# Patient Record
Sex: Female | Born: 1990 | Race: Black or African American | Hispanic: No | Marital: Single | Smoking: Current every day smoker
Health system: Southern US, Community
[De-identification: ages and names within clinical notes are randomized; demographics above are authoritative.]

## PROBLEM LIST (undated history)

## (undated) ENCOUNTER — Emergency Department (HOSPITAL_COMMUNITY): Admission: EM | Payer: Medicaid Other | Source: Home / Self Care

## (undated) DIAGNOSIS — F53 Postpartum depression: Secondary | ICD-10-CM

## (undated) DIAGNOSIS — R630 Anorexia: Secondary | ICD-10-CM

## (undated) DIAGNOSIS — K219 Gastro-esophageal reflux disease without esophagitis: Secondary | ICD-10-CM

## (undated) DIAGNOSIS — G43109 Migraine with aura, not intractable, without status migrainosus: Secondary | ICD-10-CM

## (undated) DIAGNOSIS — J309 Allergic rhinitis, unspecified: Secondary | ICD-10-CM

## (undated) DIAGNOSIS — Q892 Congenital malformations of other endocrine glands: Secondary | ICD-10-CM

## (undated) DIAGNOSIS — N83209 Unspecified ovarian cyst, unspecified side: Secondary | ICD-10-CM

## (undated) DIAGNOSIS — E559 Vitamin D deficiency, unspecified: Secondary | ICD-10-CM

## (undated) DIAGNOSIS — D649 Anemia, unspecified: Secondary | ICD-10-CM

## (undated) DIAGNOSIS — F419 Anxiety disorder, unspecified: Secondary | ICD-10-CM

## (undated) DIAGNOSIS — O99345 Other mental disorders complicating the puerperium: Secondary | ICD-10-CM

## (undated) HISTORY — DX: Congenital malformations of other endocrine glands: Q89.2

## (undated) HISTORY — DX: Anorexia: R63.0

## (undated) HISTORY — DX: Vitamin D deficiency, unspecified: E55.9

## (undated) HISTORY — DX: Anemia, unspecified: D64.9

## (undated) HISTORY — DX: Allergic rhinitis, unspecified: J30.9

## (undated) HISTORY — DX: Migraine with aura, not intractable, without status migrainosus: G43.109

## (undated) HISTORY — DX: Unspecified ovarian cyst, unspecified side: N83.209

## (undated) HISTORY — PX: WISDOM TOOTH EXTRACTION: SHX21

## (undated) HISTORY — DX: Gastro-esophageal reflux disease without esophagitis: K21.9

---
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
## 2012-04-15 ENCOUNTER — Emergency Department (HOSPITAL_COMMUNITY)
Admission: EM | Admit: 2012-04-15 | Discharge: 2012-04-16 | Disposition: A | Discharge status: Home / Self Care | Payer: Self-pay | Attending: Emergency Medicine | Admitting: Emergency Medicine

## 2012-04-15 ENCOUNTER — Emergency Department (HOSPITAL_COMMUNITY): Payer: Self-pay

## 2012-04-15 ENCOUNTER — Encounter (HOSPITAL_COMMUNITY): Payer: Self-pay | Admitting: Emergency Medicine

## 2012-04-15 DIAGNOSIS — S139XXA Sprain of joints and ligaments of unspecified parts of neck, initial encounter: Secondary | ICD-10-CM | POA: Insufficient documentation

## 2012-04-15 DIAGNOSIS — Y9389 Activity, other specified: Secondary | ICD-10-CM | POA: Insufficient documentation

## 2012-04-15 DIAGNOSIS — F17200 Nicotine dependence, unspecified, uncomplicated: Secondary | ICD-10-CM | POA: Insufficient documentation

## 2012-04-15 DIAGNOSIS — T07XXXA Unspecified multiple injuries, initial encounter: Secondary | ICD-10-CM | POA: Insufficient documentation

## 2012-04-15 DIAGNOSIS — Y92410 Unspecified street and highway as the place of occurrence of the external cause: Secondary | ICD-10-CM | POA: Insufficient documentation

## 2012-04-15 DIAGNOSIS — M436 Torticollis: Secondary | ICD-10-CM | POA: Insufficient documentation

## 2012-04-15 LAB — CBC WITH DIFFERENTIAL/PLATELET
Basophils Absolute: 0 10*3/uL (ref 0.0–0.1)
Basophils Relative: 0 % (ref 0–1)
HCT: 38.7 % (ref 36.0–46.0)
Lymphocytes Relative: 35 % (ref 12–46)
MCHC: 33.3 g/dL (ref 30.0–36.0)
Monocytes Absolute: 0.5 10*3/uL (ref 0.1–1.0)
Neutro Abs: 3.9 10*3/uL (ref 1.7–7.7)
Platelets: 182 10*3/uL (ref 150–400)
RDW: 14.8 % (ref 11.5–15.5)
WBC: 7.4 10*3/uL (ref 4.0–10.5)

## 2012-04-15 LAB — POCT I-STAT, CHEM 8
Calcium, Ion: 1.27 mmol/L — ABNORMAL HIGH (ref 1.12–1.23)
Chloride: 108 mEq/L (ref 96–112)
HCT: 40 % (ref 36.0–46.0)
Sodium: 144 mEq/L (ref 135–145)
TCO2: 26 mmol/L (ref 0–100)

## 2012-04-15 LAB — POCT PREGNANCY, URINE: Preg Test, Ur: NEGATIVE
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
## 2012-04-16 MED ORDER — HYDROCODONE-ACETAMINOPHEN 5-325 MG PO TABS
1.0000 | ORAL_TABLET | Freq: Once | ORAL | Status: AC
  Administered 2012-04-16: 1 via ORAL
  Filled 2012-04-16: qty 1

## 2012-04-16 MED ORDER — HYDROCODONE-ACETAMINOPHEN 5-325 MG PO TABS: 1.0000 | ORAL_TABLET | ORAL | Status: DC | PRN

## 2012-04-16 MED ORDER — CYCLOBENZAPRINE HCL 10 MG PO TABS
10.0000 mg | ORAL_TABLET | Freq: Once | ORAL | Status: AC
  Administered 2012-04-16: 10 mg via ORAL
  Filled 2012-04-16: qty 1

## 2012-04-16 MED ORDER — CYCLOBENZAPRINE HCL 10 MG PO TABS: 10.0000 mg | ORAL_TABLET | Freq: Three times a day (TID) | ORAL | Status: DC | PRN
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
## 2012-07-02 ENCOUNTER — Encounter (HOSPITAL_COMMUNITY): Payer: Self-pay | Admitting: Emergency Medicine

## 2012-07-02 ENCOUNTER — Emergency Department (HOSPITAL_COMMUNITY)
Admission: EM | Admit: 2012-07-02 | Discharge: 2012-07-02 | Disposition: A | Discharge status: Home / Self Care | Payer: Medicaid Other | Attending: Emergency Medicine | Admitting: Emergency Medicine

## 2012-07-02 DIAGNOSIS — N898 Other specified noninflammatory disorders of vagina: Secondary | ICD-10-CM | POA: Insufficient documentation

## 2012-07-02 DIAGNOSIS — A599 Trichomoniasis, unspecified: Secondary | ICD-10-CM

## 2012-07-02 DIAGNOSIS — R109 Unspecified abdominal pain: Secondary | ICD-10-CM

## 2012-07-02 DIAGNOSIS — F17200 Nicotine dependence, unspecified, uncomplicated: Secondary | ICD-10-CM | POA: Insufficient documentation

## 2012-07-02 DIAGNOSIS — Z3202 Encounter for pregnancy test, result negative: Secondary | ICD-10-CM | POA: Insufficient documentation

## 2012-07-02 DIAGNOSIS — A5900 Urogenital trichomoniasis, unspecified: Secondary | ICD-10-CM | POA: Insufficient documentation

## 2012-07-02 LAB — URINALYSIS, ROUTINE W REFLEX MICROSCOPIC
Bilirubin Urine: NEGATIVE
Ketones, ur: NEGATIVE mg/dL
Nitrite: NEGATIVE
Specific Gravity, Urine: 1.022 (ref 1.005–1.030)
Urobilinogen, UA: 0.2 mg/dL (ref 0.0–1.0)

## 2012-07-02 LAB — WET PREP, GENITAL
Clue Cells Wet Prep HPF POC: NONE SEEN
Yeast Wet Prep HPF POC: NONE SEEN

## 2012-07-02 MED ORDER — CEFTRIAXONE SODIUM 250 MG IJ SOLR
250.0000 mg | Freq: Once | INTRAMUSCULAR | Status: AC
  Administered 2012-07-02: 250 mg via INTRAMUSCULAR
  Filled 2012-07-02: qty 250

## 2012-07-02 MED ORDER — LIDOCAINE HCL (PF) 1 % IJ SOLN
INTRAMUSCULAR | Status: AC
  Administered 2012-07-02: 1 mL
  Filled 2012-07-02: qty 5

## 2012-07-02 MED ORDER — METRONIDAZOLE 500 MG PO TABS
2000.0000 mg | ORAL_TABLET | Freq: Once | ORAL | Status: AC
  Administered 2012-07-02: 2000 mg via ORAL
  Filled 2012-07-02: qty 4

## 2012-07-02 MED ORDER — AZITHROMYCIN 250 MG PO TABS
1000.0000 mg | ORAL_TABLET | Freq: Once | ORAL | Status: AC
  Administered 2012-07-02: 1000 mg via ORAL
  Filled 2012-07-02: qty 4

## 2012-07-02 NOTE — ED Notes (Signed)
Pt c/o left sided lower abd pain that is painful during intercourse; pt sts some white vaginal discharge and LMP was in April

## 2012-07-02 NOTE — ED Provider Notes (Signed)
History     CSN: [id number]  Arrival date & time 07/02/12  1445   First MD Initiated Contact with Patient 07/02/12 1905      Chief Complaint  Patient presents with  . Abdominal Pain  . Vaginal Discharge    (Consider location/radiation/quality/duration/timing/severity/associated sxs/prior treatment) HPI Comments: Patient is a 22 year old female who presents with a 10 day history of left lower abdominal discomfort. Symptoms started gradually and progressively worsened since the onset. The pain is aching and moderate without radiation. Intercourse makes the pain worse. Nothing makes the pain better. Patient reports associated vaginal discharge. LMP was 1 month ago. Patient reports a history of ovarian cysts and is concerned that is what might be causing her symptoms. Patient denies fever, NVD.    History reviewed. No pertinent past medical history.  History reviewed. No pertinent past surgical history.  History reviewed. No pertinent family history.  History  Substance Use Topics  . Smoking status: Current Every Day Smoker  . Smokeless tobacco: Not on file  . Alcohol Use: Yes     Comment: occ    OB History   Grav Para Term Preterm Abortions TAB SAB Ect Mult Living                  Review of Systems  Gastrointestinal: Positive for abdominal pain.  Genitourinary: Positive for vaginal discharge.  All other systems reviewed and are negative.    Allergies  Latex  Home Medications   Current Outpatient Rx  Name  Route  Sig  Dispense  Refill  . Prenatal Vit-Fe Fumarate-FA (PRENATAL MULTIVITAMIN) TABS   Oral   Take 1 tablet by mouth daily.           BP 105/77  Pulse 88  Temp(Src) 98.4 F (36.9 C) (Oral)  Resp 18  SpO2 100%  Physical Exam  Nursing note and vitals reviewed. Constitutional: She is oriented to person, place, and time. She appears well-developed and well-nourished. No distress.  HENT:  Head: Normocephalic and atraumatic.  Eyes: Conjunctivae are  normal.  Neck: Normal range of motion.  Cardiovascular: Normal rate and regular rhythm.  Exam reveals no gallop and no friction rub.   No murmur heard. Pulmonary/Chest: Effort normal and breath sounds normal. She has no wheezes. She has no rales. She exhibits no tenderness.  Abdominal: Soft. She exhibits no distension. There is no tenderness. There is no rebound and no guarding.  Genitourinary:  White milky vaginal discharge. No CMT. No adnexal tenderness to palpation. No abnormal masses palpation on bimanual exam.   Musculoskeletal: Normal range of motion.  Neurological: She is alert and oriented to person, place, and time. Coordination normal.  Speech is goal-oriented. Moves limbs without ataxia.   Skin: Skin is warm and dry.  Psychiatric: She has a normal mood and affect. Her behavior is normal.    ED Course  Procedures (including critical care time)  Labs Reviewed  WET PREP, GENITAL - Abnormal; Notable for the following:    Trich, Wet Prep TOO NUMEROUS TO COUNT (*)    WBC, Wet Prep HPF POC MODERATE (*)    All other components within normal limits  URINALYSIS, ROUTINE W REFLEX MICROSCOPIC - Abnormal; Notable for the following:    APPearance CLOUDY (*)    Leukocytes, UA LARGE (*)    All other components within normal limits  URINE MICROSCOPIC-ADD ON - Abnormal; Notable for the following:    Squamous Epithelial / LPF MANY (*)    All  other components within normal limits  URINE CULTURE  GC/CHLAMYDIA PROBE AMP  POCT PREGNANCY, URINE   No results found.   1. Trichomonas   2. Abdominal pain       MDM  8:19 PM Patient has trichomonas. I will treat patient for trichomonas, GC/Chlamydia. Patient will return tomorrow for pelvic US for left adnexal discomfort. Vitals stable and patient afebrile.         Emilia Beck, [place] 07/03/12 [phone]

## 2012-07-03 LAB — GC/CHLAMYDIA PROBE AMP: GC Probe RNA: NEGATIVE

## 2012-07-03 NOTE — ED Provider Notes (Signed)
Medical screening examination/treatment/procedure(s) were performed by non-physician practitioner and as supervising physician I was immediately available for consultation/collaboration.   Rolan Bucco, MD 07/03/12 1056

## 2012-07-04 LAB — URINE CULTURE

## 2012-07-06 NOTE — ED Notes (Signed)
Post ED Visit - Positive Culture Follow-up  Culture report reviewed by antimicrobial stewardship pharmacist: []  Wes Dulaney, Pharm.D., BCPS [x]  Celedonio Miyamoto, Pharm.D., BCPS []  Georgina Pillion, Pharm.D., BCPS []  [place], [place].D., BCPS, AAHIVP []  Estella Husk, Pharm.D., BCPS, AAHIV  Positive urine culture No further patient follow-up is required at this time.  Larena Sox 07/06/2012, 6:49 PM

## 2012-09-19 ENCOUNTER — Emergency Department (HOSPITAL_COMMUNITY)
Admission: EM | Admit: 2012-09-19 | Discharge: 2012-09-19 | Disposition: A | Discharge status: Home / Self Care | Payer: Medicaid Other | Attending: Emergency Medicine | Admitting: Emergency Medicine

## 2012-09-19 ENCOUNTER — Encounter (HOSPITAL_COMMUNITY): Payer: Self-pay | Admitting: Emergency Medicine

## 2012-09-19 DIAGNOSIS — R109 Unspecified abdominal pain: Secondary | ICD-10-CM | POA: Insufficient documentation

## 2012-09-19 DIAGNOSIS — Z91040 Latex allergy status: Secondary | ICD-10-CM | POA: Insufficient documentation

## 2012-09-19 DIAGNOSIS — N390 Urinary tract infection, site not specified: Secondary | ICD-10-CM | POA: Insufficient documentation

## 2012-09-19 DIAGNOSIS — F17200 Nicotine dependence, unspecified, uncomplicated: Secondary | ICD-10-CM | POA: Insufficient documentation

## 2012-09-19 DIAGNOSIS — N760 Acute vaginitis: Secondary | ICD-10-CM | POA: Insufficient documentation

## 2012-09-19 DIAGNOSIS — Z3202 Encounter for pregnancy test, result negative: Secondary | ICD-10-CM | POA: Insufficient documentation

## 2012-09-19 DIAGNOSIS — N898 Other specified noninflammatory disorders of vagina: Secondary | ICD-10-CM | POA: Insufficient documentation

## 2012-09-19 DIAGNOSIS — A499 Bacterial infection, unspecified: Secondary | ICD-10-CM | POA: Insufficient documentation

## 2012-09-19 DIAGNOSIS — N949 Unspecified condition associated with female genital organs and menstrual cycle: Secondary | ICD-10-CM | POA: Insufficient documentation

## 2012-09-19 DIAGNOSIS — B9689 Other specified bacterial agents as the cause of diseases classified elsewhere: Secondary | ICD-10-CM | POA: Insufficient documentation

## 2012-09-19 LAB — URINALYSIS, ROUTINE W REFLEX MICROSCOPIC
Bilirubin Urine: NEGATIVE
Hgb urine dipstick: NEGATIVE
Specific Gravity, Urine: 1.02 (ref 1.005–1.030)
pH: 6 (ref 5.0–8.0)

## 2012-09-19 LAB — WET PREP, GENITAL: Trich, Wet Prep: NONE SEEN

## 2012-09-19 LAB — POCT PREGNANCY, URINE: Preg Test, Ur: NEGATIVE

## 2012-09-19 LAB — URINE MICROSCOPIC-ADD ON

## 2012-09-19 MED ORDER — CEPHALEXIN 500 MG PO CAPS: 500.0000 mg | ORAL_CAPSULE | Freq: Three times a day (TID) | ORAL | Status: DC

## 2012-09-19 MED ORDER — METRONIDAZOLE 500 MG PO TABS: 500.0000 mg | ORAL_TABLET | Freq: Three times a day (TID) | ORAL | Status: DC

## 2012-09-19 MED ORDER — HYDROCODONE-ACETAMINOPHEN 5-325 MG PO TABS: 1.0000 | ORAL_TABLET | Freq: Four times a day (QID) | ORAL | Status: DC | PRN

## 2012-09-19 NOTE — ED Notes (Signed)
Pt sts right sided lower abd pain that thinks is ovarian cyst; pt sts hx of same; pt sts LMP was in July

## 2012-09-19 NOTE — ED Provider Notes (Signed)
CSN: [id number]     Arrival date & time 09/19/12  1447 History     First MD Initiated Contact with Patient 09/19/12 1711     Chief Complaint  Patient presents with  . Ovarian Cyst   (Consider location/radiation/quality/duration/timing/severity/associated sxs/prior Treatment) HPI Lisa Holt is a 22 y.o. female who presents to ED with complaint of lower abdominal pain for several months. States pain is intermittent. Was seen for the same 2 months ago. Was told had infection and possible cyst. She was offered [place] to make sure this wasn't a cyst, but she refused at that time. States pain persistent since. States worse with intercourse. No vaginal discharge or bleeding, no urinary symptoms. States had trichomonas, but both her and her partner were treated. Pt denies fever, chills, nausea, vomiting, diarrhea. Denies being pregnant.   History reviewed. No pertinent past medical history. History reviewed. No pertinent past surgical history. History reviewed. No pertinent family history. History  Substance Use Topics  . Smoking status: Current Every Day Smoker  . Smokeless tobacco: Not on file  . Alcohol Use: Yes     Comment: occ   OB History   Grav Para Term Preterm Abortions TAB SAB Ect Mult Living                 Review of Systems  Constitutional: Negative for fever and chills.  HENT: Negative for neck pain and neck stiffness.   Gastrointestinal: Positive for abdominal pain. Negative for nausea, vomiting and diarrhea.  Genitourinary: Positive for vaginal pain and pelvic pain. Negative for dysuria, frequency, flank pain, vaginal bleeding, vaginal discharge and genital sores.  Neurological: Negative for dizziness, weakness and headaches.  All other systems reviewed and are negative.    Allergies  Latex  Home Medications  No current outpatient prescriptions on file. BP 101/68  Pulse 78  Temp(Src) 97.7 F (36.5 C) (Oral)  Resp 16  Ht 5\' 3"  (1.6 m)  Wt 90 lb (40.824 kg)   BMI 15.95 kg/m2  SpO2 100% Physical Exam  Nursing note and vitals reviewed. Constitutional: She appears well-developed and well-nourished. No distress.  Eyes: Conjunctivae are normal.  Neck: Neck supple.  Cardiovascular: Normal rate, regular rhythm and normal heart sounds.   Pulmonary/Chest: Effort normal and breath sounds normal. No respiratory distress. She has no wheezes. She has no rales.  Abdominal: Soft. Bowel sounds are normal. She exhibits no distension. There is no tenderness. There is no rebound and no guarding.  Genitourinary:  Normal external genitalia. White thin vaginal discharge. No CMT. No uterine or adnexal tenderness  Skin: Skin is warm and dry.    ED Course   Procedures (including critical care time)  Labs Reviewed  WET PREP, GENITAL - Abnormal; Notable for the following:    Clue Cells Wet Prep HPF POC MANY (*)    WBC, Wet Prep HPF POC FEW (*)    All other components within normal limits  URINALYSIS, ROUTINE W REFLEX MICROSCOPIC - Abnormal; Notable for the following:    APPearance HAZY (*)    Leukocytes, UA MODERATE (*)    All other components within normal limits  URINE MICROSCOPIC-ADD ON - Abnormal; Notable for the following:    Squamous Epithelial / LPF FEW (*)    Bacteria, UA MANY (*)    All other components within normal limits  URINE CULTURE  GC/CHLAMYDIA PROBE AMP  POCT PREGNANCY, URINE   No results found.  1. UTI (lower urinary tract infection)   2. Bacterial vaginosis  MDM  Pt with pelvic pain. No significant tenderness on exam today. Pt in no distress. Records reviewed. Pt was here two months ago, at that time, was diagnosed with STI, treated for trichomonas, and possible GC/chlamydia. Pt's GC/Chlamydia cultures returned negative, however, her UA cultures came back possible for infection. She states she was not called so was never treated. Pelvic unremarkable. Many clue cells on wet prep. Will treat with flagyl and keflex for UTI. Given pain  for 2 months, only worsened with intercourse, no current tenderness doubt ovarian torsion.   Filed Vitals:   09/19/12 1510 09/19/12 1706 09/19/12 1849  BP: 96/65 101/68 94/63  Pulse: 97 78 83  Temp: 97.7 F (36.5 C)  97.9 F (36.6 C)  TempSrc: Oral  Oral  Resp: 18 16 16   Height: 5\' 3"  (1.6 m)    Weight: 90 lb (40.824 kg)    SpO2: 100% 100% 98%     Lottie Mussel, PA-C 09/19/12 1914

## 2012-09-20 NOTE — ED Provider Notes (Signed)
Medical screening examination/treatment/procedure(s) were performed by non-physician practitioner and as supervising physician I was immediately available for consultation/collaboration.   Quinteria Chisum, MD 09/20/12 1802 

## 2012-09-21 LAB — URINE CULTURE

## 2012-09-21 LAB — GC/CHLAMYDIA PROBE AMP
CT Probe RNA: NEGATIVE
GC Probe RNA: POSITIVE — AB

## 2012-09-22 ENCOUNTER — Telehealth (HOSPITAL_COMMUNITY): Payer: Self-pay | Admitting: Emergency Medicine

## 2012-09-22 NOTE — ED Notes (Signed)
+  Gonorrhea. Chart sent to EDP office for review. [employer]HS attached. °

## 2012-09-22 NOTE — ED Notes (Signed)
Patient has +Gonorrhea. °

## 2012-09-22 NOTE — ED Notes (Signed)
Post ED Visit - Positive Culture Follow-up  Culture report reviewed by antimicrobial stewardship pharmacist: []  Wes Dulaney, Pharm.D., BCPS []  Celedonio Miyamoto, Pharm.D., BCPS []  Georgina Pillion, Pharm.D., BCPS []  [place], [address].D., BCPS, AAHIVP []  Estella Husk, Pharm.D., BCPS, AAHIVP [x]  Okey Regal, Pharm.D.  Positive urine culture Treated with Keflex, organism sensitive to the same and no further patient follow-up is required at this time.  Lisa Holt 09/22/2012, 12:50 PM

## 2012-09-30 ENCOUNTER — Telehealth (HOSPITAL_COMMUNITY): Payer: Self-pay | Admitting: Emergency Medicine

## 2012-11-14 ENCOUNTER — Emergency Department (HOSPITAL_COMMUNITY)
Admission: EM | Admit: 2012-11-14 | Discharge: 2012-11-14 | Disposition: A | Discharge status: Home / Self Care | Payer: Medicaid Other | Attending: Emergency Medicine | Admitting: Emergency Medicine

## 2012-11-14 ENCOUNTER — Emergency Department (HOSPITAL_COMMUNITY): Payer: Medicaid Other

## 2012-11-14 ENCOUNTER — Encounter (HOSPITAL_COMMUNITY): Payer: Self-pay | Admitting: Physical Medicine and Rehabilitation

## 2012-11-14 DIAGNOSIS — O99330 Smoking (tobacco) complicating pregnancy, unspecified trimester: Secondary | ICD-10-CM | POA: Insufficient documentation

## 2012-11-14 DIAGNOSIS — Z91040 Latex allergy status: Secondary | ICD-10-CM | POA: Insufficient documentation

## 2012-11-14 DIAGNOSIS — O9989 Other specified diseases and conditions complicating pregnancy, childbirth and the puerperium: Secondary | ICD-10-CM | POA: Insufficient documentation

## 2012-11-14 DIAGNOSIS — N898 Other specified noninflammatory disorders of vagina: Secondary | ICD-10-CM | POA: Insufficient documentation

## 2012-11-14 DIAGNOSIS — B379 Candidiasis, unspecified: Secondary | ICD-10-CM | POA: Insufficient documentation

## 2012-11-14 DIAGNOSIS — B373 Candidiasis of vulva and vagina: Secondary | ICD-10-CM

## 2012-11-14 DIAGNOSIS — R109 Unspecified abdominal pain: Secondary | ICD-10-CM | POA: Insufficient documentation

## 2012-11-14 DIAGNOSIS — Z3490 Encounter for supervision of normal pregnancy, unspecified, unspecified trimester: Secondary | ICD-10-CM

## 2012-11-14 LAB — URINE MICROSCOPIC-ADD ON

## 2012-11-14 LAB — POCT PREGNANCY, URINE: Preg Test, Ur: POSITIVE — AB

## 2012-11-14 LAB — WET PREP, GENITAL
Clue Cells Wet Prep HPF POC: NONE SEEN
Trich, Wet Prep: NONE SEEN

## 2012-11-14 LAB — URINALYSIS, ROUTINE W REFLEX MICROSCOPIC
Glucose, UA: NEGATIVE mg/dL
Specific Gravity, Urine: 1.031 — ABNORMAL HIGH (ref 1.005–1.030)
Urobilinogen, UA: 1 mg/dL (ref 0.0–1.0)

## 2012-11-14 MED ORDER — MICONAZOLE NITRATE 2 % VA CREA: 1.0000 | TOPICAL_CREAM | Freq: Every day | VAGINAL | Status: DC

## 2012-11-14 MED ORDER — PRENATAL COMPLETE 14-0.4 MG PO TABS: 1.0000 | ORAL_TABLET | Freq: Every day | ORAL | Status: DC

## 2012-11-14 NOTE — ED Notes (Signed)
PA at bedside with EMT doing pelvic exam

## 2012-11-14 NOTE — ED Notes (Signed)
Pt presents to department for evaluation of abdominal pain, back pain and thick colored vaginal discharge. Ongoing for several days. LMP: 09/21/2012. States she could be pregnant. No nausea/vomiting. 8/10 pain at the time. NAD.

## 2012-11-14 NOTE — ED Notes (Signed)
Phlebotomy at bedside.

## 2012-11-14 NOTE — ED Provider Notes (Signed)
CSN: [id number]     Arrival date & time 11/14/12  1214 History   First MD Initiated Contact with Patient 11/14/12 1410     Chief Complaint  Patient presents with  . Abdominal Pain  . Vaginal Discharge   (Consider location/radiation/quality/duration/timing/severity/associated sxs/prior Treatment) HPI Comments: Patient is a 22 year old female with no past medical history who presents with a 3 day history of abdominal pain. The pain is located in her lower abdomen and radiates to her back. The pain is described as cramping and severe. The pain started gradually and progressively worsened since the onset. No alleviating/aggravating factors. The patient has tried nothing for symptoms without relief. Associated symptoms include vaginal discharge and vaginal spotting. Patient denies fever, headache, NVD, chest pain, SOB, dysuria, constipation. LMP 09/21/2012     No past medical history on file. History reviewed. No pertinent past surgical history. No family history on file. History  Substance Use Topics  . Smoking status: Current Every Day Smoker  . Smokeless tobacco: Not on file  . Alcohol Use: Yes     Comment: occ   OB History   Grav Para Term Preterm Abortions TAB SAB Ect Mult Living                 Review of Systems  Gastrointestinal: Positive for abdominal pain.  Genitourinary: Positive for vaginal discharge.  All other systems reviewed and are negative.    Allergies  Latex  Home Medications  No current outpatient prescriptions on file. BP 103/61  Pulse 81  Temp(Src) 98.5 F (36.9 C) (Oral)  Resp 16  Ht 5\' 3"  (1.6 m)  Wt 103 lb 6.4 oz (46.902 kg)  BMI 18.32 kg/m2  SpO2 99% Physical Exam  Nursing note and vitals reviewed. Constitutional: She is oriented to person, place, and time. She appears well-developed and well-nourished. No distress.  HENT:  Head: Normocephalic and atraumatic.  Eyes: Conjunctivae are normal.  Neck: Normal range of motion.  Cardiovascular:  Normal rate and regular rhythm.  Exam reveals no gallop and no friction rub.   No murmur heard. Pulmonary/Chest: Effort normal and breath sounds normal. She has no wheezes. She has no rales. She exhibits no tenderness.  Abdominal: Soft. She exhibits no distension. There is tenderness. There is no rebound and no guarding.  Mild generalized lower abdominal tenderness to palpation that does not lateralize. No peritoneal signs or focal tenderness to palpation.   Genitourinary: Vagina normal.  External genitalia normal. Copious thick, white/greenish vaginal discharge. No CMT. Cervical os closed. No masses or tenderness to palpation on bimanual exam.   Musculoskeletal: Normal range of motion.  Neurological: She is alert and oriented to person, place, and time. Coordination normal.  Speech is goal-oriented. Moves limbs without ataxia.   Skin: Skin is warm and dry.  Psychiatric: She has a normal mood and affect. Her behavior is normal.    ED Course  Procedures (including critical care time) Labs Review Labs Reviewed  URINALYSIS, ROUTINE W REFLEX MICROSCOPIC - Abnormal; Notable for the following:    Color, Urine AMBER (*)    APPearance CLOUDY (*)    Specific Gravity, Urine 1.031 (*)    Hgb urine dipstick TRACE (*)    Bilirubin Urine SMALL (*)    Ketones, ur 15 (*)    Protein, ur 30 (*)    Leukocytes, UA LARGE (*)    All other components within normal limits  URINE MICROSCOPIC-ADD ON - Abnormal; Notable for the following:    Squamous  Epithelial / LPF MANY (*)    Bacteria, UA MANY (*)    All other components within normal limits  POCT PREGNANCY, URINE - Abnormal; Notable for the following:    Preg Test, Ur POSITIVE (*)    All other components within normal limits  URINE CULTURE   Imaging Review US Ob Comp Less 14 Wks  11/14/2012   CLINICAL DATA:  Pain, vaginal discharge, pregnant, quantitative beta HCG 4,123  EXAM: OBSTETRIC <14 WK [place] AND TRANSVAGINAL OB US  TECHNIQUE: Both transabdominal  and transvaginal ultrasound examinations were performed for complete evaluation of the gestation as well as the maternal uterus, adnexal regions, and pelvic cul-de-sac. Transvaginal technique was performed to assess early pregnancy.  COMPARISON:  Pelvic ultrasound 03/29/2011  FINDINGS: Intrauterine gestational sac: Visualized/normal in shape.  Yolk sac:  Not visualized  Embryo:  Not visualized  Cardiac Activity: N/A  Heart Rate:  N/A bpm  MSD:  5.6  mm   5 w   2  d         [place] EDC: 07/15/2013  Maternal uterus/adnexae:  No subchorionic hemorrhage.  Left ovary normal size and morphology, 1.5 x 1.2 x 2.9 cm.  Right ovary measures 3.8 x 2.3 x 2.1 cm and contains a 2.1 x 1.5 x 1.5 cm hemorrhagic corpus luteum.  No additional adnexal masses.  Trace free pelvic fluid.  IMPRESSION: Gestational sac identified within the uterus, with mean sac diameter corresponding to 5 weeks 2 days EGA.  No acute abnormalities identified.  Due non visualization of fetal pole, unable to establish viability; may establish viability by followup ultrasound in 14 days.   Electronically Signed   By: Ulyses Southward M.D.   On: 11/14/2012 16:20   US Ob Transvaginal  11/14/2012   CLINICAL DATA:  Pain, vaginal discharge, pregnant, quantitative beta HCG 4,123  EXAM: OBSTETRIC <14 WK [place] AND TRANSVAGINAL OB US  TECHNIQUE: Both transabdominal and transvaginal ultrasound examinations were performed for complete evaluation of the gestation as well as the maternal uterus, adnexal regions, and pelvic cul-de-sac. Transvaginal technique was performed to assess early pregnancy.  COMPARISON:  Pelvic ultrasound 03/29/2011  FINDINGS: Intrauterine gestational sac: Visualized/normal in shape.  Yolk sac:  Not visualized  Embryo:  Not visualized  Cardiac Activity: N/A  Heart Rate:  N/A bpm  MSD:  5.6  mm   5 w   2  d         [place] EDC: 07/15/2013  Maternal uterus/adnexae:  No subchorionic hemorrhage.  Left ovary normal size and morphology, 1.5 x 1.2 x 2.9 cm.  Right ovary  measures 3.8 x 2.3 x 2.1 cm and contains a 2.1 x 1.5 x 1.5 cm hemorrhagic corpus luteum.  No additional adnexal masses.  Trace free pelvic fluid.  IMPRESSION: Gestational sac identified within the uterus, with mean sac diameter corresponding to 5 weeks 2 days EGA.  No acute abnormalities identified.  Due non visualization of fetal pole, unable to establish viability; may establish viability by followup ultrasound in 14 days.   Electronically Signed   By: Ulyses Southward M.D.   On: 11/14/2012 16:20    MDM   1. Pregnancy   2. Yeast infection of the vagina     2:45 PM Urine pregnancy positive. Urinalysis unremarkable. Patient will have basic labs and hcg quant and pelvic US. Vitals stable and patient afebrile.   Wet prep shows yeast. Pelvic US shows IUP at 5 weeks and 2 days. Patient will have prescription for prenatal vitamins  and vaginal miconazole for yeast infection. Patient instructed to follow up at [facility] outpatient clinic for OBGYN care. Vitals stable and patient afebrile.     Emilia Beck, PA-C 11/14/12 1825

## 2012-11-15 LAB — GC/CHLAMYDIA PROBE AMP: CT Probe RNA: NEGATIVE

## 2012-11-15 LAB — URINE CULTURE

## 2012-11-25 NOTE — ED Provider Notes (Signed)
Medical screening examination/treatment/procedure(s) were performed by non-physician practitioner and as supervising physician I was immediately available for consultation/collaboration.   Raini Tiley J Aryan Sparks, MD 11/25/12 0816 

## 2013-01-23 ENCOUNTER — Ambulatory Visit (INDEPENDENT_AMBULATORY_CARE_PROVIDER_SITE_OTHER): Payer: Medicaid Other | Admitting: Family Medicine

## 2013-01-23 ENCOUNTER — Encounter: Payer: Self-pay | Admitting: Family Medicine

## 2013-01-23 VITALS — BP 89/57 | Temp 97.5°F | Wt 104.3 lb

## 2013-01-23 DIAGNOSIS — F1920 Other psychoactive substance dependence, uncomplicated: Secondary | ICD-10-CM

## 2013-01-23 DIAGNOSIS — Z3401 Encounter for supervision of normal first pregnancy, first trimester: Secondary | ICD-10-CM

## 2013-01-23 DIAGNOSIS — Z23 Encounter for immunization: Secondary | ICD-10-CM

## 2013-01-23 LAB — POCT URINALYSIS DIP (DEVICE)
Bilirubin Urine: NEGATIVE
Hgb urine dipstick: NEGATIVE
Ketones, ur: NEGATIVE mg/dL
Leukocytes, UA: NEGATIVE
Protein, ur: NEGATIVE mg/dL
Specific Gravity, Urine: 1.025 (ref 1.005–1.030)
pH: 6.5 (ref 5.0–8.0)

## 2013-01-23 NOTE — Addendum Note (Signed)
Addended by: Louanna Raw on: 01/23/2013 01:13 PM   Modules accepted: Orders

## 2013-01-23 NOTE — Progress Notes (Signed)
P= 100,  Here for Initial ob. Given new patient information. Discussed bmi/appropriate weight gain. Not sure of LMP dates. C/o pain in abdomen sometime, and headaches.

## 2013-01-23 NOTE — Patient Instructions (Addendum)
Second Trimester of Pregnancy The second trimester is from week 13 through week 28, months 4 through 6. The second trimester is often a time when you feel your best. Your body has also adjusted to being pregnant, and you begin to feel better physically. Usually, morning sickness has lessened or quit completely, you may have more energy, and you may have an increase in appetite. The second trimester is also a time when the fetus is growing rapidly. At the end of the sixth month, the fetus is about 9 inches long and weighs about 1 pounds. You will likely begin to feel the baby move (quickening) between 18 and 20 weeks of the pregnancy. BODY CHANGES Your body goes through many changes during pregnancy. The changes vary from woman to woman.   Your weight will continue to increase. You will notice your lower abdomen bulging out.  You may begin to get stretch marks on your hips, abdomen, and breasts.  You may develop headaches that can be relieved by medicines approved by your caregiver.  You may urinate more often because the fetus is pressing on your bladder.  You may develop or continue to have heartburn as a result of your pregnancy.  You may develop constipation because certain hormones are causing the muscles that push waste through your intestines to slow down.  You may develop hemorrhoids or swollen, bulging veins (varicose veins).  You may have back pain because of the weight gain and pregnancy hormones relaxing your joints between the bones in your pelvis and as a result of a shift in weight and the muscles that support your balance.  Your breasts will continue to grow and be tender.  Your gums may bleed and may be sensitive to brushing and flossing.  Dark spots or blotches (chloasma, mask of pregnancy) may develop on your face. This will likely fade after the baby is born.  A dark line from your belly button to the pubic area (linea nigra) may appear. This will likely fade after the  baby is born. WHAT TO EXPECT AT YOUR PRENATAL VISITS During a routine prenatal visit:  You will be weighed to make sure you and the fetus are growing normally.  Your blood pressure will be taken.  Your abdomen will be measured to track your baby's growth.  The fetal heartbeat will be listened to.  Any test results from the previous visit will be discussed. Your caregiver may ask you:  How you are feeling.  If you are feeling the baby move.  If you have had any abnormal symptoms, such as leaking fluid, bleeding, severe headaches, or abdominal cramping.  If you have any questions. Other tests that may be performed during your second trimester include:  Blood tests that check for:  Low iron levels (anemia).  Gestational diabetes (between 24 and 28 weeks).  Rh antibodies.  Urine tests to check for infections, diabetes, or protein in the urine.  An ultrasound to confirm the proper growth and development of the baby.  An amniocentesis to check for possible genetic problems.  Fetal screens for spina bifida and Down syndrome. HOME CARE INSTRUCTIONS   Avoid all smoking, herbs, alcohol, and unprescribed drugs. These chemicals affect the formation and growth of the baby.  Follow your caregiver's instructions regarding medicine use. There are medicines that are either safe or unsafe to take during pregnancy.  Exercise only as directed by your caregiver. Experiencing uterine cramps is a good sign to stop exercising.  Continue to eat regular,   healthy meals.  Wear a good support bra for breast tenderness.  Do not use hot tubs, steam rooms, or saunas.  Wear your seat belt at all times when driving.  Avoid raw meat, uncooked cheese, cat litter boxes, and soil used by cats. These carry germs that can cause birth defects in the baby.  Take your prenatal vitamins.  Try taking a stool softener (if your caregiver approves) if you develop constipation. Eat more high-fiber foods,  such as fresh vegetables or fruit and whole grains. Drink plenty of fluids to keep your urine clear or pale yellow.  Take warm sitz baths to soothe any pain or discomfort caused by hemorrhoids. Use hemorrhoid cream if your caregiver approves.  If you develop varicose veins, wear support hose. Elevate your feet for 15 minutes, 3 4 times a day. Limit salt in your diet.  Avoid heavy lifting, wear low heel shoes, and practice good posture.  Rest with your legs elevated if you have leg cramps or low back pain.  Visit your dentist if you have not gone yet during your pregnancy. Use a soft toothbrush to brush your teeth and be gentle when you floss.  A sexual relationship may be continued unless your caregiver directs you otherwise.  Continue to go to all your prenatal visits as directed by your caregiver. SEEK MEDICAL CARE IF:   You have dizziness.  You have mild pelvic cramps, pelvic pressure, or nagging pain in the abdominal area.  You have persistent nausea, vomiting, or diarrhea.  You have a bad smelling vaginal discharge.  You have pain with urination. SEEK IMMEDIATE MEDICAL CARE IF:   You have a fever.  You are leaking fluid from your vagina.  You have spotting or bleeding from your vagina.  You have severe abdominal cramping or pain.  You have rapid weight gain or loss.  You have shortness of breath with chest pain.  You notice sudden or extreme swelling of your face, hands, ankles, feet, or legs.  You have not felt your baby move in over an hour.  You have severe headaches that do not go away with medicine.  You have vision changes. Document Released: 02/01/2001 Document Revised: 10/10/2012 Document Reviewed: 04/10/2012 [employer] Patient Information 2014 [employer].  

## 2013-01-23 NOTE — Progress Notes (Signed)
Subjective:    Lisa Holt is a G1P0 [redacted]w[redacted]d being seen today for her first obstetrical visit.  Patient was seen in the ED for vaginal bleeding and discharge and was diagnosed with IUP  On 11/14/12. Pt dated by that [place].  This was not a planned pregnancy.  Her obstetrical history is significant for tobacco abuse and marijuana. Patient does intend to breast feed. Pregnancy history fully reviewed.  Patient reports fatigue and nausea. having some headaches from time to time.Marland Kitchenable to keep foods down most of the time.  Some foods don't stay down.   Filed Vitals:   01/23/13 1009  BP: 89/57  Temp: 97.5 F (36.4 C)  Weight: 104 lb 4.8 oz (47.31 kg)    HISTORY: OB History  Gravida Para Term Preterm AB SAB TAB Ectopic Multiple Living  1             # Outcome Date GA Lbr Len/2nd Weight Sex Delivery Anes PTL Lv  1 CUR              History reviewed. No pertinent past medical history. History reviewed. No pertinent past surgical history. Family History  Problem Relation Age of Onset  . Hypertension Father   . Diabetes Father   . Stroke Father    History   Social History  . Marital Status: Single    Spouse Name: N/A    Number of Children: N/A  . Years of Education: N/A   Occupational History  . Not on file.   Social History Main Topics  . Smoking status: Current Every Day Smoker -- 0.50 packs/day    Types: Cigarettes  . Smokeless tobacco: Not on file  . Alcohol Use: Yes     Comment: occ- stopped with pregnancy  . Drug Use: Yes    Special: Marijuana  . Sexual Activity: Yes    Birth Control/ Protection: None   Other Topics Concern  . Not on file   Social History Narrative  . No narrative on file     Exam    Uterus:   appropriate for dates   Pelvic Exam: Deferred as done in the ED  System:     Skin: normal coloration and turgor, no rashes    Neurologic: oriented, normal, normal mood, gait normal; reflexes normal and symmetric   Extremities: normal strength, tone,  and muscle mass   HEENT PERRLA, extra ocular movement intact, sclera clear, anicteric and oropharynx clear, no lesions   Mouth/Teeth mucous membranes moist, pharynx normal without lesions   Neck supple   Cardiovascular: regular rate and rhythm, no murmurs or gallops   Respiratory:  appears well, vitals normal, no respiratory distress, acyanotic, normal RR, ear and throat exam is normal,  chest clear, no wheezing, crepitations, rhonchi, normal symmetric air entry   Abdomen: soft, non-tender; bowel sounds normal; no masses,  no organomegaly   Urinary: deferred      Assessment:    Pregnancy: G1P0 Patient Active Problem List   Diagnosis Date Noted  . Supervision of normal first pregnancy in first trimester 01/23/2013        Plan:     Initial labs drawn. Prenatal vitamins to pharmacy Problem list reviewed and updated. Urged to quit smoking and MJN- already cutting back on both Genetic Screening discussed Quad Screen: to be discussed next time as too late for first screen and slightly early for quad.  Ultrasound discussed; fetal survey: to be requested at next visit.  Follow up in 4 weeks.  50% of 30 min visit spent on counseling and coordination of care.    Dinero Chavira L 01/23/2013

## 2013-01-24 LAB — OBSTETRIC PANEL
Antibody Screen: NEGATIVE
Basophils Absolute: 0 10*3/uL (ref 0.0–0.1)
Basophils Relative: 0 % (ref 0–1)
Eosinophils Relative: 2 % (ref 0–5)
HCT: 30.5 % — ABNORMAL LOW (ref 36.0–46.0)
Hemoglobin: 10.7 g/dL — ABNORMAL LOW (ref 12.0–15.0)
MCHC: 35.1 g/dL (ref 30.0–36.0)
MCV: 79.8 fL (ref 78.0–100.0)
Monocytes Absolute: 0.6 10*3/uL (ref 0.1–1.0)
Monocytes Relative: 6 % (ref 3–12)
Neutro Abs: 6.7 10*3/uL (ref 1.7–7.7)
RDW: 14.7 % (ref 11.5–15.5)
Rh Type: POSITIVE
Rubella: 0.7 Index (ref <0.90)

## 2013-01-24 LAB — ALCOHOL METABOLITE (ETG), URINE: Ethyl Glucuronide (EtG): NEGATIVE ng/mL

## 2013-01-25 LAB — PRESCRIPTION MONITORING PROFILE (19 PANEL)
Barbiturate Screen, Urine: NEGATIVE ng/mL
Benzodiazepine Screen, Urine: NEGATIVE ng/mL
Carisoprodol, Urine: NEGATIVE ng/mL
Creatinine, Urine: 140.19 mg/dL (ref >20.0)
MDMA URINE: NEGATIVE ng/mL
Meperidine, Ur: NEGATIVE ng/mL
Methadone Screen, Urine: NEGATIVE ng/mL
Nitrites, Initial: NEGATIVE ug/mL
Oxycodone Screen, Ur: NEGATIVE ng/mL
Phencyclidine, Ur: NEGATIVE ng/mL
Propoxyphene: NEGATIVE ng/mL
Tramadol Scrn, Ur: NEGATIVE ng/mL
Zolpidem, Urine: NEGATIVE ng/mL
pH, Initial: 6.7 pH (ref 4.5–8.9)

## 2013-01-25 LAB — HEMOGLOBINOPATHY EVALUATION
Hgb A2 Quant: 2.4 % (ref 2.2–3.2)
Hgb A: 97.6 % (ref 96.8–97.8)

## 2013-01-25 LAB — CULTURE, OB URINE: Organism ID, Bacteria: NO GROWTH

## 2013-01-25 LAB — CANNABANOIDS (GC/LC/MS), URINE: THC-COOH (GC/LC/MS), ur confirm: 60 ng/mL — AB

## 2013-01-29 ENCOUNTER — Encounter: Payer: Self-pay | Admitting: Family Medicine

## 2013-01-29 DIAGNOSIS — F1210 Cannabis abuse, uncomplicated: Secondary | ICD-10-CM | POA: Insufficient documentation

## 2013-02-20 ENCOUNTER — Encounter: Payer: Medicaid Other | Admitting: Family Medicine

## 2013-02-21 NOTE — L&D Delivery Note (Signed)
Delivery Note At 1:08 PM a viable female was delivered via  (Presentation:ROA) w/ double nuchal cord & body cord that was delivered through and reduced immediately after birth.  Vigorous infant placed directly on mom's abdomen for bonding/skin-to-skin. Cord allowed to stop pulsing, and was clamped x 2, and cut by fob and pt's mom simutaneously.  APGAR: pending; weight: pending .   Placenta status: delivered spontaneously, intact.  Cord:  3VC, with the following complications: none.   Anesthesia: Epidural  Episiotomy: n/a Lacerations: bilateral hemostatic periurethral, not requiring repair Suture Repair: n/a Est. Blood Loss (mL): 350  Mom to postpartum.  Baby to Couplet care / Skin to Skin. Plans to breastfeed, nexplanon for contraception  Marge Duncans 07/20/2013, 1:31 PM

## 2013-03-12 ENCOUNTER — Ambulatory Visit (INDEPENDENT_AMBULATORY_CARE_PROVIDER_SITE_OTHER): Payer: Medicaid Other | Admitting: Obstetrics and Gynecology

## 2013-03-12 VITALS — BP 83/56 | Temp 98.0°F | Wt 112.2 lb

## 2013-03-12 DIAGNOSIS — K5900 Constipation, unspecified: Secondary | ICD-10-CM

## 2013-03-12 DIAGNOSIS — O99320 Drug use complicating pregnancy, unspecified trimester: Secondary | ICD-10-CM

## 2013-03-12 DIAGNOSIS — F1920 Other psychoactive substance dependence, uncomplicated: Secondary | ICD-10-CM

## 2013-03-12 DIAGNOSIS — Z3401 Encounter for supervision of normal first pregnancy, first trimester: Secondary | ICD-10-CM

## 2013-03-12 LAB — POCT URINALYSIS DIP (DEVICE)
BILIRUBIN URINE: NEGATIVE
GLUCOSE, UA: NEGATIVE mg/dL
HGB URINE DIPSTICK: NEGATIVE
Ketones, ur: NEGATIVE mg/dL
Nitrite: NEGATIVE
PH: 7 (ref 5.0–8.0)
Protein, ur: NEGATIVE mg/dL
SPECIFIC GRAVITY, URINE: 1.015 (ref 1.005–1.030)
Urobilinogen, UA: 0.2 mg/dL (ref 0.0–1.0)

## 2013-03-12 MED ORDER — DOCUSATE SODIUM 100 MG PO CAPS: 100.0000 mg | ORAL_CAPSULE | Freq: Every day | ORAL | Status: DC | PRN

## 2013-03-12 NOTE — Progress Notes (Signed)
Constipation and H/As discussed. Rx Colace and diet reviewed. Try Tylenol. No more VB.  Anatomy US asap. Needs MPW and Babylove.

## 2013-03-12 NOTE — Progress Notes (Signed)
P=87,  

## 2013-03-12 NOTE — Patient Instructions (Signed)

## 2013-03-14 ENCOUNTER — Other Ambulatory Visit (HOSPITAL_COMMUNITY): Payer: Medicaid Other

## 2013-03-14 ENCOUNTER — Telehealth: Payer: Self-pay | Admitting: General Practice

## 2013-03-14 DIAGNOSIS — O99619 Diseases of the digestive system complicating pregnancy, unspecified trimester: Principal | ICD-10-CM

## 2013-03-14 DIAGNOSIS — K5900 Constipation, unspecified: Secondary | ICD-10-CM

## 2013-03-14 MED ORDER — DOCUSATE SODIUM 100 MG PO CAPS: 100.0000 mg | ORAL_CAPSULE | Freq: Every day | ORAL | Status: DC | PRN

## 2013-03-14 NOTE — Telephone Encounter (Signed)
Patient called and left message that the [facility] [address] does not have her colace rx and she really needs this called in. Called patient, no answer- left message that we are trying to return your phone call, we have called in your medication to your [facility] and it is available for you to pick up now and if you have any additional questions or concerns please call us back at the clinics

## 2013-03-21 ENCOUNTER — Ambulatory Visit (HOSPITAL_COMMUNITY)
Admission: RE | Admit: 2013-03-21 | Discharge: 2013-03-21 | Disposition: A | Discharge status: Home / Self Care | Payer: Medicaid Other | Source: Ambulatory Visit | Attending: Obstetrics and Gynecology | Admitting: Obstetrics and Gynecology

## 2013-03-21 DIAGNOSIS — Z3401 Encounter for supervision of normal first pregnancy, first trimester: Secondary | ICD-10-CM

## 2013-03-21 DIAGNOSIS — Z3689 Encounter for other specified antenatal screening: Secondary | ICD-10-CM | POA: Insufficient documentation

## 2013-04-02 ENCOUNTER — Other Ambulatory Visit: Payer: Self-pay

## 2013-04-02 MED ORDER — PROMETHAZINE HCL 25 MG PO TABS: 25.0000 mg | ORAL_TABLET | Freq: Four times a day (QID) | ORAL | Status: DC | PRN

## 2013-04-02 NOTE — Telephone Encounter (Signed)
Mom called for patient and stated that her daughter is :throwing up all the time can she get something.  Per Dr. Reola CalkinsBeck pt can have phenergan 25 mg po q 6 hours.  Pt's mother stated understanding.

## 2013-04-09 ENCOUNTER — Encounter: Payer: Medicaid Other | Admitting: Advanced Practice Midwife

## 2013-04-23 ENCOUNTER — Ambulatory Visit (INDEPENDENT_AMBULATORY_CARE_PROVIDER_SITE_OTHER): Payer: Medicaid Other | Admitting: Advanced Practice Midwife

## 2013-04-23 ENCOUNTER — Encounter: Payer: Self-pay | Admitting: Advanced Practice Midwife

## 2013-04-23 VITALS — BP 100/66 | Temp 97.0°F | Wt 117.2 lb

## 2013-04-23 DIAGNOSIS — IMO0002 Reserved for concepts with insufficient information to code with codable children: Secondary | ICD-10-CM

## 2013-04-23 DIAGNOSIS — F1920 Other psychoactive substance dependence, uncomplicated: Secondary | ICD-10-CM

## 2013-04-23 DIAGNOSIS — F1210 Cannabis abuse, uncomplicated: Secondary | ICD-10-CM

## 2013-04-23 DIAGNOSIS — O99619 Diseases of the digestive system complicating pregnancy, unspecified trimester: Secondary | ICD-10-CM

## 2013-04-23 DIAGNOSIS — K5900 Constipation, unspecified: Secondary | ICD-10-CM

## 2013-04-23 DIAGNOSIS — Z23 Encounter for immunization: Secondary | ICD-10-CM

## 2013-04-23 DIAGNOSIS — O99320 Drug use complicating pregnancy, unspecified trimester: Secondary | ICD-10-CM

## 2013-04-23 DIAGNOSIS — Z3401 Encounter for supervision of normal first pregnancy, first trimester: Secondary | ICD-10-CM

## 2013-04-23 DIAGNOSIS — O9989 Other specified diseases and conditions complicating pregnancy, childbirth and the puerperium: Secondary | ICD-10-CM

## 2013-04-23 LAB — POCT URINALYSIS DIP (DEVICE)
Bilirubin Urine: NEGATIVE
Glucose, UA: NEGATIVE mg/dL
Hgb urine dipstick: NEGATIVE
Ketones, ur: NEGATIVE mg/dL
Nitrite: NEGATIVE
PH: 6.5 (ref 5.0–8.0)
PROTEIN: NEGATIVE mg/dL
SPECIFIC GRAVITY, URINE: 1.025 (ref 1.005–1.030)
Urobilinogen, UA: 1 mg/dL (ref 0.0–1.0)

## 2013-04-23 MED ORDER — TETANUS-DIPHTH-ACELL PERTUSSIS 5-2.5-18.5 LF-MCG/0.5 IM SUSP: 0.5000 mL | Freq: Once | INTRAMUSCULAR | Status: DC

## 2013-04-23 MED ORDER — DOCUSATE SODIUM 100 MG PO CAPS: 100.0000 mg | ORAL_CAPSULE | Freq: Every day | ORAL | Status: DC | PRN

## 2013-04-23 NOTE — Patient Instructions (Signed)
Third Trimester of Pregnancy  The third trimester is from week 29 through week 42, months 7 through 9. The third trimester is a time when the fetus is growing rapidly. At the end of the ninth month, the fetus is about 20 inches in length and weighs 6 10 pounds.   BODY CHANGES  Your body goes through many changes during pregnancy. The changes vary from woman to woman.    Your weight will continue to increase. You can expect to gain 25 35 pounds (11 16 kg) by the end of the pregnancy.   You may begin to get stretch marks on your hips, abdomen, and breasts.   You may urinate more often because the fetus is moving lower into your pelvis and pressing on your bladder.   You may develop or continue to have heartburn as a result of your pregnancy.   You may develop constipation because certain hormones are causing the muscles that push waste through your intestines to slow down.   You may develop hemorrhoids or swollen, bulging veins (varicose veins).   You may have pelvic pain because of the weight gain and pregnancy hormones relaxing your joints between the bones in your pelvis. Back aches may result from over exertion of the muscles supporting your posture.   Your breasts will continue to grow and be tender. A yellow discharge may leak from your breasts called colostrum.   Your belly button may stick out.   You may feel short of breath because of your expanding uterus.   You may notice the fetus "dropping," or moving lower in your abdomen.   You may have a bloody mucus discharge. This usually occurs a few days to a week before labor begins.   Your cervix becomes thin and soft (effaced) near your due date.  WHAT TO EXPECT AT YOUR PRENATAL EXAMS   You will have prenatal exams every 2 weeks until week 36. Then, you will have weekly prenatal exams. During a routine prenatal visit:   You will be weighed to make sure you and the fetus are growing normally.   Your blood pressure is taken.   Your abdomen will be  measured to track your baby's growth.   The fetal heartbeat will be listened to.   Any test results from the previous visit will be discussed.   You may have a cervical check near your due date to see if you have effaced.  At around 36 weeks, your caregiver will check your cervix. At the same time, your caregiver will also perform a test on the secretions of the vaginal tissue. This test is to determine if a type of bacteria, Group B streptococcus, is present. Your caregiver will explain this further.  Your caregiver may ask you:   What your birth plan is.   How you are feeling.   If you are feeling the baby move.   If you have had any abnormal symptoms, such as leaking fluid, bleeding, severe headaches, or abdominal cramping.   If you have any questions.  Other tests or screenings that may be performed during your third trimester include:   Blood tests that check for low iron levels (anemia).   Fetal testing to check the health, activity level, and growth of the fetus. Testing is done if you have certain medical conditions or if there are problems during the pregnancy.  FALSE LABOR  You may feel small, irregular contractions that eventually go away. These are called Braxton Hicks contractions, or   false labor. Contractions may last for hours, days, or even weeks before true labor sets in. If contractions come at regular intervals, intensify, or become painful, it is best to be seen by your caregiver.   SIGNS OF LABOR    Menstrual-like cramps.   Contractions that are 5 minutes apart or less.   Contractions that start on the top of the uterus and spread down to the lower abdomen and back.   A sense of increased pelvic pressure or back pain.   A watery or bloody mucus discharge that comes from the vagina.  If you have any of these signs before the 37th week of pregnancy, call your caregiver right away. You need to go to the hospital to get checked immediately.  HOME CARE INSTRUCTIONS    Avoid all  smoking, herbs, alcohol, and unprescribed drugs. These chemicals affect the formation and growth of the baby.   Follow your caregiver's instructions regarding medicine use. There are medicines that are either safe or unsafe to take during pregnancy.   Exercise only as directed by your caregiver. Experiencing uterine cramps is a good sign to stop exercising.   Continue to eat regular, healthy meals.   Wear a good support bra for breast tenderness.   Do not use hot tubs, steam rooms, or saunas.   Wear your seat belt at all times when driving.   Avoid raw meat, uncooked cheese, cat litter boxes, and soil used by cats. These carry germs that can cause birth defects in the baby.   Take your prenatal vitamins.   Try taking a stool softener (if your caregiver approves) if you develop constipation. Eat more high-fiber foods, such as fresh vegetables or fruit and whole grains. Drink plenty of fluids to keep your urine clear or pale yellow.   Take warm sitz baths to soothe any pain or discomfort caused by hemorrhoids. Use hemorrhoid cream if your caregiver approves.   If you develop varicose veins, wear support hose. Elevate your feet for 15 minutes, 3 4 times a day. Limit salt in your diet.   Avoid heavy lifting, wear low heal shoes, and practice good posture.   Rest a lot with your legs elevated if you have leg cramps or low back pain.   Visit your dentist if you have not gone during your pregnancy. Use a soft toothbrush to brush your teeth and be gentle when you floss.   A sexual relationship may be continued unless your caregiver directs you otherwise.   Do not travel far distances unless it is absolutely necessary and only with the approval of your caregiver.   Take prenatal classes to understand, practice, and ask questions about the labor and delivery.   Make a trial run to the hospital.   Pack your hospital bag.   Prepare the baby's nursery.   Continue to go to all your prenatal visits as directed  by your caregiver.  SEEK MEDICAL CARE IF:   You are unsure if you are in labor or if your water has broken.   You have dizziness.   You have mild pelvic cramps, pelvic pressure, or nagging pain in your abdominal area.   You have persistent nausea, vomiting, or diarrhea.   You have a bad smelling vaginal discharge.   You have pain with urination.  SEEK IMMEDIATE MEDICAL CARE IF:    You have a fever.   You are leaking fluid from your vagina.   You have spotting or bleeding from your vagina.     You have severe abdominal cramping or pain.   You have rapid weight loss or gain.   You have shortness of breath with chest pain.   You notice sudden or extreme swelling of your face, hands, ankles, feet, or legs.   You have not felt your baby move in over an hour.   You have severe headaches that do not go away with medicine.   You have vision changes.  Document Released: 02/01/2001 Document Revised: 10/10/2012 Document Reviewed: 04/10/2012  [employer] Patient Information 2014 [employer].

## 2013-04-23 NOTE — Progress Notes (Signed)
Doing well, except for some constipation (Colace reordered for [facility]) and intermittent dizziness. Has some ear itching on left. Has new FP doctor, advised to call them for ear eval. Some numbness in anterior thigh when lies on that hip. TDAP given. Glucola done.

## 2013-04-23 NOTE — Progress Notes (Signed)
P= 89 C/o of intermittent, mild lower abdominal/pelvic pain and lower back pain.  Tdap today.

## 2013-04-24 ENCOUNTER — Encounter: Payer: Self-pay | Admitting: Advanced Practice Midwife

## 2013-04-24 LAB — GLUCOSE TOLERANCE, 1 HOUR (50G) W/O FASTING: Glucose, 1 Hour GTT: 103 mg/dL (ref 70–140)

## 2013-05-14 ENCOUNTER — Ambulatory Visit (INDEPENDENT_AMBULATORY_CARE_PROVIDER_SITE_OTHER): Payer: Medicaid Other | Admitting: Advanced Practice Midwife

## 2013-05-14 VITALS — BP 91/61 | Temp 97.6°F | Wt 120.7 lb

## 2013-05-14 DIAGNOSIS — Z3401 Encounter for supervision of normal first pregnancy, first trimester: Secondary | ICD-10-CM

## 2013-05-14 DIAGNOSIS — Z3400 Encounter for supervision of normal first pregnancy, unspecified trimester: Secondary | ICD-10-CM

## 2013-05-14 LAB — POCT URINALYSIS DIP (DEVICE)
Bilirubin Urine: NEGATIVE
Glucose, UA: NEGATIVE mg/dL
HGB URINE DIPSTICK: NEGATIVE
Ketones, ur: NEGATIVE mg/dL
Nitrite: NEGATIVE
Protein, ur: NEGATIVE mg/dL
Specific Gravity, Urine: 1.02 (ref 1.005–1.030)
UROBILINOGEN UA: 0.2 mg/dL (ref 0.0–1.0)
pH: 7 (ref 5.0–8.0)

## 2013-05-14 LAB — CBC
HCT: 27.9 % — ABNORMAL LOW (ref 36.0–46.0)
Hemoglobin: 9.6 g/dL — ABNORMAL LOW (ref 12.0–15.0)
MCH: 28.2 pg (ref 26.0–34.0)
MCHC: 34.4 g/dL (ref 30.0–36.0)
MCV: 82.1 fL (ref 78.0–100.0)
PLATELETS: 214 10*3/uL (ref 150–400)
RBC: 3.4 MIL/uL — ABNORMAL LOW (ref 3.87–5.11)
RDW: 14.5 % (ref 11.5–15.5)
WBC: 10 10*3/uL (ref 4.0–10.5)

## 2013-05-14 NOTE — Progress Notes (Signed)
P=102 28 wk labs to be done at this visit, was not completed with glucola

## 2013-05-14 NOTE — Progress Notes (Signed)
Doing well.  Good fetal movement, denies vaginal bleeding, LOF, cramping/contractions. 

## 2013-05-15 LAB — HIV ANTIBODY (ROUTINE TESTING W REFLEX): HIV: NONREACTIVE

## 2013-05-15 LAB — RPR

## 2013-05-28 ENCOUNTER — Encounter: Payer: Self-pay | Admitting: Obstetrics and Gynecology

## 2013-05-28 ENCOUNTER — Ambulatory Visit (INDEPENDENT_AMBULATORY_CARE_PROVIDER_SITE_OTHER): Payer: Medicaid Other | Admitting: Obstetrics and Gynecology

## 2013-05-28 VITALS — BP 101/64 | Temp 98.0°F | Wt 126.4 lb

## 2013-05-28 DIAGNOSIS — O99019 Anemia complicating pregnancy, unspecified trimester: Secondary | ICD-10-CM

## 2013-05-28 DIAGNOSIS — Z3400 Encounter for supervision of normal first pregnancy, unspecified trimester: Secondary | ICD-10-CM

## 2013-05-28 DIAGNOSIS — IMO0001 Reserved for inherently not codable concepts without codable children: Secondary | ICD-10-CM | POA: Insufficient documentation

## 2013-05-28 DIAGNOSIS — Z3403 Encounter for supervision of normal first pregnancy, third trimester: Secondary | ICD-10-CM

## 2013-05-28 DIAGNOSIS — D509 Iron deficiency anemia, unspecified: Secondary | ICD-10-CM

## 2013-05-28 LAB — POCT URINALYSIS DIP (DEVICE)
Bilirubin Urine: NEGATIVE
Glucose, UA: 100 mg/dL — AB
HGB URINE DIPSTICK: NEGATIVE
Ketones, ur: NEGATIVE mg/dL
Leukocytes, UA: NEGATIVE
NITRITE: NEGATIVE
PH: 7 (ref 5.0–8.0)
PROTEIN: NEGATIVE mg/dL
Specific Gravity, Urine: 1.02 (ref 1.005–1.030)
UROBILINOGEN UA: 1 mg/dL (ref 0.0–1.0)

## 2013-05-28 NOTE — Patient Instructions (Signed)
Third Trimester of Pregnancy  The third trimester is from week 29 through week 42, months 7 through 9. The third trimester is a time when the fetus is growing rapidly. At the end of the ninth month, the fetus is about 20 inches in length and weighs 6 10 pounds.   BODY CHANGES  Your body goes through many changes during pregnancy. The changes vary from woman to woman.    Your weight will continue to increase. You can expect to gain 25 35 pounds (11 16 kg) by the end of the pregnancy.   You may begin to get stretch marks on your hips, abdomen, and breasts.   You may urinate more often because the fetus is moving lower into your pelvis and pressing on your bladder.   You may develop or continue to have heartburn as a result of your pregnancy.   You may develop constipation because certain hormones are causing the muscles that push waste through your intestines to slow down.   You may develop hemorrhoids or swollen, bulging veins (varicose veins).   You may have pelvic pain because of the weight gain and pregnancy hormones relaxing your joints between the bones in your pelvis. Back aches may result from over exertion of the muscles supporting your posture.   Your breasts will continue to grow and be tender. A yellow discharge may leak from your breasts called colostrum.   Your belly button may stick out.   You may feel short of breath because of your expanding uterus.   You may notice the fetus "dropping," or moving lower in your abdomen.   You may have a bloody mucus discharge. This usually occurs a few days to a week before labor begins.   Your cervix becomes thin and soft (effaced) near your due date.  WHAT TO EXPECT AT YOUR PRENATAL EXAMS   You will have prenatal exams every 2 weeks until week 36. Then, you will have weekly prenatal exams. During a routine prenatal visit:   You will be weighed to make sure you and the fetus are growing normally.   Your blood pressure is taken.   Your abdomen will be  measured to track your baby's growth.   The fetal heartbeat will be listened to.   Any test results from the previous visit will be discussed.   You may have a cervical check near your due date to see if you have effaced.  At around 36 weeks, your caregiver will check your cervix. At the same time, your caregiver will also perform a test on the secretions of the vaginal tissue. This test is to determine if a type of bacteria, Group B streptococcus, is present. Your caregiver will explain this further.  Your caregiver may ask you:   What your birth plan is.   How you are feeling.   If you are feeling the baby move.   If you have had any abnormal symptoms, such as leaking fluid, bleeding, severe headaches, or abdominal cramping.   If you have any questions.  Other tests or screenings that may be performed during your third trimester include:   Blood tests that check for low iron levels (anemia).   Fetal testing to check the health, activity level, and growth of the fetus. Testing is done if you have certain medical conditions or if there are problems during the pregnancy.  FALSE LABOR  You may feel small, irregular contractions that eventually go away. These are called Braxton Hicks contractions, or   false labor. Contractions may last for hours, days, or even weeks before true labor sets in. If contractions come at regular intervals, intensify, or become painful, it is best to be seen by your caregiver.   SIGNS OF LABOR    Menstrual-like cramps.   Contractions that are 5 minutes apart or less.   Contractions that start on the top of the uterus and spread down to the lower abdomen and back.   A sense of increased pelvic pressure or back pain.   A watery or bloody mucus discharge that comes from the vagina.  If you have any of these signs before the 37th week of pregnancy, call your caregiver right away. You need to go to the hospital to get checked immediately.  HOME CARE INSTRUCTIONS    Avoid all  smoking, herbs, alcohol, and unprescribed drugs. These chemicals affect the formation and growth of the baby.   Follow your caregiver's instructions regarding medicine use. There are medicines that are either safe or unsafe to take during pregnancy.   Exercise only as directed by your caregiver. Experiencing uterine cramps is a good sign to stop exercising.   Continue to eat regular, healthy meals.   Wear a good support bra for breast tenderness.   Do not use hot tubs, steam rooms, or saunas.   Wear your seat belt at all times when driving.   Avoid raw meat, uncooked cheese, cat litter boxes, and soil used by cats. These carry germs that can cause birth defects in the baby.   Take your prenatal vitamins.   Try taking a stool softener (if your caregiver approves) if you develop constipation. Eat more high-fiber foods, such as fresh vegetables or fruit and whole grains. Drink plenty of fluids to keep your urine clear or pale yellow.   Take warm sitz baths to soothe any pain or discomfort caused by hemorrhoids. Use hemorrhoid cream if your caregiver approves.   If you develop varicose veins, wear support hose. Elevate your feet for 15 minutes, 3 4 times a day. Limit salt in your diet.   Avoid heavy lifting, wear low heal shoes, and practice good posture.   Rest a lot with your legs elevated if you have leg cramps or low back pain.   Visit your dentist if you have not gone during your pregnancy. Use a soft toothbrush to brush your teeth and be gentle when you floss.   A sexual relationship may be continued unless your caregiver directs you otherwise.   Do not travel far distances unless it is absolutely necessary and only with the approval of your caregiver.   Take prenatal classes to understand, practice, and ask questions about the labor and delivery.   Make a trial run to the hospital.   Pack your hospital bag.   Prepare the baby's nursery.   Continue to go to all your prenatal visits as directed  by your caregiver.  SEEK MEDICAL CARE IF:   You are unsure if you are in labor or if your water has broken.   You have dizziness.   You have mild pelvic cramps, pelvic pressure, or nagging pain in your abdominal area.   You have persistent nausea, vomiting, or diarrhea.   You have a bad smelling vaginal discharge.   You have pain with urination.  SEEK IMMEDIATE MEDICAL CARE IF:    You have a fever.   You are leaking fluid from your vagina.   You have spotting or bleeding from your vagina.     You have severe abdominal cramping or pain.   You have rapid weight loss or gain.   You have shortness of breath with chest pain.   You notice sudden or extreme swelling of your face, hands, ankles, feet, or legs.   You have not felt your baby move in over an hour.   You have severe headaches that do not go away with medicine.   You have vision changes.  Document Released: 02/01/2001 Document Revised: 10/10/2012 Document Reviewed: 04/10/2012  [employer] Patient Information 2014 [employer].

## 2013-05-28 NOTE — Progress Notes (Signed)
P=92, States stopped taking colace and phenergan because one of them made her feel "wierd_ like I wanted to grab something and hold it tight". States still having some constipation.

## 2013-05-28 NOTE — Progress Notes (Signed)
Anemia with hgb 9.6> add iron supplement. F/U UDS sent. Watch FH.

## 2013-05-29 LAB — PRESCRIPTION MONITORING PROFILE (19 PANEL)
Amphetamine/Meth: NEGATIVE ng/mL
BARBITURATE SCREEN, URINE: NEGATIVE ng/mL
BENZODIAZEPINE SCREEN, URINE: NEGATIVE ng/mL
BUPRENORPHINE, URINE: NEGATIVE ng/mL
COCAINE METABOLITES: NEGATIVE ng/mL
Cannabinoid Scrn, Ur: NEGATIVE ng/mL
Carisoprodol, Urine: NEGATIVE ng/mL
Creatinine, Urine: 135.18 mg/dL (ref >20.0)
Fentanyl, Ur: NEGATIVE ng/mL
MDMA URINE: NEGATIVE ng/mL
MEPERIDINE UR: NEGATIVE ng/mL
METHAQUALONE SCREEN (URINE): NEGATIVE ng/mL
Methadone Screen, Urine: NEGATIVE ng/mL
Nitrites, Initial: NEGATIVE ug/mL
OPIATE SCREEN, URINE: NEGATIVE ng/mL
Oxycodone Screen, Ur: NEGATIVE ng/mL
Phencyclidine, Ur: NEGATIVE ng/mL
Propoxyphene: NEGATIVE ng/mL
Tapentadol, urine: NEGATIVE ng/mL
Tramadol Scrn, Ur: NEGATIVE ng/mL
ZOLPIDEM, URINE: NEGATIVE ng/mL
pH, Initial: 7.2 pH (ref 4.5–8.9)

## 2013-06-11 ENCOUNTER — Ambulatory Visit (INDEPENDENT_AMBULATORY_CARE_PROVIDER_SITE_OTHER): Payer: Medicaid Other | Admitting: Advanced Practice Midwife

## 2013-06-11 VITALS — BP 102/65 | HR 102 | Temp 98.3°F | Wt 124.8 lb

## 2013-06-11 DIAGNOSIS — F1920 Other psychoactive substance dependence, uncomplicated: Secondary | ICD-10-CM

## 2013-06-11 DIAGNOSIS — F1210 Cannabis abuse, uncomplicated: Secondary | ICD-10-CM

## 2013-06-11 DIAGNOSIS — O99320 Drug use complicating pregnancy, unspecified trimester: Secondary | ICD-10-CM

## 2013-06-11 DIAGNOSIS — Z3401 Encounter for supervision of normal first pregnancy, first trimester: Secondary | ICD-10-CM

## 2013-06-11 LAB — POCT URINALYSIS DIP (DEVICE)
Glucose, UA: NEGATIVE mg/dL
HGB URINE DIPSTICK: NEGATIVE
Ketones, ur: 15 mg/dL — AB
NITRITE: NEGATIVE
PH: 7 (ref 5.0–8.0)
Protein, ur: 30 mg/dL — AB
SPECIFIC GRAVITY, URINE: 1.02 (ref 1.005–1.030)
Urobilinogen, UA: 1 mg/dL (ref 0.0–1.0)

## 2013-06-11 NOTE — Progress Notes (Signed)
C/o of intermittent lower abdominal/pelvic pressure.

## 2013-06-11 NOTE — Progress Notes (Signed)
Doing well.  Good fetal movement, denies vaginal bleeding, LOF, regular contractions.  Discussed LARCs and other birth control options postpartum.  Pt interested in Mirena vs Depo.

## 2013-06-26 ENCOUNTER — Ambulatory Visit (INDEPENDENT_AMBULATORY_CARE_PROVIDER_SITE_OTHER): Payer: Medicaid Other | Admitting: Obstetrics and Gynecology

## 2013-06-26 ENCOUNTER — Encounter: Payer: Self-pay | Admitting: Obstetrics and Gynecology

## 2013-06-26 VITALS — BP 101/65 | HR 100 | Temp 97.7°F | Wt 121.8 lb

## 2013-06-26 DIAGNOSIS — Z3400 Encounter for supervision of normal first pregnancy, unspecified trimester: Secondary | ICD-10-CM

## 2013-06-26 DIAGNOSIS — O99019 Anemia complicating pregnancy, unspecified trimester: Secondary | ICD-10-CM

## 2013-06-26 DIAGNOSIS — IMO0001 Reserved for inherently not codable concepts without codable children: Secondary | ICD-10-CM

## 2013-06-26 DIAGNOSIS — Z3401 Encounter for supervision of normal first pregnancy, first trimester: Secondary | ICD-10-CM

## 2013-06-26 DIAGNOSIS — F1210 Cannabis abuse, uncomplicated: Secondary | ICD-10-CM

## 2013-06-26 DIAGNOSIS — D509 Iron deficiency anemia, unspecified: Secondary | ICD-10-CM

## 2013-06-26 LAB — POCT URINALYSIS DIP (DEVICE)
BILIRUBIN URINE: NEGATIVE
Glucose, UA: NEGATIVE mg/dL
Hgb urine dipstick: NEGATIVE
KETONES UR: NEGATIVE mg/dL
Nitrite: NEGATIVE
PROTEIN: NEGATIVE mg/dL
SPECIFIC GRAVITY, URINE: 1.02 (ref 1.005–1.030)
Urobilinogen, UA: 1 mg/dL (ref 0.0–1.0)
pH: 7.5 (ref 5.0–8.0)

## 2013-06-26 LAB — OB RESULTS CONSOLE GBS: GBS: NEGATIVE

## 2013-06-26 NOTE — Patient Instructions (Signed)
Third Trimester of Pregnancy  The third trimester is from week 29 through week 42, months 7 through 9. The third trimester is a time when the fetus is growing rapidly. At the end of the ninth month, the fetus is about 20 inches in length and weighs 6 10 pounds.   BODY CHANGES  Your body goes through many changes during pregnancy. The changes vary from woman to woman.    Your weight will continue to increase. You can expect to gain 25 35 pounds (11 16 kg) by the end of the pregnancy.   You may begin to get stretch marks on your hips, abdomen, and breasts.   You may urinate more often because the fetus is moving lower into your pelvis and pressing on your bladder.   You may develop or continue to have heartburn as a result of your pregnancy.   You may develop constipation because certain hormones are causing the muscles that push waste through your intestines to slow down.   You may develop hemorrhoids or swollen, bulging veins (varicose veins).   You may have pelvic pain because of the weight gain and pregnancy hormones relaxing your joints between the bones in your pelvis. Back aches may result from over exertion of the muscles supporting your posture.   Your breasts will continue to grow and be tender. A yellow discharge may leak from your breasts called colostrum.   Your belly button may stick out.   You may feel short of breath because of your expanding uterus.   You may notice the fetus "dropping," or moving lower in your abdomen.   You may have a bloody mucus discharge. This usually occurs a few days to a week before labor begins.   Your cervix becomes thin and soft (effaced) near your due date.  WHAT TO EXPECT AT YOUR PRENATAL EXAMS   You will have prenatal exams every 2 weeks until week 36. Then, you will have weekly prenatal exams. During a routine prenatal visit:   You will be weighed to make sure you and the fetus are growing normally.   Your blood pressure is taken.   Your abdomen will be  measured to track your baby's growth.   The fetal heartbeat will be listened to.   Any test results from the previous visit will be discussed.   You may have a cervical check near your due date to see if you have effaced.  At around 36 weeks, your caregiver will check your cervix. At the same time, your caregiver will also perform a test on the secretions of the vaginal tissue. This test is to determine if a type of bacteria, Group B streptococcus, is present. Your caregiver will explain this further.  Your caregiver may ask you:   What your birth plan is.   How you are feeling.   If you are feeling the baby move.   If you have had any abnormal symptoms, such as leaking fluid, bleeding, severe headaches, or abdominal cramping.   If you have any questions.  Other tests or screenings that may be performed during your third trimester include:   Blood tests that check for low iron levels (anemia).   Fetal testing to check the health, activity level, and growth of the fetus. Testing is done if you have certain medical conditions or if there are problems during the pregnancy.  FALSE LABOR  You may feel small, irregular contractions that eventually go away. These are called Braxton Hicks contractions, or   false labor. Contractions may last for hours, days, or even weeks before true labor sets in. If contractions come at regular intervals, intensify, or become painful, it is best to be seen by your caregiver.   SIGNS OF LABOR    Menstrual-like cramps.   Contractions that are 5 minutes apart or less.   Contractions that start on the top of the uterus and spread down to the lower abdomen and back.   A sense of increased pelvic pressure or back pain.   A watery or bloody mucus discharge that comes from the vagina.  If you have any of these signs before the 37th week of pregnancy, call your caregiver right away. You need to go to the hospital to get checked immediately.  HOME CARE INSTRUCTIONS    Avoid all  smoking, herbs, alcohol, and unprescribed drugs. These chemicals affect the formation and growth of the baby.   Follow your caregiver's instructions regarding medicine use. There are medicines that are either safe or unsafe to take during pregnancy.   Exercise only as directed by your caregiver. Experiencing uterine cramps is a good sign to stop exercising.   Continue to eat regular, healthy meals.   Wear a good support bra for breast tenderness.   Do not use hot tubs, steam rooms, or saunas.   Wear your seat belt at all times when driving.   Avoid raw meat, uncooked cheese, cat litter boxes, and soil used by cats. These carry germs that can cause birth defects in the baby.   Take your prenatal vitamins.   Try taking a stool softener (if your caregiver approves) if you develop constipation. Eat more high-fiber foods, such as fresh vegetables or fruit and whole grains. Drink plenty of fluids to keep your urine clear or pale yellow.   Take warm sitz baths to soothe any pain or discomfort caused by hemorrhoids. Use hemorrhoid cream if your caregiver approves.   If you develop varicose veins, wear support hose. Elevate your feet for 15 minutes, 3 4 times a day. Limit salt in your diet.   Avoid heavy lifting, wear low heal shoes, and practice good posture.   Rest a lot with your legs elevated if you have leg cramps or low back pain.   Visit your dentist if you have not gone during your pregnancy. Use a soft toothbrush to brush your teeth and be gentle when you floss.   A sexual relationship may be continued unless your caregiver directs you otherwise.   Do not travel far distances unless it is absolutely necessary and only with the approval of your caregiver.   Take prenatal classes to understand, practice, and ask questions about the labor and delivery.   Make a trial run to the hospital.   Pack your hospital bag.   Prepare the baby's nursery.   Continue to go to all your prenatal visits as directed  by your caregiver.  SEEK MEDICAL CARE IF:   You are unsure if you are in labor or if your water has broken.   You have dizziness.   You have mild pelvic cramps, pelvic pressure, or nagging pain in your abdominal area.   You have persistent nausea, vomiting, or diarrhea.   You have a bad smelling vaginal discharge.   You have pain with urination.  SEEK IMMEDIATE MEDICAL CARE IF:    You have a fever.   You are leaking fluid from your vagina.   You have spotting or bleeding from your vagina.     You have severe abdominal cramping or pain.   You have rapid weight loss or gain.   You have shortness of breath with chest pain.   You notice sudden or extreme swelling of your face, hands, ankles, feet, or legs.   You have not felt your baby move in over an hour.   You have severe headaches that do not go away with medicine.   You have vision changes.  Document Released: 02/01/2001 Document Revised: 10/10/2012 Document Reviewed: 04/10/2012  [employer] Patient Information 2014 [employer].

## 2013-06-26 NOTE — Progress Notes (Signed)
Intermittent pelvis pressure and contractions.  GBS and cultures today.

## 2013-06-26 NOTE — Progress Notes (Signed)
Doing well. Fetus active. Plans reviewed. Cultures done. Discussed plans: breast, undecided still about contraception. UDS sent. If positive will get growth scan.

## 2013-06-27 LAB — GC/CHLAMYDIA PROBE AMP
CT Probe RNA: NEGATIVE
GC PROBE AMP APTIMA: NEGATIVE

## 2013-06-28 LAB — CULTURE, STREPTOCOCCUS GRP B W/SUSCEPT

## 2013-07-10 ENCOUNTER — Ambulatory Visit (INDEPENDENT_AMBULATORY_CARE_PROVIDER_SITE_OTHER): Payer: Medicaid Other | Admitting: Advanced Practice Midwife

## 2013-07-10 VITALS — BP 95/69 | HR 90 | Temp 97.4°F | Wt 124.3 lb

## 2013-07-10 DIAGNOSIS — F1210 Cannabis abuse, uncomplicated: Secondary | ICD-10-CM

## 2013-07-10 DIAGNOSIS — Z3401 Encounter for supervision of normal first pregnancy, first trimester: Secondary | ICD-10-CM

## 2013-07-10 DIAGNOSIS — Z3400 Encounter for supervision of normal first pregnancy, unspecified trimester: Secondary | ICD-10-CM

## 2013-07-10 DIAGNOSIS — IMO0001 Reserved for inherently not codable concepts without codable children: Secondary | ICD-10-CM

## 2013-07-10 DIAGNOSIS — D509 Iron deficiency anemia, unspecified: Secondary | ICD-10-CM

## 2013-07-10 DIAGNOSIS — O99019 Anemia complicating pregnancy, unspecified trimester: Secondary | ICD-10-CM

## 2013-07-10 LAB — POCT URINALYSIS DIP (DEVICE)
Bilirubin Urine: NEGATIVE
GLUCOSE, UA: NEGATIVE mg/dL
HGB URINE DIPSTICK: NEGATIVE
Ketones, ur: NEGATIVE mg/dL
NITRITE: NEGATIVE
Protein, ur: NEGATIVE mg/dL
Specific Gravity, Urine: 1.015 (ref 1.005–1.030)
UROBILINOGEN UA: 0.2 mg/dL (ref 0.0–1.0)
pH: 7 (ref 5.0–8.0)

## 2013-07-10 NOTE — Patient Instructions (Signed)

## 2013-07-10 NOTE — Progress Notes (Signed)
EFW 6-6.5lbs  Reviewed signs of labor.

## 2013-07-10 NOTE — Progress Notes (Signed)
C/o of intermittent pelvic pressure/pain and irregular contractions.

## 2013-07-17 ENCOUNTER — Ambulatory Visit (INDEPENDENT_AMBULATORY_CARE_PROVIDER_SITE_OTHER): Payer: Medicaid Other | Admitting: Advanced Practice Midwife

## 2013-07-17 VITALS — BP 101/71 | HR 94 | Temp 97.3°F | Wt 122.0 lb

## 2013-07-17 DIAGNOSIS — IMO0001 Reserved for inherently not codable concepts without codable children: Secondary | ICD-10-CM

## 2013-07-17 DIAGNOSIS — O99019 Anemia complicating pregnancy, unspecified trimester: Secondary | ICD-10-CM

## 2013-07-17 DIAGNOSIS — Z3400 Encounter for supervision of normal first pregnancy, unspecified trimester: Secondary | ICD-10-CM

## 2013-07-17 DIAGNOSIS — Z3401 Encounter for supervision of normal first pregnancy, first trimester: Secondary | ICD-10-CM

## 2013-07-17 DIAGNOSIS — D509 Iron deficiency anemia, unspecified: Secondary | ICD-10-CM

## 2013-07-17 LAB — POCT URINALYSIS DIP (DEVICE)
Bilirubin Urine: NEGATIVE
Glucose, UA: NEGATIVE mg/dL
Hgb urine dipstick: NEGATIVE
Ketones, ur: NEGATIVE mg/dL
NITRITE: NEGATIVE
PH: 7.5 (ref 5.0–8.0)
Protein, ur: NEGATIVE mg/dL
Specific Gravity, Urine: 1.015 (ref 1.005–1.030)
UROBILINOGEN UA: 1 mg/dL (ref 0.0–1.0)

## 2013-07-17 NOTE — Progress Notes (Signed)
Doing well.  Good fetal movement, denies vaginal bleeding, LOF, regular contractions. Reported contractions last night that have resolved.

## 2013-07-17 NOTE — Progress Notes (Signed)
Reports having regular contractions last night for hour and then stopped; occurred a few hours later. Reports on and off today.

## 2013-07-18 ENCOUNTER — Telehealth: Payer: Self-pay | Admitting: General Practice

## 2013-07-18 ENCOUNTER — Telehealth (HOSPITAL_COMMUNITY): Payer: Self-pay | Admitting: *Deleted

## 2013-07-18 NOTE — Telephone Encounter (Signed)
[facility] called and stated they now have an induction available on 6/1 like originally requested yesterday. Called patient to inform her of sooner induction date, no answer- left message that we are trying to reach you in regards to an upcoming appt, please call us back as soon as you can. Will send mychart message as well

## 2013-07-18 NOTE — Telephone Encounter (Signed)
Preadmission screen  

## 2013-07-18 NOTE — Telephone Encounter (Signed)
Spoke with patient about mucous membrane coming out, informed patient of signs of labor and when to come to the hospital.  Pt has information for induction and is aware.  Pt has no further questions.

## 2013-07-20 ENCOUNTER — Encounter (HOSPITAL_COMMUNITY): Payer: Self-pay | Admitting: *Deleted

## 2013-07-20 ENCOUNTER — Encounter (HOSPITAL_COMMUNITY): Payer: Medicaid Other | Admitting: Anesthesiology

## 2013-07-20 ENCOUNTER — Inpatient Hospital Stay (HOSPITAL_COMMUNITY): Payer: Medicaid Other | Admitting: Anesthesiology

## 2013-07-20 ENCOUNTER — Inpatient Hospital Stay (HOSPITAL_COMMUNITY)
Admission: AD | Admit: 2013-07-20 | Discharge: 2013-07-22 | DRG: 775 | Disposition: A | Discharge status: Home / Self Care | Payer: Medicaid Other | Source: Ambulatory Visit | Attending: Obstetrics and Gynecology | Admitting: Obstetrics and Gynecology

## 2013-07-20 DIAGNOSIS — Z8249 Family history of ischemic heart disease and other diseases of the circulatory system: Secondary | ICD-10-CM

## 2013-07-20 DIAGNOSIS — O212 Late vomiting of pregnancy: Secondary | ICD-10-CM | POA: Diagnosis present

## 2013-07-20 DIAGNOSIS — D649 Anemia, unspecified: Secondary | ICD-10-CM

## 2013-07-20 DIAGNOSIS — O9902 Anemia complicating childbirth: Secondary | ICD-10-CM | POA: Diagnosis present

## 2013-07-20 DIAGNOSIS — F1210 Cannabis abuse, uncomplicated: Secondary | ICD-10-CM

## 2013-07-20 DIAGNOSIS — O99344 Other mental disorders complicating childbirth: Secondary | ICD-10-CM

## 2013-07-20 DIAGNOSIS — O99334 Smoking (tobacco) complicating childbirth: Secondary | ICD-10-CM | POA: Diagnosis present

## 2013-07-20 DIAGNOSIS — Z3490 Encounter for supervision of normal pregnancy, unspecified, unspecified trimester: Secondary | ICD-10-CM

## 2013-07-20 DIAGNOSIS — Z833 Family history of diabetes mellitus: Secondary | ICD-10-CM

## 2013-07-20 DIAGNOSIS — Z823 Family history of stroke: Secondary | ICD-10-CM

## 2013-07-20 LAB — CBC
HCT: 33.2 % — ABNORMAL LOW (ref 36.0–46.0)
Hemoglobin: 11.2 g/dL — ABNORMAL LOW (ref 12.0–15.0)
MCH: 28.1 pg (ref 26.0–34.0)
MCHC: 33.7 g/dL (ref 30.0–36.0)
MCV: 83.4 fL (ref 78.0–100.0)
Platelets: 256 10*3/uL (ref 150–400)
RBC: 3.98 MIL/uL (ref 3.87–5.11)
RDW: 14.9 % (ref 11.5–15.5)
WBC: 17.4 10*3/uL — ABNORMAL HIGH (ref 4.0–10.5)

## 2013-07-20 LAB — RPR

## 2013-07-20 MED ORDER — OXYTOCIN 40 UNITS IN LACTATED RINGERS INFUSION - SIMPLE MED
62.5000 mL/h | INTRAVENOUS | Status: DC
  Administered 2013-07-20: 62.5 mL/h via INTRAVENOUS
  Filled 2013-07-20: qty 1000

## 2013-07-20 MED ORDER — LIDOCAINE HCL (PF) 1 % IJ SOLN
30.0000 mL | INTRAMUSCULAR | Status: DC | PRN
  Filled 2013-07-20: qty 30

## 2013-07-20 MED ORDER — OXYCODONE-ACETAMINOPHEN 5-325 MG PO TABS: 1.0000 | ORAL_TABLET | ORAL | Status: DC | PRN

## 2013-07-20 MED ORDER — NALBUPHINE HCL 10 MG/ML IJ SOLN: 10.0000 mg | INTRAMUSCULAR | Status: DC | PRN

## 2013-07-20 MED ORDER — FENTANYL 2.5 MCG/ML BUPIVACAINE 1/10 % EPIDURAL INFUSION (WH - ANES)
14.0000 mL/h | INTRAMUSCULAR | Status: DC | PRN
  Filled 2013-07-20: qty 125

## 2013-07-20 MED ORDER — SODIUM CHLORIDE 0.9 % IJ SOLN: 3.0000 mL | INTRAMUSCULAR | Status: DC | PRN

## 2013-07-20 MED ORDER — LACTATED RINGERS IV SOLN: INTRAVENOUS | Status: DC

## 2013-07-20 MED ORDER — PRENATAL MULTIVITAMIN CH
1.0000 | ORAL_TABLET | Freq: Every day | ORAL | Status: DC
  Administered 2013-07-21: 1 via ORAL
  Filled 2013-07-20: qty 1

## 2013-07-20 MED ORDER — DIBUCAINE 1 % RE OINT: 1.0000 "application " | TOPICAL_OINTMENT | RECTAL | Status: DC | PRN

## 2013-07-20 MED ORDER — FENTANYL CITRATE 0.05 MG/ML IJ SOLN
100.0000 ug | INTRAMUSCULAR | Status: DC | PRN
  Administered 2013-07-20: 100 ug via INTRAVENOUS
  Filled 2013-07-20: qty 2

## 2013-07-20 MED ORDER — FENTANYL 2.5 MCG/ML BUPIVACAINE 1/10 % EPIDURAL INFUSION (WH - ANES)
INTRAMUSCULAR | Status: DC | PRN
  Administered 2013-07-20: 14 mL/h via EPIDURAL

## 2013-07-20 MED ORDER — OXYTOCIN 40 UNITS IN LACTATED RINGERS INFUSION - SIMPLE MED: 62.5000 mL/h | INTRAVENOUS | Status: DC | PRN

## 2013-07-20 MED ORDER — PROMETHAZINE HCL 25 MG/ML IJ SOLN
25.0000 mg | Freq: Four times a day (QID) | INTRAMUSCULAR | Status: DC | PRN
  Administered 2013-07-20: 25 mg via INTRAVENOUS
  Filled 2013-07-20: qty 1

## 2013-07-20 MED ORDER — PHENYLEPHRINE 40 MCG/ML (10ML) SYRINGE FOR IV PUSH (FOR BLOOD PRESSURE SUPPORT)
80.0000 ug | PREFILLED_SYRINGE | INTRAVENOUS | Status: DC | PRN
  Filled 2013-07-20: qty 2
  Filled 2013-07-20: qty 10

## 2013-07-20 MED ORDER — BENZOCAINE-MENTHOL 20-0.5 % EX AERO: 1.0000 "application " | INHALATION_SPRAY | CUTANEOUS | Status: DC | PRN

## 2013-07-20 MED ORDER — ONDANSETRON HCL 4 MG/2ML IJ SOLN
4.0000 mg | Freq: Four times a day (QID) | INTRAMUSCULAR | Status: DC | PRN
  Administered 2013-07-20: 4 mg via INTRAVENOUS
  Filled 2013-07-20: qty 2

## 2013-07-20 MED ORDER — SODIUM CHLORIDE 0.9 % IV SOLN: 250.0000 mL | INTRAVENOUS | Status: DC | PRN

## 2013-07-20 MED ORDER — TETANUS-DIPHTH-ACELL PERTUSSIS 5-2.5-18.5 LF-MCG/0.5 IM SUSP: 0.5000 mL | Freq: Once | INTRAMUSCULAR | Status: DC

## 2013-07-20 MED ORDER — OXYTOCIN BOLUS FROM INFUSION: 500.0000 mL | INTRAVENOUS | Status: DC

## 2013-07-20 MED ORDER — ZOLPIDEM TARTRATE 5 MG PO TABS: 5.0000 mg | ORAL_TABLET | Freq: Every evening | ORAL | Status: DC | PRN

## 2013-07-20 MED ORDER — DIPHENHYDRAMINE HCL 25 MG PO CAPS: 25.0000 mg | ORAL_CAPSULE | Freq: Four times a day (QID) | ORAL | Status: DC | PRN

## 2013-07-20 MED ORDER — BISACODYL 10 MG RE SUPP: 10.0000 mg | Freq: Every day | RECTAL | Status: DC | PRN

## 2013-07-20 MED ORDER — IBUPROFEN 600 MG PO TABS
600.0000 mg | ORAL_TABLET | Freq: Four times a day (QID) | ORAL | Status: DC | PRN
  Administered 2013-07-20: 600 mg via ORAL
  Filled 2013-07-20: qty 1

## 2013-07-20 MED ORDER — SIMETHICONE 80 MG PO CHEW: 80.0000 mg | CHEWABLE_TABLET | ORAL | Status: DC | PRN

## 2013-07-20 MED ORDER — FENTANYL 2.5 MCG/ML BUPIVACAINE 1/10 % EPIDURAL INFUSION (WH - ANES): 14.0000 mL/h | INTRAMUSCULAR | Status: DC | PRN

## 2013-07-20 MED ORDER — WITCH HAZEL-GLYCERIN EX PADS: 1.0000 "application " | MEDICATED_PAD | CUTANEOUS | Status: DC | PRN

## 2013-07-20 MED ORDER — CITRIC ACID-SODIUM CITRATE 334-500 MG/5ML PO SOLN: 30.0000 mL | ORAL | Status: DC | PRN

## 2013-07-20 MED ORDER — ONDANSETRON HCL 4 MG/2ML IJ SOLN: 4.0000 mg | INTRAMUSCULAR | Status: DC | PRN

## 2013-07-20 MED ORDER — ONDANSETRON HCL 4 MG PO TABS: 4.0000 mg | ORAL_TABLET | ORAL | Status: DC | PRN

## 2013-07-20 MED ORDER — IBUPROFEN 600 MG PO TABS
600.0000 mg | ORAL_TABLET | Freq: Four times a day (QID) | ORAL | Status: DC
  Administered 2013-07-20 – 2013-07-22 (×7): 600 mg via ORAL
  Filled 2013-07-20 (×7): qty 1

## 2013-07-20 MED ORDER — EPHEDRINE 5 MG/ML INJ
10.0000 mg | INTRAVENOUS | Status: DC | PRN
  Filled 2013-07-20: qty 2

## 2013-07-20 MED ORDER — DIPHENHYDRAMINE HCL 50 MG/ML IJ SOLN: 12.5000 mg | INTRAMUSCULAR | Status: DC | PRN

## 2013-07-20 MED ORDER — FLEET ENEMA 7-19 GM/118ML RE ENEM: 1.0000 | ENEMA | Freq: Every day | RECTAL | Status: DC | PRN

## 2013-07-20 MED ORDER — PHENYLEPHRINE 40 MCG/ML (10ML) SYRINGE FOR IV PUSH (FOR BLOOD PRESSURE SUPPORT)
80.0000 ug | PREFILLED_SYRINGE | INTRAVENOUS | Status: DC | PRN
  Filled 2013-07-20: qty 2

## 2013-07-20 MED ORDER — ACETAMINOPHEN 325 MG PO TABS
650.0000 mg | ORAL_TABLET | ORAL | Status: DC | PRN
Start: 2013-07-20 — End: 2013-07-20

## 2013-07-20 MED ORDER — LACTATED RINGERS IV SOLN
500.0000 mL | INTRAVENOUS | Status: DC | PRN
  Administered 2013-07-20: 1000 mL via INTRAVENOUS

## 2013-07-20 MED ORDER — LIDOCAINE HCL (PF) 1 % IJ SOLN
INTRAMUSCULAR | Status: DC | PRN
  Administered 2013-07-20 (×2): 4 mL

## 2013-07-20 MED ORDER — SENNOSIDES-DOCUSATE SODIUM 8.6-50 MG PO TABS
2.0000 | ORAL_TABLET | ORAL | Status: DC
  Administered 2013-07-21 (×2): 2 via ORAL
  Filled 2013-07-20 (×2): qty 2

## 2013-07-20 MED ORDER — NALBUPHINE HCL 10 MG/ML IJ SOLN
10.0000 mg | Freq: Four times a day (QID) | INTRAMUSCULAR | Status: DC | PRN
  Administered 2013-07-20: 10 mg via INTRAVENOUS
  Filled 2013-07-20: qty 1

## 2013-07-20 MED ORDER — LACTATED RINGERS IV SOLN
500.0000 mL | Freq: Once | INTRAVENOUS | Status: AC
  Administered 2013-07-20: 500 mL via INTRAVENOUS

## 2013-07-20 MED ORDER — SODIUM CHLORIDE 0.9 % IJ SOLN: 3.0000 mL | Freq: Two times a day (BID) | INTRAMUSCULAR | Status: DC

## 2013-07-20 MED ORDER — EPHEDRINE 5 MG/ML INJ
10.0000 mg | INTRAVENOUS | Status: DC | PRN
  Filled 2013-07-20: qty 2
  Filled 2013-07-20: qty 4

## 2013-07-20 MED ORDER — FLEET ENEMA 7-19 GM/118ML RE ENEM: 1.0000 | ENEMA | RECTAL | Status: DC | PRN

## 2013-07-20 MED ORDER — OXYCODONE-ACETAMINOPHEN 5-325 MG PO TABS
1.0000 | ORAL_TABLET | ORAL | Status: DC | PRN
  Administered 2013-07-21 (×2): 1 via ORAL
  Filled 2013-07-20 (×2): qty 1

## 2013-07-20 MED ORDER — LANOLIN HYDROUS EX OINT: TOPICAL_OINTMENT | CUTANEOUS | Status: DC | PRN

## 2013-07-20 MED ORDER — MEASLES, MUMPS & RUBELLA VAC ~~LOC~~ INJ
0.5000 mL | INJECTION | Freq: Once | SUBCUTANEOUS | Status: DC
  Filled 2013-07-20: qty 0.5

## 2013-07-20 MED ORDER — PROMETHAZINE HCL 25 MG PO TABS: 25.0000 mg | ORAL_TABLET | Freq: Four times a day (QID) | ORAL | Status: DC | PRN

## 2013-07-20 NOTE — MAU Note (Signed)
Report called to [facility] in [place]. Aware strip reassuring but not reactive.

## 2013-07-20 NOTE — Lactation Note (Signed)
This note was copied from the chart of Lisa Holt. Lactation Consultation Note  Patient Name: Lisa Holt EHMCN'O Date: 07/20/2013 Reason for consult: Initial assessment;Breast/nipple pain Mom reports nipple tenderness on left breast. Both nipples are flat but very compressible. The left nipple is bruised with dimpling in the center. Nipple becomes erect with stimulation/hand expression. Demonstrated to Mom how to use hand pump to bring nipple out to help with latch. Baby sleepy, attempted to latch demonstrating to Mom how to do breast compression for deeper latch. Baby took few suckles then fell asleep. Encouraged to BF with feeding ques, at least every 3 hours. Cluster feeding reviewed. Encouraged to pre-pump to help with latch. Care for sore nipples reviewed, comfort gels given with instructions. Lactation brochure left for review, advised of OP services and support group. Advised to call for assist with latching baby till she is able to obtain more depth with latch. RN aware of plan.   Maternal Data Formula Feeding for Exclusion: No Infant to breast within first hour of birth: Yes Has patient been taught Hand Expression?: Yes Does the patient have breastfeeding experience prior to this delivery?: No  Feeding Feeding Type: Breast Fed Length of feed:  (few sucks)  LATCH Score/Interventions Latch: Repeated attempts needed to sustain latch, nipple held in mouth throughout feeding, stimulation needed to elicit sucking reflex. Intervention(s): Adjust position;Assist with latch;Breast massage;Breast compression  Audible Swallowing: None  Type of Nipple: Flat Intervention(s): Hand pump (dimpling center of nipple)  Comfort (Breast/Nipple): Filling, red/small blisters or bruises, mild/mod discomfort  Problem noted: Mild/Moderate discomfort Interventions (Mild/moderate discomfort): Hand massage;Hand expression;Comfort gels;Pre-pump if needed  Hold (Positioning): Assistance needed  to correctly position infant at breast and maintain latch. Intervention(s): Breastfeeding basics reviewed;Support Pillows;Position options;Skin to skin  LATCH Score: 4  Lactation Tools Discussed/Used Tools: Pump;Comfort gels Breast pump type: Manual WIC Program: Yes   Consult Status Consult Status: Follow-up Date: 07/21/13 Follow-up type: In-patient    Alfred Levins 07/20/2013, 9:33 PM

## 2013-07-20 NOTE — MAU Provider Note (Signed)
I have seen and examined this patient and I agree with the above. Admitted- see H&P. Lisa Holt 8:52 AM 07/20/2013

## 2013-07-20 NOTE — Anesthesia Preprocedure Evaluation (Signed)
Anesthesia Evaluation  Patient identified by MRN, date of birth, ID band Patient awake    Reviewed: Allergy & Precautions, H&P , NPO status , Patient's Chart, lab work & pertinent test results  Airway Mallampati: II TM Distance: >3 FB Neck ROM: Full    Dental   Pulmonary former smoker,          Cardiovascular negative cardio ROS  Rhythm:Regular Rate:Normal     Neuro/Psych negative neurological ROS  negative psych ROS   GI/Hepatic negative GI ROS, Neg liver ROS,   Endo/Other  negative endocrine ROS  Renal/GU negative Renal ROS     Musculoskeletal negative musculoskeletal ROS (+)   Abdominal   Peds  Hematology  (+) anemia ,   Anesthesia Other Findings   Reproductive/Obstetrics negative OB ROS                           Anesthesia Physical Anesthesia Plan  ASA: II  Anesthesia Plan: Epidural   Post-op Pain Management:    Induction:   Airway Management Planned:   Additional Equipment:   Intra-op Plan:   Post-operative Plan:   Informed Consent: I have reviewed the patients History and Physical, chart, labs and discussed the procedure including the risks, benefits and alternatives for the proposed anesthesia with the patient or authorized representative who has indicated his/her understanding and acceptance.     Plan Discussed with:   Anesthesia Plan Comments:         Anesthesia Quick Evaluation

## 2013-07-20 NOTE — Progress Notes (Signed)
To BS via w/c °

## 2013-07-20 NOTE — Progress Notes (Signed)
Pt vomited again

## 2013-07-20 NOTE — MAU Note (Signed)
Contractions since Weds. Stronger tonight. Lost mucous plug

## 2013-07-20 NOTE — Anesthesia Procedure Notes (Signed)
Epidural Patient location during procedure: OB Start time: 07/20/2013 10:50 AM End time: 07/20/2013 11:00 AM  Staffing Anesthesiologist: Lewie Loron R Performed by: anesthesiologist   Preanesthetic Checklist Completed: patient identified, pre-op evaluation, timeout performed, IV checked, risks and benefits discussed and monitors and equipment checked  Epidural Patient position: sitting Prep: site prepped and draped and DuraPrep Patient monitoring: heart rate Approach: midline Location: L2-L3 Injection technique: LOR air and LOR saline  Needle:  Needle type: Tuohy  Needle gauge: 17 G Needle length: 9 cm Needle insertion depth: 4 cm Catheter type: closed end flexible Catheter size: 19 Gauge Catheter at skin depth: 9 cm Test dose: negative  Assessment Sensory level: T8 Events: blood not aspirated, injection not painful, no injection resistance, negative IV test and no paresthesia  Additional Notes Reason for block:procedure for pain

## 2013-07-20 NOTE — H&P (Signed)
Lisa Holt is a 23 y.o. female presenting for contractions.  History Patient is a 23 yo female G1P0 at 40.5 weeks presenting with onset of contractions. States they started around 10 pm yesterday and they were 15 minutes apart. The contractions have become more frequent since that time. She notes no bleeding or loss of fluid. She reports good fetal movement. She notes she started vomiting yesterday. Has vomitied 2-3x. Non-bloody vomitus. Has not been able to take much in PO, with only wanting to eat soups. She denies abdominal pain and diarrhea with this.  OB History   Grav Para Term Preterm Abortions TAB SAB Ect Mult Living   1              History reviewed. No pertinent past medical history. History reviewed. No pertinent past surgical history. Family History: family history includes Diabetes in her father; Hypertension in her father; Stroke in her father. Social History:  reports that she has been smoking Cigarettes.  She has been smoking about 0.50 packs per day. She does not have any smokeless tobacco history on file. She reports that she drinks alcohol. She reports that she uses illicit drugs (Marijuana).   Prenatal Transfer Tool  Maternal Diabetes: No Genetic Screening: Declined Maternal Ultrasounds/Referrals: Normal Fetal Ultrasounds or other Referrals:  None Maternal Substance Abuse:  Yes:  Type: Smoker, Marijuana Significant Maternal Medications:  None Significant Maternal Lab Results:  Lab values include: Group B Strep negative Other Comments:  None  ROS see HPI  Dilation: 2 Effacement (%): 90 Station: -1;0 Exam by:: Quintella Baton RNc Blood pressure 103/69, pulse 95, temperature 98.2 F (36.8 C), resp. rate 20, height 5\' 3"  (1.6 m), weight 56.7 kg (125 lb), last menstrual period 09/21/2012. Exam Physical Exam  Constitutional: She appears well-developed and well-nourished.  Vomiting during part of the exam  HENT:  Head: Normocephalic and atraumatic.  Mouth/Throat:  Oropharynx is clear and moist.  GI: Soft. There is no tenderness.  Gravid   Musculoskeletal: She exhibits no edema.  Neurological: She is alert.  Skin: Skin is warm and dry.    EFM 125-135, mod variability, no decels, no 15x15 accels Ctx q 2-5 mins  Prenatal labs: ABO, Rh: AB/POS/-- (12/03 1056) Antibody: NEG (12/03 1056) Rubella: 0.70 (12/03 1056) RPR: NON REAC (03/24 1559)  HBsAg: NEGATIVE (12/03 1056)  HIV: NON REACTIVE (03/24 1559)  GBS: Negative (05/06 0000)   Assessment/Plan: Patient is a 23 yo female G1P0 at 40.5 weeks presenting with onset of contractions and reassuring, though non-reactive fetal monitoring.  Will admit to L&D for expectant management and fetal monitoring.  Vomiting potentially related to a gastroenteritis. Will treat nausea with zofran.  Does not appear dehydrated on exam, though will start LR at 125/hr.  Fentanyl for pain. Expect NSVD.   Lisa Holt 07/20/2013, 5:06 AM  I have seen and examined this patient and I agree with the above. Lisa Holt 7:21 AM 07/20/2013

## 2013-07-20 NOTE — Progress Notes (Signed)
Dr Birdie Sons notified of pt's admission and status. Aware of sve,ctx pattern, FHR strip reassuring but not reactive. Aware pt very uncomfortable with ctxs and vomiting some. MD will discuss with K. SHaw CNM and call back to MAU

## 2013-07-20 NOTE — MAU Provider Note (Signed)
  History     CSN: [id number]  Arrival date and time: 07/20/13 4765   None     Chief Complaint  Patient presents with  . Contractions   HPI Patient is a 23 yo female G1P0 at 40.5 weeks presenting with onset of contractions. States they started around 10 pm yesterday and they were 15 minutes apart. The contractions have become more frequent since that time. She notes no bleeding or loss of fluid. She reports good fetal movement. She notes she started vomiting yesterday. Has vomitied 2-3x. Non-bloody vomitus. Has not been able to take much in PO, with only wanting to eat soups. She denies abdominal pain and diarrhea with this.  OB History   Grav Para Term Preterm Abortions TAB SAB Ect Mult Living   1               History reviewed. No pertinent past medical history.  History reviewed. No pertinent past surgical history.  Family History  Problem Relation Age of Onset  . Hypertension Father   . Diabetes Father   . Stroke Father     History  Substance Use Topics  . Smoking status: Current Every Day Smoker -- 0.50 packs/day    Types: Cigarettes  . Smokeless tobacco: Not on file  . Alcohol Use: Yes     Comment: occ- stopped with pregnancy    Allergies:  Allergies  Allergen Reactions  . Latex Itching and Swelling    Facility-administered medications prior to admission  Medication Dose Route Frequency Provider Last Rate Last Dose  . Tdap (BOOSTRIX) injection 0.5 mL  0.5 mL Intramuscular Once Aviva Signs, CNM       Prescriptions prior to admission  Medication Sig Dispense Refill  . Prenatal Vit-Fe Fumarate-FA (PRENATAL COMPLETE) 14-0.4 MG TABS Take 1 tablet by mouth daily.  60 each  4  . promethazine (PHENERGAN) 25 MG tablet Take 1 tablet (25 mg total) by mouth every 6 (six) hours as needed for nausea or vomiting.  30 tablet  1  . docusate sodium (COLACE) 100 MG capsule Take 1 capsule (100 mg total) by mouth daily as needed.  30 capsule  2    ROS see HPI Physical  Exam   Blood pressure 103/69, pulse 95, temperature 98.2 F (36.8 C), resp. rate 20, height 5\' 3"  (1.6 m), weight 56.7 kg (125 lb), last menstrual period 09/21/2012.  Physical Exam  Constitutional: She appears well-developed and well-nourished.  Vomiting during part of the exam  HENT:  Head: Normocephalic and atraumatic.  Mouth/Throat: Oropharynx is clear and moist.  GI: Soft. There is no tenderness.  Gravid   Musculoskeletal: She exhibits no edema.  Neurological: She is alert.  Skin: Skin is warm and dry.   Fetal monitoring: FHR 135, moderate variability, no accels, no decels Contractions every 2 minutes  MAU Course  Procedures  MDM Patient presents with onset of contractions, though no cervical change on recheck. Fetal tracing is reassuring though not reactive and given this in combination with her dating of 54.5 we will admit to L&D for anticipated NSVD.  Assessment and Plan  Patient is a 23 yo female G1P0 at 40.5 weeks presenting with onset of contractions and reassuring, though non-reactive fetal monitoring. Will admit to L&D for expectant management and fetal monitoring. Vomiting potentially related to a gastroenteritis. Will treat nausea with zofran. Does not appear dehydrated on exam, though will start LR at 125/hr. Expect NSVD.   Glori Luis 07/20/2013, 4:55 AM

## 2013-07-20 NOTE — Progress Notes (Signed)
Delivery of live viable female by K. Grace Bushy, CNM. APGARS 8,9

## 2013-07-20 NOTE — Progress Notes (Signed)
Patient ID: Lisa Holt, female   DOB: [birth year], 23 y.o.   MRN: [id number] Lisa Holt is a 23 y.o. G1P0 at [redacted]w[redacted]d admitted for early labor w/ non-reactive NST  Subjective: Getting uncomfortable w/ uc's  Objective: BP 105/60  Pulse 92  Temp(Src) 98.6 F (37 C) (Oral)  Resp 16  Ht 5\' 3"  (1.6 m)  Wt 56.7 kg (125 lb)  BMI 22.15 kg/m2  LMP 09/21/2012    FHT:  FHR: 125 bpm, variability: moderate,  accelerations:  Present,  decelerations:  Present occ variable UC:   regular, every 2-4 minutes  SVE:   Dilation: 4 Effacement (%): 100 Station: -1 Exam by:: Tressia Danas  @ [phone] SROM clear fluid @ 0835  Labs: Lab Results  Component Value Date   WBC 17.4* 07/20/2013   HGB 11.2* 07/20/2013   HCT 33.2* 07/20/2013   MCV 83.4 07/20/2013   PLT 256 07/20/2013    Assessment / Plan: Spontaneous labor, progressing normally  Labor: Progressing normally Fetal Wellbeing:  Category II Pain Control:  nubain Pre-eclampsia: n/a I/D:  n/a Anticipated MOD:  NSVD  Cheron Every Booker CNM, WHNP-BC 07/20/2013, 10:09 AM

## 2013-07-20 NOTE — Progress Notes (Signed)
Pt vomited earlier for 3rd time. Dr Birdie Sons in to see pt and discuss admission.

## 2013-07-20 NOTE — Progress Notes (Signed)
Dr Birdie Sons notified of pt's admission and status. Aware of ctx pattern, sve, reassuring tracing with occ variable. Will reck in an hour

## 2013-07-20 NOTE — MAU Note (Signed)
Pt vomited large amt fld

## 2013-07-21 LAB — HEMOGLOBIN AND HEMATOCRIT, BLOOD
HEMATOCRIT: 28.6 % — AB (ref 36.0–46.0)
HEMOGLOBIN: 9.4 g/dL — AB (ref 12.0–15.0)

## 2013-07-21 MED ORDER — PNEUMOCOCCAL VAC POLYVALENT 25 MCG/0.5ML IJ INJ
0.5000 mL | INJECTION | Freq: Once | INTRAMUSCULAR | Status: AC
  Administered 2013-07-21: 0.5 mL via INTRAMUSCULAR
  Filled 2013-07-21: qty 0.5

## 2013-07-21 MED ORDER — PNEUMOCOCCAL VAC POLYVALENT 25 MCG/0.5ML IJ INJ
0.5000 mL | INJECTION | INTRAMUSCULAR | Status: DC
  Filled 2013-07-21: qty 0.5

## 2013-07-21 NOTE — Progress Notes (Signed)
Post Partum Day 1 Subjective: no complaints, up ad lib, voiding, tolerating PO and + flatus  Objective: Blood pressure 74/42, pulse 76, temperature 98.7 F (37.1 C), temperature source Oral, resp. rate 16, height 5\' 3"  (1.6 m), weight 56.7 kg (125 lb), last menstrual period 09/21/2012, SpO2 97.00%, unknown if currently breastfeeding.  Physical Exam:  General: alert, cooperative, appears stated age and no distress Lochia: appropriate Uterine Fundus: firm DVT Evaluation: No evidence of DVT seen on physical exam. Negative Homan's sign. No cords or calf tenderness. No significant calf/ankle edema.   Recent Labs  07/20/13 0505  HGB 11.2*  HCT 33.2*    Assessment/Plan: Plan for discharge tomorrow, Flat affect, discussed with nursing. Likely tired, but is meeting with SW for MJ usage during pregnancy. Breast feeding, MOC - nexplanon   LOS: 1 day   Minta Balsam 07/21/2013, 9:10 AM

## 2013-07-21 NOTE — Anesthesia Postprocedure Evaluation (Signed)
  Anesthesia Post-op Note  Patient: Lisa Holt  Procedure(s) Performed: * No procedures listed *  Patient Location: PACU and Mother/Baby  Anesthesia Type:Epidural  Level of Consciousness: awake, alert  and oriented  Airway and Oxygen Therapy: Patient Spontanous Breathing  Post-op Pain: mild  Post-op Assessment: Post-op Vital signs reviewed, Patient's Cardiovascular Status Stable, Respiratory Function Stable, Patent Airway, No signs of Nausea or vomiting, Adequate PO intake, Pain level controlled, Pain level not controlled, No headache, No backache, No residual numbness and No residual motor weakness  Post-op Vital Signs: Reviewed and stable  Last Vitals:  Filed Vitals:   07/21/13 0613  BP: 74/42  Pulse: 76  Temp: 37.1 C  Resp: 16    Complications: No apparent anesthesia complications

## 2013-07-22 ENCOUNTER — Inpatient Hospital Stay (HOSPITAL_COMMUNITY): Admission: RE | Admit: 2013-07-22 | Payer: Medicaid Other | Source: Ambulatory Visit

## 2013-07-22 ENCOUNTER — Encounter (HOSPITAL_COMMUNITY): Payer: Medicaid Other

## 2013-07-22 MED ORDER — IBUPROFEN 600 MG PO TABS: 600.0000 mg | ORAL_TABLET | Freq: Four times a day (QID) | ORAL | Status: DC

## 2013-07-22 NOTE — Discharge Instructions (Signed)

## 2013-07-22 NOTE — Progress Notes (Signed)
Ur chart review completed.  

## 2013-07-22 NOTE — Discharge Summary (Signed)
Obstetric Discharge Summary Reason for Admission: onset of labor Prenatal Procedures: ultrasound Intrapartum Procedures: spontaneous vaginal delivery Postpartum Procedures: none Complications-Operative and Postpartum: perieurethral laceration Hemoglobin  Date Value Ref Range Status  07/21/2013 9.4* 12.0 - 15.0 g/dL Final     HCT  Date Value Ref Range Status  07/21/2013 28.6* 36.0 - 46.0 % Final    Physical Exam:  Filed Vitals:   07/22/13 0605  BP: 83/41  Pulse: 67  Temp: 98 F (36.7 C)  Resp: 18   General: alert, cooperative and appears stated age Lochia: appropriate Uterine Fundus: firm Incision: n/a DVT Evaluation: No evidence of DVT seen on physical exam. Negative Homan's sign.  Follow-up Information   Follow up with [facility] In 6 weeks.   Contact information:   [address] [place] [address] [phone]      Newborn Data: Live born female  Birth Weight: 6 lb 6 oz (2892 g) APGAR: 8, 9  Home with mother.  Lisa Holt 07/22/2013, 7:39 AM

## 2013-07-22 NOTE — Discharge Summary (Signed)
`````  Attestation of Attending Supervision of Advanced Practitioner: Evaluation and management procedures were performed by the PA/NP/CNM/OB Fellow under my supervision/collaboration. Chart reviewed and agree with management and plan.  Christin Bach V 07/22/2013 12:57 PM

## 2013-07-24 ENCOUNTER — Inpatient Hospital Stay (HOSPITAL_COMMUNITY): Payer: Medicaid Other

## 2013-07-24 NOTE — MAU Provider Note (Signed)
  History     CSN: [id number]  Arrival date and time: 07/20/13 4098   None     Chief Complaint  Patient presents with  . Contractions   HPI Patient is a 23 yo female G1P0 at 40.5 weeks presenting with onset of contractions. States they started around 10 pm yesterday and they were 15 minutes apart. The contractions have become more frequent since that time. She notes no bleeding or loss of fluid. She reports good fetal movement. She notes she started vomiting yesterday. Has vomitied 2-3x. Non-bloody vomitus. Has not been able to take much in PO, with only wanting to eat soups. She denies abdominal pain and diarrhea with this.  OB History   Grav Para Term Preterm Abortions TAB SAB Ect Mult Living   1 1 1       1       History reviewed. No pertinent past medical history.  Past Surgical History  Procedure Laterality Date  . Wisdom tooth extraction      Family History  Problem Relation Age of Onset  . Hypertension Father   . Diabetes Father   . Stroke Father     History  Substance Use Topics  . Smoking status: Former Smoker -- 0.50 packs/day    Types: Cigarettes    Quit date: 06/05/2013  . Smokeless tobacco: Not on file  . Alcohol Use: No     Comment: occ- stopped with pregnancy    Allergies:  Allergies  Allergen Reactions  . Latex Itching and Swelling    No prescriptions prior to admission    ROS see HPI Physical Exam   Blood pressure 83/41, pulse 67, temperature 98 F (36.7 C), temperature source Oral, resp. rate 18, height 5\' 3"  (1.6 m), weight 56.7 kg (125 lb), last menstrual period 09/21/2012, SpO2 99.00%, unknown if currently breastfeeding.  Physical Exam  Constitutional: She appears well-developed and well-nourished.  Vomiting during part of the exam  HENT:  Head: Normocephalic and atraumatic.  Mouth/Throat: Oropharynx is clear and moist.  GI: Soft. There is no tenderness.  Gravid   Musculoskeletal: She exhibits no edema.  Neurological: She is  alert.  Skin: Skin is warm and dry.   Fetal monitoring: FHR 135, moderate variability, no accels, no decels Contractions every 2 minutes  MAU Course  Procedures  MDM Patient presents with onset of contractions, though no cervical change on recheck. Fetal tracing is reassuring though not reactive and given this in combination with her dating of 29.5 we will admit to L&D for anticipated NSVD.  Assessment and Plan  Patient is a 23 yo female G1P0 at 40.5 weeks presenting with onset of contractions and reassuring, though non-reactive fetal monitoring. Will admit to L&D for expectant management and fetal monitoring. Vomiting potentially related to a gastroenteritis. Will treat nausea with zofran. Does not appear dehydrated on exam, though will start LR at 125/hr. Expect NSVD.   I have seen and examined this patient and I agree with the above. Note originally written by Dr Birdie Sons (see H&P). Arabella Merles 9:12 PM 07/24/2013     07/24/2013, 9:11 PM

## 2013-08-01 NOTE — MAU Provider Note (Signed)
  History     CSN: [id number]  Arrival date and time: 07/20/13 1410   None     Chief Complaint  Patient presents with  . Contractions   HPI Patient is a 23 yo female G1P0 at 40.5 weeks presenting with onset of contractions. States they started around 10 pm yesterday and they were 15 minutes apart. The contractions have become more frequent since that time. She notes no bleeding or loss of fluid. She reports good fetal movement. She notes she started vomiting yesterday. Has vomitied 2-3x. Non-bloody vomitus. Has not been able to take much in PO, with only wanting to eat soups. She denies abdominal pain and diarrhea with this.  OB History   Grav Para Term Preterm Abortions TAB SAB Ect Mult Living   1 1 1       1       History reviewed. No pertinent past medical history.  Past Surgical History  Procedure Laterality Date  . Wisdom tooth extraction      Family History  Problem Relation Age of Onset  . Hypertension Father   . Diabetes Father   . Stroke Father     History  Substance Use Topics  . Smoking status: Former Smoker -- 0.50 packs/day    Types: Cigarettes    Quit date: 06/05/2013  . Smokeless tobacco: Not on file  . Alcohol Use: No     Comment: occ- stopped with pregnancy    Allergies:  Allergies  Allergen Reactions  . Latex Itching and Swelling    No prescriptions prior to admission    ROS see HPI Physical Exam   Blood pressure 83/41, pulse 67, temperature 98 F (36.7 C), temperature source Oral, resp. rate 18, height 5\' 3"  (1.6 m), weight 56.7 kg (125 lb), last menstrual period 09/21/2012, SpO2 99.00%, unknown if currently breastfeeding.  Physical Exam  Constitutional: She appears well-developed and well-nourished.  Vomiting during part of the exam  HENT:  Head: Normocephalic and atraumatic.  Mouth/Throat: Oropharynx is clear and moist.  GI: Soft. There is no tenderness.  Gravid   Musculoskeletal: She exhibits no edema.  Neurological: She is  alert.  Skin: Skin is warm and dry.   Fetal monitoring: FHR 135, moderate variability, no accels, no decels Contractions every 2 minutes  MAU Course  Procedures  MDM Patient presents with onset of contractions, though no cervical change on recheck. Fetal tracing is reassuring though not reactive and given this in combination with her dating of 19.5 we will admit to L&D for anticipated NSVD.  Assessment and Plan  Patient is a 23 yo female G1P0 at 40.5 weeks presenting with onset of contractions and reassuring, though non-reactive fetal monitoring. Will admit to L&D for expectant management and fetal monitoring. Vomiting potentially related to a gastroenteritis. Will treat nausea with zofran. Does not appear dehydrated on exam, though will start LR at 125/hr. Expect NSVD.   Cam Hai 08/01/2013, 12:41 AM   Original note by Wynonia Musty MD; see H&P.

## 2013-08-14 ENCOUNTER — Encounter (HOSPITAL_COMMUNITY): Payer: Self-pay | Admitting: *Deleted

## 2013-08-14 ENCOUNTER — Inpatient Hospital Stay (HOSPITAL_COMMUNITY)
Admission: AD | Admit: 2013-08-14 | Discharge: 2013-08-14 | Disposition: A | Discharge status: Home / Self Care | Payer: Medicaid Other | Source: Ambulatory Visit | Attending: Obstetrics & Gynecology | Admitting: Obstetrics & Gynecology

## 2013-08-14 DIAGNOSIS — Z87891 Personal history of nicotine dependence: Secondary | ICD-10-CM | POA: Insufficient documentation

## 2013-08-14 DIAGNOSIS — O9883 Other maternal infectious and parasitic diseases complicating the puerperium: Secondary | ICD-10-CM | POA: Insufficient documentation

## 2013-08-14 DIAGNOSIS — A599 Trichomoniasis, unspecified: Secondary | ICD-10-CM

## 2013-08-14 DIAGNOSIS — N949 Unspecified condition associated with female genital organs and menstrual cycle: Secondary | ICD-10-CM | POA: Insufficient documentation

## 2013-08-14 DIAGNOSIS — K5900 Constipation, unspecified: Secondary | ICD-10-CM | POA: Insufficient documentation

## 2013-08-14 DIAGNOSIS — R109 Unspecified abdominal pain: Secondary | ICD-10-CM | POA: Insufficient documentation

## 2013-08-14 DIAGNOSIS — A5901 Trichomonal vulvovaginitis: Secondary | ICD-10-CM | POA: Insufficient documentation

## 2013-08-14 LAB — URINALYSIS, ROUTINE W REFLEX MICROSCOPIC
BILIRUBIN URINE: NEGATIVE
Glucose, UA: NEGATIVE mg/dL
KETONES UR: 15 mg/dL — AB
NITRITE: NEGATIVE
PROTEIN: NEGATIVE mg/dL
Specific Gravity, Urine: 1.025 (ref 1.005–1.030)
UROBILINOGEN UA: 1 mg/dL (ref 0.0–1.0)
pH: 6 (ref 5.0–8.0)

## 2013-08-14 LAB — URINE MICROSCOPIC-ADD ON

## 2013-08-14 LAB — WET PREP, GENITAL
CLUE CELLS WET PREP: NONE SEEN
Yeast Wet Prep HPF POC: NONE SEEN

## 2013-08-14 MED ORDER — METRONIDAZOLE 500 MG PO TABS
2000.0000 mg | ORAL_TABLET | Freq: Once | ORAL | Status: AC
  Administered 2013-08-14: 2000 mg via ORAL
  Filled 2013-08-14: qty 4

## 2013-08-14 MED ORDER — DOCUSATE SODIUM 100 MG PO CAPS: 100.0000 mg | ORAL_CAPSULE | Freq: Two times a day (BID) | ORAL | Status: DC

## 2013-08-14 MED ORDER — POLYETHYLENE GLYCOL 3350 17 GM/SCOOP PO POWD: ORAL | Status: DC

## 2013-08-14 NOTE — Discharge Instructions (Signed)
Trichomoniasis Trichomoniasis is an infection caused by an organism called Trichomonas. The infection can affect both women and men. In women, the outer female genitalia and the vagina are affected. In men, the penis is mainly affected, but the prostate and other reproductive organs can also be involved. Trichomoniasis is a sexually transmitted infection (STI) and is most often passed to another person through sexual contact.  RISK FACTORS  Having unprotected sexual intercourse.  Having sexual intercourse with an infected partner. SIGNS AND SYMPTOMS  Symptoms of trichomoniasis in women include:  Abnormal gray-green frothy vaginal discharge.  Itching and irritation of the vagina.  Itching and irritation of the area outside the vagina. Symptoms of trichomoniasis in men include:   Penile discharge with or without pain.  Pain during urination. This results from inflammation of the urethra. DIAGNOSIS  Trichomoniasis may be found during a Pap test or physical exam. Your health care provider may use one of the following methods to help diagnose this infection:  Examining vaginal discharge under a microscope. For men, urethral discharge would be examined.  Testing the pH of the vagina with a test tape.  Using a vaginal swab test that checks for the Trichomonas organism. A test is available that provides results within a few minutes.  Doing a culture test for the organism. This is not usually needed. TREATMENT   You may be given medicine to fight the infection. Women should inform their health care provider if they could be or are pregnant. Some medicines used to treat the infection should not be taken during pregnancy.  Your health care provider may recommend over-the-counter medicines or creams to decrease itching or irritation.  Your sexual partner will need to be treated if infected. HOME CARE INSTRUCTIONS   Take all medicine prescribed by your health care provider.  Take  over-the-counter medicine for itching or irritation as directed by your health care provider.  Do not have sexual intercourse while you have the infection.  Women should not douche or wear tampons while they have the infection.  Discuss your infection with your partner. Your partner may have gotten the infection from you, or you may have gotten it from your partner.  Have your sex partner get examined and treated if necessary.  Practice safe, informed, and protected sex.  See your health care provider for other STI testing. SEEK MEDICAL CARE IF:   You still have symptoms after you finish your medicine.  You develop abdominal pain.  You have pain when you urinate.  You have bleeding after sexual intercourse.  You develop a rash.  Your medicine makes you sick or makes you throw up (vomit). Document Released: 08/03/2000 Document Revised: 02/12/2013 Document Reviewed: 11/19/2012 [facility] LLCExitCare Patient Information 2015 [place], [place]. This information is not intended to replace advice given to you by your health care provider. Make sure you discuss any questions you have with your health care provider.  Fiber Content in Foods Drinking plenty of fluids and consuming foods high in fiber can help with constipation. See the list below for the fiber content of some common foods. Starches and Grains / Dietary Fiber (g)  Cheerios, 1 cup / 3 g  Kellogg's Corn Flakes, 1 cup / 0.7 g  Rice Krispies, 1  cup / 0.3 g  Quaker Oat Life Cereal,  cup / 2.1 g  Oatmeal, instant (cooked),  cup / 2 g  Kellogg's Frosted Mini Wheats, 1 cup / 5.1 g  Rice, brown, long-grain (cooked), 1 cup / 3.5 g  Rice, white, long-grain (cooked), 1 cup / 0.6 g  Macaroni, cooked, enriched, 1 cup / 2.5 g Legumes / Dietary Fiber (g)  Beans, baked, canned, plain or vegetarian,  cup / 5.2 g  Beans, kidney, canned,  cup / 6.8 g  Beans, pinto, dried (cooked),  cup / 7.7 g  Beans, pinto, canned,  cup / 5.5  g Breads and Crackers / Dietary Fiber (g)  Graham crackers, plain or honey, 2 squares / 0.7 g  Saltine crackers, 3 squares / 0.3 g  Pretzels, plain, salted, 10 pieces / 1.8 g  Bread, whole-wheat, 1 slice / 1.9 g  Bread, white, 1 slice / 0.7 g  Bread, raisin, 1 slice / 1.2 g  Bagel, plain, 3 oz / 2 g  Tortilla, flour, 1 oz / 0.9 g  Tortilla, corn, 1 small / 1.5 g  Bun, hamburger or hotdog, 1 small / 0.9 g Fruits / Dietary Fiber (g)  Apple, raw with skin, 1 medium / 4.4 g  Applesauce, sweetened,  cup / 1.5 g  Banana,  medium / 1.5 g  Grapes, 10 grapes / 0.4 g  Orange, 1 small / 2.3 g  Raisin, 1.5 oz / 1.6 g  Melon, 1 cup / 1.4 g Vegetables / Dietary Fiber (g)  Green beans, canned,  cup / 1.3 g  Carrots (cooked),  cup / 2.3 g  Broccoli (cooked),  cup / 2.8 g  Peas, frozen (cooked),  cup / 4.4 g  Potatoes, mashed,  cup / 1.6 g  Lettuce, 1 cup / 0.5 g  Corn, canned,  cup / 1.6 g  Tomato,  cup / 1.1 g Document Released: 06/26/2006 Document Revised: 05/02/2011 Document Reviewed: 08/21/2006 ExitCare Patient Information 2015 [place] ValleyExitCare, [place]. This information is not intended to replace advice given to you by your health care provider. Make sure you discuss any questions you have with your health care provider.  Constipation Constipation is when a person:  Poops (has a bowel movement) less than 3 times a week.  Has a hard time pooping.  Has poop that is dry, hard, or bigger than normal. HOME CARE   Eat foods with a lot of fiber in them. This includes fruits, vegetables, beans, and whole grains such as brown rice.  Avoid fatty foods and foods with a lot of sugar. This includes french fries, hamburgers, cookies, candy, and soda.  If you are not getting enough fiber from food, take products with added fiber in them (supplements).  Drink enough fluid to keep your pee (urine) clear or pale yellow.  Exercise on a regular basis, or as told by your  doctor.  Go to the restroom when you feel like you need to poop. Do not hold it.  Only take medicine as told by your doctor. Do not take medicines that help you poop (laxatives) without talking to your doctor first. GET HELP RIGHT AWAY IF:   You have bright red blood in your poop (stool).  Your constipation lasts more than 4 days or gets worse.  You have belly (abdominal) or butt (rectal) pain.  You have thin poop (as thin as a pencil).  You lose weight, and it cannot be explained. MAKE SURE YOU:   Understand these instructions.  Will watch your condition.  Will get help right away if you are not doing well or get worse. Document Released: 07/27/2007 Document Revised: 02/12/2013 Document Reviewed: 11/19/2012 [facility] LLCExitCare Patient Information 2015 [place] ValleyExitCare, MarylandLLC. This information is not intended to replace advice  given to you by your health care provider. Make sure you discuss any questions you have with your health care provider.

## 2013-08-14 NOTE — MAU Note (Signed)
Vag delivery on 5/30, lower abd cramping x 2 days, vaginal irritation, had vulvar swelling yesterday, no bleeding.  Rectal area irritated.

## 2013-08-14 NOTE — MAU Note (Signed)
C/o  2 different types of cramping; states that she has an abdominal cramping that has persisted since delivery; c/o "vaginal" drawing or cramping for past week; c/o constipation for past 3 days; c/o external vaginal & anal irritation;

## 2013-08-14 NOTE — MAU Provider Note (Signed)
History     CSN: [id number]  Arrival date and time: 08/14/13 1507   First Lakyra Tippins Initiated Contact with Patient 08/14/13 1616      Chief Complaint  Patient presents with  . Abdominal Pain  . vaginal irritation    HPI Ms. Pearla DubonnetSabrina N Royse is a 23 y.o. G2P1002 who had SVD on 07/20/13 who presents to MAU today with multiple complaints. The patient states pain with BM since ~ 1 week PP. She states occasional scant blood noted with wiping after BM. She endorses associated constipation. She also states that she has had mild lower abdominal cramping since delivery. She continues to have light bleeding and is breastfeeding. She also endorses a "cramping" feeling and irritation of the external genitalia. She is unsure about rash. She states a white discharge that is sometime pink. She has continued to have bleeding off and on since delivery and the bleeding is light now. She rates her abdominal pain at 5/10 and her vaginal discomfort at 8/10 now. She denies intercourse since delivery, but has had oral sex. She denies fever.   OB History   Grav Para Term Preterm Abortions TAB SAB Ect Mult Living   2 2 1       2       History reviewed. No pertinent past medical history.  Past Surgical History  Procedure Laterality Date  . Wisdom tooth extraction      Family History  Problem Relation Age of Onset  . Hypertension Father   . Diabetes Father   . Stroke Father     History  Substance Use Topics  . Smoking status: Former Smoker -- 0.50 packs/day    Types: Cigarettes    Quit date: 06/05/2013  . Smokeless tobacco: Not on file  . Alcohol Use: No     Comment: occ- stopped with pregnancy    Allergies:  Allergies  Allergen Reactions  . Latex Itching and Swelling    No prescriptions prior to admission    Review of Systems  Gastrointestinal: Positive for abdominal pain and constipation. Negative for nausea, vomiting, diarrhea and blood in stool.  Genitourinary:       + vaginal  bleeding, discharge   Physical Exam   Blood pressure 117/76, pulse 77, temperature 98.7 F (37.1 C), temperature source Oral, resp. rate 18, last menstrual period 09/21/2012, SpO2 98.00%, unknown if currently breastfeeding.  Physical Exam  Constitutional: She is oriented to person, place, and time. She appears well-developed and well-nourished. No distress.  HENT:  Head: Normocephalic.  Cardiovascular: Normal rate.   Respiratory: Effort normal.  GI: Soft. She exhibits no distension and no mass. There is no tenderness. There is no rebound and no guarding.  Genitourinary:    There is tenderness and lesion on the right labia. There is no rash on the right labia. There is tenderness and lesion on the left labia. There is no rash on the left labia. Cervix exhibits no motion tenderness, no discharge and no friability. There is bleeding (scant light brown blood) around the vagina. Vaginal discharge (small amount of frothy brown discharge noted) found.  Neurological: She is alert and oriented to person, place, and time.  Skin: Skin is warm and dry. No erythema.  Psychiatric: She has a normal mood and affect.   Results for orders placed during the hospital encounter of 08/14/13 (from the past 24 hour(s))  URINALYSIS, ROUTINE W REFLEX MICROSCOPIC     Status: Abnormal   Collection Time    08/14/13  3:27  PM      Result Value Ref Range   Color, Urine YELLOW  YELLOW   APPearance HAZY (*) CLEAR   Specific Gravity, Urine 1.025  1.005 - 1.030   pH 6.0  5.0 - 8.0   Glucose, UA NEGATIVE  NEGATIVE mg/dL   Hgb urine dipstick LARGE (*) NEGATIVE   Bilirubin Urine NEGATIVE  NEGATIVE   Ketones, ur 15 (*) NEGATIVE mg/dL   Protein, ur NEGATIVE  NEGATIVE mg/dL   Urobilinogen, UA 1.0  0.0 - 1.0 mg/dL   Nitrite NEGATIVE  NEGATIVE   Leukocytes, UA SMALL (*) NEGATIVE  URINE MICROSCOPIC-ADD ON     Status: Abnormal   Collection Time    08/14/13  3:27 PM      Result Value Ref Range   Squamous Epithelial /  LPF FEW (*) RARE   WBC, UA 21-50  <3 WBC/hpf   RBC / HPF 7-10  <3 RBC/hpf   Urine-Other TRICHOMONAS PRESENT    WET PREP, GENITAL     Status: Abnormal   Collection Time    08/14/13  4:40 PM      Result Value Ref Range   Yeast Wet Prep HPF POC NONE SEEN  NONE SEEN   Trich, Wet Prep FEW (*) NONE SEEN   Clue Cells Wet Prep HPF POC NONE SEEN  NONE SEEN   WBC, Wet Prep HPF POC MANY (*) NONE SEEN     MAU Course  Procedures None  MDM UA, wet prep today 2G Flagyl given in MAU  Assessment and Plan  A: Trichomonas Post partum pelvic pain Constipation  P: Discharge home Rx for Miralax and Colace sent to patient's pharmacy Patient treated in MAU with Flagyl. Partner treatment advised Patient advised to follow-up with [facility] as scheduled for routine postpartum exam and birth control initiation Patient may return to MAU as needed or if her condition were to change or worsen  Freddi StarrJulie N Ethier, PA-C  08/14/2013, 5:48 PM

## 2013-08-15 NOTE — MAU Provider Note (Signed)
Attestation of Attending Supervision of Advanced Practitioner (CNM/NP): Evaluation and management procedures were performed by the Advanced Practitioner under my supervision and collaboration.  I have reviewed the Advanced Practitioner's note and chart, and I agree with the management and plan.  HARRAWAY-SMITH, CAROLYN 3:39 PM     

## 2013-09-25 ENCOUNTER — Ambulatory Visit (INDEPENDENT_AMBULATORY_CARE_PROVIDER_SITE_OTHER): Payer: Medicaid Other | Admitting: Obstetrics and Gynecology

## 2013-09-25 VITALS — BP 121/87 | HR 90 | Ht 63.0 in | Wt 99.0 lb

## 2013-09-25 VITALS — BP 121/87 | HR 90 | Ht 63.0 in | Wt 99.5 lb

## 2013-09-25 DIAGNOSIS — Z3049 Encounter for surveillance of other contraceptives: Secondary | ICD-10-CM

## 2013-09-25 LAB — POCT PREGNANCY, URINE: Preg Test, Ur: NEGATIVE

## 2013-09-25 MED ORDER — SERTRALINE HCL 50 MG PO TABS: 50.0000 mg | ORAL_TABLET | Freq: Every day | ORAL | Status: DC

## 2013-09-25 MED ORDER — MEDROXYPROGESTERONE ACETATE 104 MG/0.65ML ~~LOC~~ SUSP
104.0000 mg | SUBCUTANEOUS | Status: DC
  Administered 2013-09-25 – 2013-12-18 (×2): 104 mg via SUBCUTANEOUS

## 2013-09-25 NOTE — Progress Notes (Signed)
  Subjective:     Lisa Holt is a 23 y.o. female who presents for a postpartum visit. She is 8 weeks postpartum following a spontaneous vaginal delivery. I have fully reviewed the prenatal and intrapartum course. The delivery was at 40.5 gestational weeks. Outcome: spontaneous vaginal delivery. Anesthesia: epidural. Postpartum course has been uncomplicated. Baby's course has been uncomplicaed. Baby is feeding by breast. Bleeding no bleeding. Bowel function is normal. Bladder function is normal. Patient is not sexually active. Contraception method is none. Postpartum depression screening: positive. Patient denies any suicidal or homicidal ideation. Patient does report several crying spells. She has made contact with a therapist.     Review of Systems A comprehensive review of systems was negative.   Objective:    LMP 09/21/2012  General:  alert, cooperative and no distress   Breasts:  inspection negative, no nipple discharge or bleeding, no masses or nodularity palpable  Lungs: clear to auscultation bilaterally  Heart:  regular rate and rhythm  Abdomen: soft, non-tender; bowel sounds normal; no masses,  no organomegaly   Vulva:  normal  Vagina: normal vagina, no discharge, exudate, lesion, or erythema  Cervix:  multiparous appearance  Corpus: normal size, contour, position, consistency, mobility, non-tender  Adnexa:  normal adnexa and no mass, fullness, tenderness  Rectal Exam: Not performed.        Assessment:     Normal postpartum exam. Pap smear not done at today's visit.   Plan:    1. Contraception: Depo-Provera injections 2. Patient is medically cleared to resume all activities of daily living 3. Will start zoloft 4. Follow up in: 1 year or as needed.

## 2013-09-25 NOTE — Progress Notes (Signed)
Patient is going to a therapist for her depression.

## 2013-10-11 ENCOUNTER — Emergency Department (HOSPITAL_COMMUNITY)
Admission: EM | Admit: 2013-10-11 | Discharge: 2013-10-11 | Disposition: A | Discharge status: Home / Self Care | Payer: Medicaid Other | Attending: Emergency Medicine | Admitting: Emergency Medicine

## 2013-10-11 ENCOUNTER — Encounter (HOSPITAL_COMMUNITY): Payer: Self-pay | Admitting: Emergency Medicine

## 2013-10-11 DIAGNOSIS — IMO0002 Reserved for concepts with insufficient information to code with codable children: Secondary | ICD-10-CM | POA: Insufficient documentation

## 2013-10-11 DIAGNOSIS — R109 Unspecified abdominal pain: Secondary | ICD-10-CM | POA: Insufficient documentation

## 2013-10-11 DIAGNOSIS — S336XXA Sprain of sacroiliac joint, initial encounter: Secondary | ICD-10-CM | POA: Insufficient documentation

## 2013-10-11 DIAGNOSIS — R42 Dizziness and giddiness: Secondary | ICD-10-CM | POA: Insufficient documentation

## 2013-10-11 DIAGNOSIS — T148XXA Other injury of unspecified body region, initial encounter: Secondary | ICD-10-CM

## 2013-10-11 DIAGNOSIS — N760 Acute vaginitis: Secondary | ICD-10-CM | POA: Insufficient documentation

## 2013-10-11 DIAGNOSIS — X58XXXA Exposure to other specified factors, initial encounter: Secondary | ICD-10-CM | POA: Insufficient documentation

## 2013-10-11 DIAGNOSIS — Z87891 Personal history of nicotine dependence: Secondary | ICD-10-CM | POA: Insufficient documentation

## 2013-10-11 DIAGNOSIS — N898 Other specified noninflammatory disorders of vagina: Secondary | ICD-10-CM | POA: Diagnosis not present

## 2013-10-11 DIAGNOSIS — Z3202 Encounter for pregnancy test, result negative: Secondary | ICD-10-CM | POA: Diagnosis not present

## 2013-10-11 DIAGNOSIS — B9689 Other specified bacterial agents as the cause of diseases classified elsewhere: Secondary | ICD-10-CM | POA: Insufficient documentation

## 2013-10-11 DIAGNOSIS — Z91040 Latex allergy status: Secondary | ICD-10-CM | POA: Insufficient documentation

## 2013-10-11 DIAGNOSIS — A499 Bacterial infection, unspecified: Secondary | ICD-10-CM | POA: Diagnosis not present

## 2013-10-11 DIAGNOSIS — Y939 Activity, unspecified: Secondary | ICD-10-CM | POA: Insufficient documentation

## 2013-10-11 DIAGNOSIS — Z79899 Other long term (current) drug therapy: Secondary | ICD-10-CM | POA: Diagnosis not present

## 2013-10-11 DIAGNOSIS — Y929 Unspecified place or not applicable: Secondary | ICD-10-CM | POA: Diagnosis not present

## 2013-10-11 LAB — CBC WITH DIFFERENTIAL/PLATELET
BASOS PCT: 0 % (ref 0–1)
Basophils Absolute: 0 10*3/uL (ref 0.0–0.1)
EOS ABS: 0.1 10*3/uL (ref 0.0–0.7)
EOS PCT: 2 % (ref 0–5)
HCT: 40.9 % (ref 36.0–46.0)
HEMOGLOBIN: 13.4 g/dL (ref 12.0–15.0)
LYMPHS ABS: 2.7 10*3/uL (ref 0.7–4.0)
Lymphocytes Relative: 35 % (ref 12–46)
MCH: 27.7 pg (ref 26.0–34.0)
MCHC: 32.8 g/dL (ref 30.0–36.0)
MCV: 84.7 fL (ref 78.0–100.0)
MONO ABS: 0.5 10*3/uL (ref 0.1–1.0)
MONOS PCT: 6 % (ref 3–12)
Neutro Abs: 4.3 10*3/uL (ref 1.7–7.7)
Neutrophils Relative %: 57 % (ref 43–77)
Platelets: 199 10*3/uL (ref 150–400)
RBC: 4.83 MIL/uL (ref 3.87–5.11)
RDW: 15.7 % — ABNORMAL HIGH (ref 11.5–15.5)
WBC: 7.5 10*3/uL (ref 4.0–10.5)

## 2013-10-11 LAB — URINE MICROSCOPIC-ADD ON

## 2013-10-11 LAB — COMPREHENSIVE METABOLIC PANEL
ALBUMIN: 4 g/dL (ref 3.5–5.2)
ALT: 32 U/L (ref 0–35)
ANION GAP: 10 (ref 5–15)
AST: 27 U/L (ref 0–37)
Alkaline Phosphatase: 54 U/L (ref 39–117)
BUN: 12 mg/dL (ref 6–23)
CALCIUM: 9.4 mg/dL (ref 8.4–10.5)
CO2: 27 mEq/L (ref 19–32)
CREATININE: 0.85 mg/dL (ref 0.50–1.10)
Chloride: 106 mEq/L (ref 96–112)
GFR calc non Af Amer: >90 mL/min (ref >90)
GLUCOSE: 86 mg/dL (ref 70–99)
Potassium: 3.8 mEq/L (ref 3.7–5.3)
Sodium: 143 mEq/L (ref 137–147)
TOTAL PROTEIN: 7.1 g/dL (ref 6.0–8.3)
Total Bilirubin: 0.3 mg/dL (ref 0.3–1.2)

## 2013-10-11 LAB — URINALYSIS, ROUTINE W REFLEX MICROSCOPIC
BILIRUBIN URINE: NEGATIVE
Glucose, UA: NEGATIVE mg/dL
Ketones, ur: NEGATIVE mg/dL
LEUKOCYTES UA: NEGATIVE
NITRITE: NEGATIVE
PH: 6 (ref 5.0–8.0)
Protein, ur: NEGATIVE mg/dL
SPECIFIC GRAVITY, URINE: 1.023 (ref 1.005–1.030)
Urobilinogen, UA: 1 mg/dL (ref 0.0–1.0)

## 2013-10-11 LAB — POC URINE PREG, ED: Preg Test, Ur: NEGATIVE

## 2013-10-11 LAB — WET PREP, GENITAL
Trich, Wet Prep: NONE SEEN
YEAST WET PREP: NONE SEEN

## 2013-10-11 MED ORDER — METRONIDAZOLE 500 MG PO TABS: 500.0000 mg | ORAL_TABLET | Freq: Two times a day (BID) | ORAL | Status: DC

## 2013-10-11 MED ORDER — NAPROXEN 375 MG PO TABS: 375.0000 mg | ORAL_TABLET | Freq: Two times a day (BID) | ORAL | Status: DC

## 2013-10-11 NOTE — Discharge Instructions (Signed)
Bacterial Vaginosis Bacterial vaginosis is a vaginal infection that occurs when the normal balance of bacteria in the vagina is disrupted. It results from an overgrowth of certain bacteria. This is the most common vaginal infection in women of childbearing age. Treatment is important to prevent complications, especially in pregnant women, as it can cause a premature delivery. CAUSES  Bacterial vaginosis is caused by an increase in harmful bacteria that are normally present in smaller amounts in the vagina. Several different kinds of bacteria can cause bacterial vaginosis. However, the reason that the condition develops is not fully understood. RISK FACTORS Certain activities or behaviors can put you at an increased risk of developing bacterial vaginosis, including:  Having a new sex partner or multiple sex partners.  Douching.  Using an intrauterine device (IUD) for contraception. Women do not get bacterial vaginosis from toilet seats, bedding, swimming pools, or contact with objects around them. SIGNS AND SYMPTOMS  Some women with bacterial vaginosis have no signs or symptoms. Common symptoms include:  Grey vaginal discharge.  A fishlike odor with discharge, especially after sexual intercourse.  Itching or burning of the vagina and vulva.  Burning or pain with urination. DIAGNOSIS  Your health care provider will take a medical history and examine the vagina for signs of bacterial vaginosis. A sample of vaginal fluid may be taken. Your health care provider will look at this sample under a microscope to check for bacteria and abnormal cells. A vaginal pH test may also be done.  TREATMENT  Bacterial vaginosis may be treated with antibiotic medicines. These may be given in the form of a pill or a vaginal cream. A second round of antibiotics may be prescribed if the condition comes back after treatment.  HOME CARE INSTRUCTIONS   Only take over-the-counter or prescription medicines as  directed by your health care provider.  If antibiotic medicine was prescribed, take it as directed. Make sure you finish it even if you start to feel better.  Do not have sex until treatment is completed.  Tell all sexual partners that you have a vaginal infection. They should see their health care provider and be treated if they have problems, such as a mild rash or itching.  Practice safe sex by using condoms and only having one sex partner. SEEK MEDICAL CARE IF:   Your symptoms are not improving after 3 days of treatment.  You have increased discharge or pain.  You have a fever. MAKE SURE YOU:   Understand these instructions.  Will watch your condition.  Will get help right away if you are not doing well or get worse. FOR MORE INFORMATION  Centers for Disease Control and Prevention, Division of STD Prevention: SolutionApps.co.zawww.cdc.gov/std American Sexual Health Association (ASHA): www.ashastd.org  Document Released: 02/07/2005 Document Revised: 11/28/2012 Document Reviewed: 09/19/2012 [facility] Patient Information 2015 [place], [place]. This information is not intended to replace advice given to you by your health care provider. Make sure you discuss any questions you have with your health care provider.  Muscle Strain A muscle strain is an injury that occurs when a muscle is stretched beyond its normal length. Usually a small number of muscle fibers are torn when this happens. Muscle strain is rated in degrees. First-degree strains have the least amount of muscle fiber tearing and pain. Second-degree and third-degree strains have increasingly more tearing and pain.  Usually, recovery from muscle strain takes 1-2 weeks. Complete healing takes 5-6 weeks.  CAUSES  Muscle strain happens when a sudden, violent force  placed on a muscle stretches it too far. This may occur with lifting, sports, or a fall.  RISK FACTORS Muscle strain is especially common in athletes.  SIGNS AND SYMPTOMS At the site  of the muscle strain, there may be:  Pain.  Bruising.  Swelling.  Difficulty using the muscle due to pain or lack of normal function. DIAGNOSIS  Your health care provider will perform a physical exam and ask about your medical history. TREATMENT  Often, the best treatment for a muscle strain is resting, icing, and applying cold compresses to the injured area.  HOME CARE INSTRUCTIONS   Use the PRICE method of treatment to promote muscle healing during the first 2-3 days after your injury. The PRICE method involves:  Protecting the muscle from being injured again.  Restricting your activity and resting the injured body part.  Icing your injury. To do this, put ice in a plastic bag. Place a towel between your skin and the bag. Then, apply the ice and leave it on from 15-20 minutes each hour. After the third day, switch to moist heat packs.  Apply compression to the injured area with a splint or elastic bandage. Be careful not to wrap it too tightly. This may interfere with blood circulation or increase swelling.  Elevate the injured body part above the level of your heart as often as you can.  Only take over-the-counter or prescription medicines for pain, discomfort, or fever as directed by your health care provider.  Warming up prior to exercise helps to prevent future muscle strains. SEEK MEDICAL CARE IF:   You have increasing pain or swelling in the injured area.  You have numbness, tingling, or a significant loss of strength in the injured area. MAKE SURE YOU:   Understand these instructions.  Will watch your condition.  Will get help right away if you are not doing well or get worse. Document Released: 02/07/2005 Document Revised: 11/28/2012 Document Reviewed: 09/06/2012 [facility] Patient Information 2015 [place], [place]. This information is not intended to replace advice given to you by your health care provider. Make sure you discuss any questions you have with your  health care provider.

## 2013-10-11 NOTE — ED Provider Notes (Signed)
CSN: [id number]     Arrival date & time 10/11/13  1331 History   First MD Initiated Contact with Patient 10/11/13 1843     Chief Complaint  Patient presents with  . Abdominal Pain  . Dizziness     (Consider location/radiation/quality/duration/timing/severity/associated sxs/prior Treatment) HPI Comments: Patient presents with lower abdominal pain. She is 2 months status post vaginal delivery. She no complications with the birth. She had normal recent followup visit with her OB/GYN and was cleared to go back to normal activities. She states she's had some discomfort in her left shoulder and lower back over the last month. Over the last 2 days she's had some discomfort to her lower abdomen. She just started having some vaginal bleeding while she was here in the waiting room. This is her first bleeding since the birth. She denies any abnormal discharge. She denies any urinary symptoms. She denies any fevers or chills. She had one episode of vomiting when this pain started but no nausea or vomiting since that time. She also has had several episodes of feeling lightheaded over the last few weeks.  Patient is a 23 y.o. female presenting with abdominal pain and dizziness.  Abdominal Pain Associated symptoms: vaginal bleeding   Associated symptoms: no chest pain, no chills, no cough, no diarrhea, no fatigue, no fever, no hematuria, no nausea, no shortness of breath, no vaginal discharge and no vomiting   Dizziness Associated symptoms: no blood in stool, no chest pain, no diarrhea, no headaches, no nausea, no shortness of breath and no vomiting     History reviewed. No pertinent past medical history. Past Surgical History  Procedure Laterality Date  . Wisdom tooth extraction     Family History  Problem Relation Age of Onset  . Hypertension Father   . Diabetes Father   . Stroke Father    History  Substance Use Topics  . Smoking status: Former Smoker -- 0.50 packs/day    Types: Cigarettes   Quit date: 06/05/2013  . Smokeless tobacco: Not on file  . Alcohol Use: No     Comment: occ- stopped with pregnancy   OB History   Grav Para Term Preterm Abortions TAB SAB Ect Mult Living   2 2 1       2      Review of Systems  Constitutional: Negative for fever, chills, diaphoresis and fatigue.  HENT: Negative for congestion, rhinorrhea and sneezing.   Eyes: Negative.   Respiratory: Negative for cough, chest tightness and shortness of breath.   Cardiovascular: Negative for chest pain and leg swelling.  Gastrointestinal: Positive for abdominal pain. Negative for nausea, vomiting, diarrhea and blood in stool.  Genitourinary: Positive for vaginal bleeding. Negative for frequency, hematuria, flank pain, vaginal discharge and difficulty urinating.  Musculoskeletal: Positive for myalgias. Negative for arthralgias and back pain.  Skin: Negative for rash.  Neurological: Positive for dizziness. Negative for speech difficulty, weakness, numbness and headaches.      Allergies  Latex  Home Medications   Prior to Admission medications   Medication Sig Start Date End Date Taking? Authorizing Provider  medroxyPROGESTERone (DEPO-SUBQ PROVERA 104) 104 MG/0.65ML injection Inject 104 mg into the skin every 3 (three) months.   Yes Historical Provider, MD  Multiple Vitamins-Minerals (MULTIVITAMIN PO) Take 1 tablet by mouth every other day.   Yes Historical Provider, MD  Prenatal Vit-Fe Fumarate-FA (PRENATAL MULTIVITAMIN) TABS tablet Take 1 tablet by mouth every other day.   Yes Historical Provider, MD  sertraline (ZOLOFT) 50 MG  tablet Take 1 tablet (50 mg total) by mouth daily. 09/25/13  Yes Peggy Constant, MD  metroNIDAZOLE (FLAGYL) 500 MG tablet Take 1 tablet (500 mg total) by mouth 2 (two) times daily. One po bid x 7 days 10/11/13   Rolan Bucco, MD  naproxen (NAPROSYN) 375 MG tablet Take 1 tablet (375 mg total) by mouth 2 (two) times daily. 10/11/13   Rolan Bucco, MD   BP 100/57  Pulse 82   Temp(Src) 98.7 F (37.1 C) (Oral)  Resp 18  SpO2 96%  LMP 09/21/2012 Physical Exam  Constitutional: She is oriented to person, place, and time. She appears well-developed and well-nourished.  HENT:  Head: Normocephalic and atraumatic.  Eyes: Pupils are equal, round, and reactive to light.  Neck: Normal range of motion. Neck supple.  Cardiovascular: Normal rate, regular rhythm and normal heart sounds.   Pulmonary/Chest: Effort normal and breath sounds normal. No respiratory distress. She has no wheezes. She has no rales. She exhibits no tenderness.  Abdominal: Soft. Bowel sounds are normal. There is tenderness (mild tenderness to the suprapubic area). There is no rebound and no guarding.  Musculoskeletal: Normal range of motion. She exhibits no edema.  She has some soreness over her left trapezius muscle and the musculature of the lower back. There's no bony tenderness.  Lymphadenopathy:    She has no cervical adenopathy.  Neurological: She is alert and oriented to person, place, and time. She has normal strength. No sensory deficit.  Skin: Skin is warm and dry. No rash noted.  Psychiatric: She has a normal mood and affect.    ED Course  Procedures (including critical care time) Labs Review Labs Reviewed  WET PREP, GENITAL - Abnormal; Notable for the following:    Clue Cells Wet Prep HPF POC FEW (*)    WBC, Wet Prep HPF POC MANY (*)    All other components within normal limits  CBC WITH DIFFERENTIAL - Abnormal; Notable for the following:    RDW 15.7 (*)    All other components within normal limits  URINALYSIS, ROUTINE W REFLEX MICROSCOPIC - Abnormal; Notable for the following:    Hgb urine dipstick MODERATE (*)    All other components within normal limits  GC/CHLAMYDIA PROBE AMP  COMPREHENSIVE METABOLIC PANEL  URINE MICROSCOPIC-ADD ON  POC URINE PREG, ED    Imaging Review No results found.   EKG Interpretation None      MDM   Final diagnoses:  Muscle strain  BV  (bacterial vaginosis)    Patient presents with lower abdominal pain. She is nontender on abdominal exam currently. Her pelvic exam is unremarkable. She was treated with Flagyl for BV and a possible Trichomonas exposure. She has soreness in her left shoulder and lower back which has been musculoskeletal in nature. There is no bony tenderness. She has no neurologic deficits. She was discharged in good condition. She was given a prescription for Flagyl and Naprosyn. She is not breast-feeding. I encouraged her followup at the [facility] outpatient center if her symptoms are not improving.    Rolan Bucco, MD 10/11/13 2157

## 2013-10-11 NOTE — ED Notes (Signed)
Pt c/o lower abd pain and dizziness x several days; pt 2 months post partum routine vaginal delivery

## 2013-10-15 LAB — GC/CHLAMYDIA PROBE AMP: CT PROBE, AMP APTIMA: UNDETERMINED

## 2013-12-18 ENCOUNTER — Ambulatory Visit (INDEPENDENT_AMBULATORY_CARE_PROVIDER_SITE_OTHER): Payer: Medicaid Other

## 2013-12-18 VITALS — BP 100/59 | HR 85 | Wt 95.9 lb

## 2013-12-18 DIAGNOSIS — Z3042 Encounter for surveillance of injectable contraceptive: Secondary | ICD-10-CM

## 2013-12-18 MED ORDER — MEDROXYPROGESTERONE ACETATE 104 MG/0.65ML ~~LOC~~ SUSP: 104.0000 mg | Freq: Once | SUBCUTANEOUS | Status: DC

## 2013-12-23 ENCOUNTER — Encounter (HOSPITAL_COMMUNITY): Payer: Self-pay | Admitting: Emergency Medicine

## 2014-03-05 ENCOUNTER — Ambulatory Visit: Payer: Medicaid Other

## 2014-03-13 ENCOUNTER — Ambulatory Visit (INDEPENDENT_AMBULATORY_CARE_PROVIDER_SITE_OTHER): Payer: Medicaid Other | Admitting: *Deleted

## 2014-03-13 VITALS — Wt 97.8 lb

## 2014-03-13 DIAGNOSIS — Z3042 Encounter for surveillance of injectable contraceptive: Secondary | ICD-10-CM

## 2014-03-13 MED ORDER — MEDROXYPROGESTERONE ACETATE 104 MG/0.65ML ~~LOC~~ SUSP
104.0000 mg | Freq: Once | SUBCUTANEOUS | Status: AC
  Administered 2014-03-13: 104 mg via SUBCUTANEOUS

## 2014-03-19 ENCOUNTER — Encounter (HOSPITAL_COMMUNITY): Payer: Self-pay | Admitting: Emergency Medicine

## 2014-03-19 ENCOUNTER — Emergency Department (HOSPITAL_COMMUNITY)
Admission: EM | Admit: 2014-03-19 | Discharge: 2014-03-19 | Disposition: A | Discharge status: Home / Self Care | Payer: Medicaid Other | Attending: Emergency Medicine | Admitting: Emergency Medicine

## 2014-03-19 DIAGNOSIS — Y9389 Activity, other specified: Secondary | ICD-10-CM | POA: Diagnosis not present

## 2014-03-19 DIAGNOSIS — S39012A Strain of muscle, fascia and tendon of lower back, initial encounter: Secondary | ICD-10-CM | POA: Diagnosis not present

## 2014-03-19 DIAGNOSIS — O99345 Other mental disorders complicating the puerperium: Secondary | ICD-10-CM

## 2014-03-19 DIAGNOSIS — Z720 Tobacco use: Secondary | ICD-10-CM | POA: Insufficient documentation

## 2014-03-19 DIAGNOSIS — N939 Abnormal uterine and vaginal bleeding, unspecified: Secondary | ICD-10-CM | POA: Insufficient documentation

## 2014-03-19 DIAGNOSIS — Z91040 Latex allergy status: Secondary | ICD-10-CM | POA: Diagnosis not present

## 2014-03-19 DIAGNOSIS — F53 Postpartum depression: Secondary | ICD-10-CM

## 2014-03-19 DIAGNOSIS — X58XXXA Exposure to other specified factors, initial encounter: Secondary | ICD-10-CM | POA: Insufficient documentation

## 2014-03-19 DIAGNOSIS — Y9289 Other specified places as the place of occurrence of the external cause: Secondary | ICD-10-CM | POA: Diagnosis not present

## 2014-03-19 DIAGNOSIS — Z79899 Other long term (current) drug therapy: Secondary | ICD-10-CM | POA: Insufficient documentation

## 2014-03-19 DIAGNOSIS — Z791 Long term (current) use of non-steroidal anti-inflammatories (NSAID): Secondary | ICD-10-CM | POA: Diagnosis not present

## 2014-03-19 DIAGNOSIS — R42 Dizziness and giddiness: Secondary | ICD-10-CM | POA: Diagnosis present

## 2014-03-19 DIAGNOSIS — Y998 Other external cause status: Secondary | ICD-10-CM | POA: Diagnosis not present

## 2014-03-19 HISTORY — DX: Postpartum depression: F53.0

## 2014-03-19 HISTORY — DX: Other mental disorders complicating the puerperium: O99.345

## 2014-03-19 LAB — URINALYSIS, ROUTINE W REFLEX MICROSCOPIC
Bilirubin Urine: NEGATIVE
Glucose, UA: NEGATIVE mg/dL
Hgb urine dipstick: NEGATIVE
Ketones, ur: NEGATIVE mg/dL
LEUKOCYTES UA: NEGATIVE
Nitrite: NEGATIVE
PROTEIN: NEGATIVE mg/dL
Specific Gravity, Urine: 1.018 (ref 1.005–1.030)
Urobilinogen, UA: 1 mg/dL (ref 0.0–1.0)
pH: 7 (ref 5.0–8.0)

## 2014-03-19 LAB — COMPREHENSIVE METABOLIC PANEL
ALK PHOS: 38 U/L — AB (ref 39–117)
ALT: 17 U/L (ref 0–35)
AST: 20 U/L (ref 0–37)
Albumin: 3.8 g/dL (ref 3.5–5.2)
Anion gap: 5 (ref 5–15)
BILIRUBIN TOTAL: 0.6 mg/dL (ref 0.3–1.2)
BUN: 9 mg/dL (ref 6–23)
CALCIUM: 9.2 mg/dL (ref 8.4–10.5)
CO2: 25 mmol/L (ref 19–32)
Chloride: 110 mmol/L (ref 96–112)
Creatinine, Ser: 0.8 mg/dL (ref 0.50–1.10)
GFR calc Af Amer: >90 mL/min (ref >90)
GFR calc non Af Amer: >90 mL/min (ref >90)
GLUCOSE: 93 mg/dL (ref 70–99)
Potassium: 3.7 mmol/L (ref 3.5–5.1)
Sodium: 140 mmol/L (ref 135–145)
TOTAL PROTEIN: 6 g/dL (ref 6.0–8.3)

## 2014-03-19 LAB — WET PREP, GENITAL
Clue Cells Wet Prep HPF POC: NONE SEEN
Trich, Wet Prep: NONE SEEN
Yeast Wet Prep HPF POC: NONE SEEN

## 2014-03-19 LAB — CBC WITH DIFFERENTIAL/PLATELET
BASOS ABS: 0 10*3/uL (ref 0.0–0.1)
BASOS PCT: 0 % (ref 0–1)
Eosinophils Absolute: 0.4 10*3/uL (ref 0.0–0.7)
Eosinophils Relative: 6 % — ABNORMAL HIGH (ref 0–5)
HEMATOCRIT: 35 % — AB (ref 36.0–46.0)
Hemoglobin: 11.9 g/dL — ABNORMAL LOW (ref 12.0–15.0)
LYMPHS PCT: 50 % — AB (ref 12–46)
Lymphs Abs: 2.9 10*3/uL (ref 0.7–4.0)
MCH: 28.4 pg (ref 26.0–34.0)
MCHC: 34 g/dL (ref 30.0–36.0)
MCV: 83.5 fL (ref 78.0–100.0)
MONO ABS: 0.2 10*3/uL (ref 0.1–1.0)
Monocytes Relative: 4 % (ref 3–12)
NEUTROS ABS: 2.3 10*3/uL (ref 1.7–7.7)
NEUTROS PCT: 40 % — AB (ref 43–77)
Platelets: 150 10*3/uL (ref 150–400)
RBC: 4.19 MIL/uL (ref 3.87–5.11)
RDW: 14.6 % (ref 11.5–15.5)
WBC: 5.7 10*3/uL (ref 4.0–10.5)

## 2014-03-19 LAB — LIPASE, BLOOD: LIPASE: 38 U/L (ref 11–59)

## 2014-03-19 MED ORDER — IBUPROFEN 800 MG PO TABS: 800.0000 mg | ORAL_TABLET | Freq: Three times a day (TID) | ORAL | Status: DC

## 2014-03-19 MED ORDER — KETOROLAC TROMETHAMINE 30 MG/ML IJ SOLN
30.0000 mg | Freq: Once | INTRAMUSCULAR | Status: AC
  Administered 2014-03-19: 30 mg via INTRAVENOUS
  Filled 2014-03-19: qty 1

## 2014-03-19 MED ORDER — ONDANSETRON HCL 4 MG PO TABS: 4.0000 mg | ORAL_TABLET | Freq: Four times a day (QID) | ORAL | Status: DC

## 2014-03-19 MED ORDER — SODIUM CHLORIDE 0.9 % IV BOLUS (SEPSIS)
1000.0000 mL | Freq: Once | INTRAVENOUS | Status: AC
  Administered 2014-03-19: 1000 mL via INTRAVENOUS

## 2014-03-19 MED ORDER — SODIUM CHLORIDE 0.9 % IV BOLUS (SEPSIS)
500.0000 mL | Freq: Once | INTRAVENOUS | Status: AC
  Administered 2014-03-19: 500 mL via INTRAVENOUS

## 2014-03-19 NOTE — ED Provider Notes (Signed)
CSN: [id number]     Arrival date & time 03/19/14  1202 History   First MD Initiated Contact with Patient 03/19/14 1508     Chief Complaint  Patient presents with  . Dizziness  . Depression  . Vaginal Bleeding  . multiple complaints    HPI  Patient's 24 year old female with past medical history of postpartum depression who presents to the emergency room for evaluation of multiple complaints. Patient states that for the last couple weeks she has been having intermittent dizziness. She states that when she stands up or when she is trying to walk around sometimes her vision goes dark around the edges and feels like she is going to pass out. She denies vertigo-like symptoms. She states she has had this in the past but feels that it is gotten worse recently. She denies all episodes. She does state that she has had poor food intake and liquid intake recently. She states that she has nausea when she tries intake food. She also complains of some abdominal pain. She states that it radiates from her lower abdomen up to her chest. She states that this is intermittent and has been worse over the last 2 weeks. Patient delivered her child vaginally. Patient also complaining of vaginal bleeding. She states that the blood is dark red. She states that he can be scant to moderate. She states that she recently just had a Depo-Provera shot [facility]. She states that she has always had irregular bleeding. But this is somehow different from prior. Patient also complaining of a knot on her right breast. She states that it's been there for a week. She states that it is getting bigger. She is not noticed any redness or drainage coming from this not. She is not currently breast-feeding. Patient has never had anything like this before. She has been using warm compresses on it. Patient also complaining of depression. She states that occasionally she cannot hold her temper. She sometimes has crying spells  over things that are not worth crying over. She states that she does not feel like harming her daughter or herself. She just feels that her temper is very short.  Past Medical History  Diagnosis Date  . Post partum depression     was prescribed meds, took 2, then stopped   Past Surgical History  Procedure Laterality Date  . Wisdom tooth extraction     Family History  Problem Relation Age of Onset  . Hypertension Father   . Diabetes Father   . Stroke Father    History  Substance Use Topics  . Smoking status: Light Tobacco Smoker -- 0.50 packs/day    Types: Cigarettes    Last Attempt to Quit: 06/05/2013  . Smokeless tobacco: Not on file  . Alcohol Use: No     Comment: occ- stopped with pregnancy   OB History    Gravida Para Term Preterm AB TAB SAB Ectopic Multiple Living   2 2 1       2      Review of Systems  Constitutional: Negative for fever, chills and fatigue.  Respiratory: Negative for chest tightness and shortness of breath.       Allergies  Latex  Home Medications   Prior to Admission medications   Medication Sig Start Date End Date Taking? Authorizing Provider  medroxyPROGESTERone (DEPO-SUBQ PROVERA 104) 104 MG/0.65ML injection Inject 104 mg into the skin every 3 (three) months.   Yes Historical Provider, MD  Multiple Vitamins-Minerals (  MULTIVITAMIN PO) Take 1 tablet by mouth every other day.   Yes Historical Provider, MD  Prenatal Vit-Fe Fumarate-FA (PRENATAL MULTIVITAMIN) TABS tablet Take 1 tablet by mouth every other day.   Yes Historical Provider, MD  ibuprofen (ADVIL,MOTRIN) 800 MG tablet Take 1 tablet (800 mg total) by mouth 3 (three) times daily. 03/19/14   Shanel Prazak A Forcucci, PA-C  naproxen (NAPROSYN) 375 MG tablet Take 1 tablet (375 mg total) by mouth 2 (two) times daily. Patient not taking: Reported on 03/13/2014 10/11/13   Rolan Bucco, MD  ondansetron (ZOFRAN) 4 MG tablet Take 1 tablet (4 mg total) by mouth every 6 (six) hours. 03/19/14   Monnica Saltsman A  Forcucci, PA-C  sertraline (ZOLOFT) 50 MG tablet Take 1 tablet (50 mg total) by mouth daily. Patient not taking: Reported on 03/13/2014 09/25/13   Catalina Antigua, MD   BP 96/67 mmHg  Pulse 65  Temp(Src) 98 F (36.7 C) (Oral)  Resp 20  SpO2 98%  LMP 09/21/2012 Physical Exam  Constitutional: She is oriented to person, place, and time. She appears well-developed and well-nourished. No distress.  HENT:  Head: Normocephalic and atraumatic.  Mouth/Throat: Oropharynx is clear and moist. No oropharyngeal exudate.  Eyes: Conjunctivae and EOM are normal. Pupils are equal, round, and reactive to light. No scleral icterus.  Neck: Normal range of motion. Neck supple. No JVD present. No thyromegaly present.  Cardiovascular: Normal rate, regular rhythm, normal heart sounds and intact distal pulses.  Exam reveals no gallop and no friction rub.   No murmur heard. Pulmonary/Chest: Effort normal and breath sounds normal. No respiratory distress. She has no wheezes. She has no rales. She exhibits no tenderness.  Abdominal: Soft. Bowel sounds are normal. She exhibits no distension and no mass. There is no tenderness. There is no rebound and no guarding.  Musculoskeletal: Normal range of motion.  Lymphadenopathy:    She has no cervical adenopathy.  Neurological: She is alert and oriented to person, place, and time. She has normal strength. No cranial nerve deficit or sensory deficit. Coordination normal.  Skin: Skin is warm and dry. She is not diaphoretic.  Small half centimeter papule on the right area on his flesh-colored. There is no palpable warmth or fluctuance. There is no erythema.  Psychiatric: She has a normal mood and affect. Her behavior is normal. Judgment and thought content normal.  Nursing note and vitals reviewed.   ED Course  Procedures (including critical care time) Labs Review Labs Reviewed  WET PREP, GENITAL - Abnormal; Notable for the following:    WBC, Wet Prep HPF POC MODERATE (*)     All other components within normal limits  CBC WITH DIFFERENTIAL/PLATELET - Abnormal; Notable for the following:    Hemoglobin 11.9 (*)    HCT 35.0 (*)    Neutrophils Relative % 40 (*)    Lymphocytes Relative 50 (*)    Eosinophils Relative 6 (*)    All other components within normal limits  COMPREHENSIVE METABOLIC PANEL - Abnormal; Notable for the following:    Alkaline Phosphatase 38 (*)    All other components within normal limits  URINE CULTURE  LIPASE, BLOOD  URINALYSIS, ROUTINE W REFLEX MICROSCOPIC  GC/CHLAMYDIA PROBE AMP ([place])    Imaging Review No results found.   EKG Interpretation None      MDM   Final diagnoses:  Post partum depression  Low back strain, initial encounter  Dizziness   Patient's 24 year old female who presents emergency room for evaluation of multiple  complaints. Diagnoses #1 postpartum depression. Feel the patient is a risk for to herself or to anyone else. I have urged her to follow-up with her PCP to speak with him further about restarting medication. Patient has had no further abdominal pain since she has been here. Labs are unremarkable. Urine is negative. Urine culture pending. GC pending. Wet prep shows moderate WBCs blood cells. We'll defer treatment to [facility] test. Suspect that patient has had poor by mouth intake which is causing her dizziness. I've urged her to eat frequently. I also feel that the patient likely needs an outpatient ultrasound progressed to further investigate the not. I have told her to come back if she experiences worsening redness, swelling, or pain. Patient also complaining of back pain. There are no red flags for cauda equina at this time. Suspect that this is muscle strain versus muscle spasm likely from picking up her child. Patient stable for discharge at this time. Patient given 1500 normal saline bolus here with improvement. She states that she feels considerably better. I reviewed to follow-up at the [facility]  outpatient clinic for further treatment of her breast mass. I will discharge her home with ibuprofen 800 mg 3 times daily. Patient return for symptoms of cauda equina, abscess, or any other concerning symptoms. She states understanding and agreement at this time. Patient stable for discharge.    Eben Burow, PA-C 03/19/14 2157  Ethelda Chick, MD 03/19/14 2200

## 2014-03-19 NOTE — ED Notes (Addendum)
Pt c/o dizziness-- when standing up fast, as well as sitting still-- "will feel sick and dizzy out of no where" states almost passes out-- blurry vision. States vaginal bleeding-- dark blood--scant to heavy. , no appetite-- for 2 weeks-- no diarrhea/nausea. Headache on left side of head. Also c/o back pain. Pt states has been diagnosed with post partum depression -- "baby bllues" -- states is under a lot of stress, overwhelmed, crying randomly. Was taking medicine, but stopped.

## 2014-03-19 NOTE — Discharge Instructions (Signed)
Back Pain, Adult Low back pain is very common. About 1 in 5 people have back pain.The cause of low back pain is rarely dangerous. The pain often gets better over time.About half of people with a sudden onset of back pain feel better in just 2 weeks. About 8 in 10 people feel better by 6 weeks.  CAUSES Some common causes of back pain include:  Strain of the muscles or ligaments supporting the spine.  Wear and tear (degeneration) of the spinal discs.  Arthritis.  Direct injury to the back. DIAGNOSIS Most of the time, the direct cause of low back pain is not known.However, back pain can be treated effectively even when the exact cause of the pain is unknown.Answering your caregiver's questions about your overall health and symptoms is one of the most accurate ways to make sure the cause of your pain is not dangerous. If your caregiver needs more information, he or she may order lab work or imaging tests (X-rays or MRIs).However, even if imaging tests show changes in your back, this usually does not require surgery. HOME CARE INSTRUCTIONS For many people, back pain returns.Since low back pain is rarely dangerous, it is often a condition that people can learn to [facility] their own.   Remain active. It is stressful on the back to sit or stand in one place. Do not sit, drive, or stand in one place for more than 30 minutes at a time. Take short walks on level surfaces as soon as pain allows.Try to increase the length of time you walk each day.  Do not stay in bed.Resting more than 1 or 2 days can delay your recovery.  Do not avoid exercise or work.Your body is made to move.It is not dangerous to be active, even though your back may hurt.Your back will likely heal faster if you return to being active before your pain is gone.  Pay attention to your body when you bend and lift. Many people have less discomfortwhen lifting if they bend their knees, keep the load close to their bodies,and  avoid twisting. Often, the most comfortable positions are those that put less stress on your recovering back.  Find a comfortable position to sleep. Use a firm mattress and lie on your side with your knees slightly bent. If you lie on your back, put a pillow under your knees.  Only take over-the-counter or prescription medicines as directed by your caregiver. Over-the-counter medicines to reduce pain and inflammation are often the most helpful.Your caregiver may prescribe muscle relaxant drugs.These medicines help dull your pain so you can more quickly return to your normal activities and healthy exercise.  Put ice on the injured area.  Put ice in a plastic bag.  Place a towel between your skin and the bag.  Leave the ice on for 15-20 minutes, 03-04 times a day for the first 2 to 3 days. After that, ice and heat may be alternated to reduce pain and spasms.  Ask your caregiver about trying back exercises and gentle massage. This may be of some benefit.  Avoid feeling anxious or stressed.Stress increases muscle tension and can worsen back pain.It is important to recognize when you are anxious or stressed and learn ways to manage it.Exercise is a great option. SEEK MEDICAL CARE IF:  You have pain that is not relieved with rest or medicine.  You have pain that does not improve in 1 week.  You have new symptoms.  You are generally not feeling well. SEEK  IMMEDIATE MEDICAL CARE IF:   You have pain that radiates from your back into your legs.  You develop new bowel or bladder control problems.  You have unusual weakness or numbness in your arms or legs.  You develop nausea or vomiting.  You develop abdominal pain.  You feel faint. Document Released: 02/07/2005 Document Revised: 08/09/2011 Document Reviewed: 06/11/2013 [facility] Patient Information 2015 [place], [place]. This information is not intended to replace advice given to you by your health care provider. Make sure you  discuss any questions you have with your health care provider.  Postpartum Depression and Baby Blues The postpartum period begins right after the birth of a baby. During this time, there is often a great amount of joy and excitement. It is also a time of many changes in the life of the parents. Regardless of how many times a mother gives birth, each child brings new challenges and dynamics to the family. It is not unusual to have feelings of excitement along with confusing shifts in moods, emotions, and thoughts. All mothers are at risk of developing postpartum depression or the "baby blues." These mood changes can occur right after giving birth, or they may occur many months after giving birth. The baby blues or postpartum depression can be mild or severe. Additionally, postpartum depression can go away rather quickly, or it can be a long-term condition.  CAUSES Raised hormone levels and the rapid drop in those levels are thought to be a main cause of postpartum depression and the baby blues. A number of hormones change during and after pregnancy. Estrogen and progesterone usually decrease right after the delivery of your baby. The levels of thyroid hormone and various cortisol steroids also rapidly drop. Other factors that play a role in these mood changes include major life events and genetics.  RISK FACTORS If you have any of the following risks for the baby blues or postpartum depression, know what symptoms to watch out for during the postpartum period. Risk factors that may increase the likelihood of getting the baby blues or postpartum depression include:  Having a personal or family history of depression.   Having depression while being pregnant.   Having premenstrual mood issues or mood issues related to oral contraceptives.  Having a lot of life stress.   Having marital conflict.   Lacking a social support network.   Having a baby with special needs.   Having health problems,  such as diabetes.  SIGNS AND SYMPTOMS Symptoms of baby blues include:  Brief changes in mood, such as going from extreme happiness to sadness.  Decreased concentration.   Difficulty sleeping.   Crying spells, tearfulness.   Irritability.   Anxiety.  Symptoms of postpartum depression typically begin within the first month after giving birth. These symptoms include:  Difficulty sleeping or excessive sleepiness.   Marked weight loss.   Agitation.   Feelings of worthlessness.   Lack of interest in activity or food.  Postpartum psychosis is a very serious condition and can be dangerous. Fortunately, it is rare. Displaying any of the following symptoms is cause for immediate medical attention. Symptoms of postpartum psychosis include:   Hallucinations and delusions.   Bizarre or disorganized behavior.   Confusion or disorientation.  DIAGNOSIS  A diagnosis is made by an evaluation of your symptoms. There are no medical or lab tests that lead to a diagnosis, but there are various questionnaires that a health care provider may use to identify those with the baby blues, postpartum  depression, or psychosis. Often, a screening tool called the [place] Postnatal Depression Scale is used to diagnose depression in the postpartum period.  TREATMENT The baby blues usually goes away on its own in 1-2 weeks. Social support is often all that is needed. You will be encouraged to get adequate sleep and rest. Occasionally, you may be given medicines to help you sleep.  Postpartum depression requires treatment because it can last several months or longer if it is not treated. Treatment may include individual or group therapy, medicine, or both to address any social, physiological, and psychological factors that may play a role in the depression. Regular exercise, a healthy diet, rest, and social support may also be strongly recommended.  Postpartum psychosis is more serious and needs  treatment right away. Hospitalization is often needed. HOME CARE INSTRUCTIONS  Get as much rest as you can. Nap when the baby sleeps.   Exercise regularly. Some women find yoga and walking to be beneficial.   Eat a balanced and nourishing diet.   Do little things that you enjoy. Have a cup of tea, take a bubble bath, read your favorite magazine, or listen to your favorite music.  Avoid alcohol.   Ask for help with household chores, cooking, grocery shopping, or running errands as needed. Do not try to do everything.   Talk to people close to you about how you are feeling. Get support from your partner, family members, friends, or other new moms.  Try to stay positive in how you think. Think about the things you are grateful for.   Do not spend a lot of time alone.   Only take over-the-counter or prescription medicine as directed by your health care provider.  Keep all your postpartum appointments.   Let your health care provider know if you have any concerns.  SEEK MEDICAL CARE IF: You are having a reaction to or problems with your medicine. SEEK IMMEDIATE MEDICAL CARE IF:  You have suicidal feelings.   You think you may harm the baby or someone else. MAKE SURE YOU:  Understand these instructions.  Will watch your condition.  Will get help right away if you are not doing well or get worse. Document Released: 11/12/2003 Document Revised: 02/12/2013 Document Reviewed: 11/19/2012 [facility] Patient Information 2015 [place], [place]. This information is not intended to replace advice given to you by your health care provider. Make sure you discuss any questions you have with your health care provider.

## 2014-03-20 LAB — URINE CULTURE
COLONY COUNT: NO GROWTH
Culture: NO GROWTH
SPECIAL REQUESTS: NORMAL

## 2014-03-20 LAB — GC/CHLAMYDIA PROBE AMP (~~LOC~~) NOT AT ARMC
Chlamydia: NEGATIVE
NEISSERIA GONORRHEA: NEGATIVE

## 2014-05-11 ENCOUNTER — Encounter (HOSPITAL_COMMUNITY): Payer: Self-pay

## 2014-05-11 ENCOUNTER — Inpatient Hospital Stay (HOSPITAL_COMMUNITY)
Admission: AD | Admit: 2014-05-11 | Discharge: 2014-05-11 | Disposition: A | Discharge status: Home / Self Care | Payer: Self-pay | Source: Ambulatory Visit | Attending: Obstetrics and Gynecology | Admitting: Obstetrics and Gynecology

## 2014-05-11 DIAGNOSIS — R1013 Epigastric pain: Secondary | ICD-10-CM | POA: Insufficient documentation

## 2014-05-11 DIAGNOSIS — F17210 Nicotine dependence, cigarettes, uncomplicated: Secondary | ICD-10-CM | POA: Insufficient documentation

## 2014-05-11 LAB — COMPREHENSIVE METABOLIC PANEL
ALT: 19 U/L (ref 0–35)
ANION GAP: 6 (ref 5–15)
AST: 16 U/L (ref 0–37)
Albumin: 4.3 g/dL (ref 3.5–5.2)
Alkaline Phosphatase: 38 U/L — ABNORMAL LOW (ref 39–117)
BUN: 13 mg/dL (ref 6–23)
CHLORIDE: 106 mmol/L (ref 96–112)
CO2: 26 mmol/L (ref 19–32)
Calcium: 9.1 mg/dL (ref 8.4–10.5)
Creatinine, Ser: 0.73 mg/dL (ref 0.50–1.10)
GFR calc non Af Amer: >90 mL/min (ref >90)
GLUCOSE: 87 mg/dL (ref 70–99)
Potassium: 3.9 mmol/L (ref 3.5–5.1)
Sodium: 138 mmol/L (ref 135–145)
Total Bilirubin: 0.7 mg/dL (ref 0.3–1.2)
Total Protein: 7 g/dL (ref 6.0–8.3)

## 2014-05-11 LAB — URINALYSIS, ROUTINE W REFLEX MICROSCOPIC
Bilirubin Urine: NEGATIVE
Glucose, UA: NEGATIVE mg/dL
Ketones, ur: NEGATIVE mg/dL
Leukocytes, UA: NEGATIVE
Nitrite: NEGATIVE
PH: 7.5 (ref 5.0–8.0)
Protein, ur: NEGATIVE mg/dL
Specific Gravity, Urine: 1.02 (ref 1.005–1.030)
UROBILINOGEN UA: 0.2 mg/dL (ref 0.0–1.0)

## 2014-05-11 LAB — CBC
HEMATOCRIT: 37.5 % (ref 36.0–46.0)
HEMOGLOBIN: 12.4 g/dL (ref 12.0–15.0)
MCH: 28.2 pg (ref 26.0–34.0)
MCHC: 33.1 g/dL (ref 30.0–36.0)
MCV: 85.2 fL (ref 78.0–100.0)
Platelets: 159 10*3/uL (ref 150–400)
RBC: 4.4 MIL/uL (ref 3.87–5.11)
RDW: 14.7 % (ref 11.5–15.5)
WBC: 5.6 10*3/uL (ref 4.0–10.5)

## 2014-05-11 LAB — URINE MICROSCOPIC-ADD ON

## 2014-05-11 LAB — AMYLASE: Amylase: 38 U/L (ref 0–105)

## 2014-05-11 LAB — LIPASE, BLOOD: Lipase: 30 U/L (ref 11–59)

## 2014-05-11 LAB — POCT PREGNANCY, URINE: Preg Test, Ur: NEGATIVE

## 2014-05-11 MED ORDER — RANITIDINE HCL 150 MG PO TABS: 150.0000 mg | ORAL_TABLET | Freq: Two times a day (BID) | ORAL | Status: DC

## 2014-05-11 MED ORDER — GI COCKTAIL ~~LOC~~
30.0000 mL | Freq: Once | ORAL | Status: AC
  Administered 2014-05-11: 30 mL via ORAL
  Filled 2014-05-11: qty 30

## 2014-05-11 NOTE — Discharge Instructions (Signed)
Abdominal Pain, Women °Abdominal (stomach, pelvic, or belly) pain can be caused by many things. It is important to tell your doctor: °· The location of the pain. °· Does it come and go or is it present all the time? °· Are there things that start the pain (eating certain foods, exercise)? °· Are there other symptoms associated with the pain (fever, nausea, vomiting, diarrhea)? °All of this is helpful to know when trying to find the cause of the pain. °CAUSES  °· Stomach: virus or bacteria infection, or ulcer. °· Intestine: appendicitis (inflamed appendix), regional ileitis (Crohn's disease), ulcerative colitis (inflamed colon), irritable bowel syndrome, diverticulitis (inflamed diverticulum of the colon), or cancer of the stomach or intestine. °· Gallbladder disease or stones in the gallbladder. °· Kidney disease, kidney stones, or infection. °· Pancreas infection or cancer. °· Fibromyalgia (pain disorder). °· Diseases of the female organs: °¨ Uterus: fibroid (non-cancerous) tumors or infection. °¨ Fallopian tubes: infection or tubal pregnancy. °¨ Ovary: cysts or tumors. °¨ Pelvic adhesions (scar tissue). °¨ Endometriosis (uterus lining tissue growing in the pelvis and on the pelvic organs). °¨ Pelvic congestion syndrome (female organs filling up with blood just before the menstrual period). °¨ Pain with the menstrual period. °¨ Pain with ovulation (producing an egg). °¨ Pain with an IUD (intrauterine device, birth control) in the uterus. °¨ Cancer of the female organs. °· Functional pain (pain not caused by a disease, may improve without treatment). °· Psychological pain. °· Depression. °DIAGNOSIS  °Your doctor will decide the seriousness of your pain by doing an examination. °· Blood tests. °· X-rays. °· Ultrasound. °· CT scan (computed tomography, special type of X-ray). °· MRI (magnetic resonance imaging). °· Cultures, for infection. °· Barium enema (dye inserted in the large intestine, to better view it with  X-rays). °· Colonoscopy (looking in intestine with a lighted tube). °· Laparoscopy (minor surgery, looking in abdomen with a lighted tube). °· Major abdominal exploratory surgery (looking in abdomen with a large incision). °TREATMENT  °The treatment will depend on the cause of the pain.  °· Many cases can be observed and treated at home. °· Over-the-counter medicines recommended by your caregiver. °· Prescription medicine. °· Antibiotics, for infection. °· Birth control pills, for painful periods or for ovulation pain. °· Hormone treatment, for endometriosis. °· Nerve blocking injections. °· Physical therapy. °· Antidepressants. °· Counseling with a psychologist or psychiatrist. °· Minor or major surgery. °HOME CARE INSTRUCTIONS  °· Do not take laxatives, unless directed by your caregiver. °· Take over-the-counter pain medicine only if ordered by your caregiver. Do not take aspirin because it can cause an upset stomach or bleeding. °· Try a clear liquid diet (broth or water) as ordered by your caregiver. Slowly move to a bland diet, as tolerated, if the pain is related to the stomach or intestine. °· Have a thermometer and take your temperature several times a day, and record it. °· Bed rest and sleep, if it helps the pain. °· Avoid sexual intercourse, if it causes pain. °· Avoid stressful situations. °· Keep your follow-up appointments and tests, as your caregiver orders. °· If the pain does not go away with medicine or surgery, you may try: °¨ Acupuncture. °¨ Relaxation exercises (yoga, meditation). °¨ Group therapy. °¨ Counseling. °SEEK MEDICAL CARE IF:  °· You notice certain foods cause stomach pain. °· Your home care treatment is not helping your pain. °· You need stronger pain medicine. °· You want your IUD removed. °· You feel faint or   lightheaded. °· You develop nausea and vomiting. °· You develop a rash. °· You are having side effects or an allergy to your medicine. °SEEK IMMEDIATE MEDICAL CARE IF:  °· Your  pain does not go away or gets worse. °· You have a fever. °· Your pain is felt only in portions of the abdomen. The right side could possibly be appendicitis. The left lower portion of the abdomen could be colitis or diverticulitis. °· You are passing blood in your stools (bright red or black tarry stools, with or without vomiting). °· You have blood in your urine. °· You develop chills, with or without a fever. °· You pass out. °MAKE SURE YOU:  °· Understand these instructions. °· Will watch your condition. °· Will get help right away if you are not doing well or get worse. °Document Released: 12/05/2006 Document Revised: 06/24/2013 Document Reviewed: 12/25/2008 °Exit[employer]® Patient Information ©2015 [employer], [employer]. This information is not intended to replace advice given to you by your health care provider. Make sure you discuss any questions you have with your health care provider. ° °

## 2014-05-11 NOTE — MAU Note (Signed)
Pt presents to MAU with complaints of lower abdominal pain with nausea for a couple of days.

## 2014-05-11 NOTE — MAU Provider Note (Signed)
History     CSN: [id number]  Arrival date and time: 05/11/14 1116   First Provider Initiated Contact with Patient 05/11/14 1150      Chief Complaint  Patient presents with  . Abdominal Pain  . Nausea   HPI 24 y.o. [id number] with upper abdominal pain x 3 days. Pt describes pain as tightening in epigastric area and bilateral upper quadrants/under ribs. Worse with activity, no change with eating. + Nausea, no vomiting. Pt states she had a fever yesterday, none today.   Past Medical History  Diagnosis Date  . Post partum depression     was prescribed meds, took 2, then stopped    Past Surgical History  Procedure Laterality Date  . Wisdom tooth extraction      Family History  Problem Relation Age of Onset  . Hypertension Father   . Diabetes Father   . Stroke Father     History  Substance Use Topics  . Smoking status: Current Every Day Smoker -- 0.50 packs/day    Types: Cigarettes    Last Attempt to Quit: 06/05/2013  . Smokeless tobacco: Not on file  . Alcohol Use: No     Comment: occ- stopped with pregnancy    Allergies:  Allergies  Allergen Reactions  . Latex Itching and Swelling    No prescriptions prior to admission    Review of Systems  Constitutional: Positive for fever and chills.  Respiratory: Negative.   Cardiovascular: Negative.   Gastrointestinal: Positive for nausea and abdominal pain. Negative for vomiting, diarrhea and constipation.  Genitourinary: Negative for dysuria, urgency, frequency, hematuria and flank pain.       Negative for vaginal bleeding, vaginal discharge, dyspareunia  Musculoskeletal: Negative.   Neurological: Negative.   Psychiatric/Behavioral: Negative.    Physical Exam   Blood pressure 102/66, pulse 98, temperature 97.9 F (36.6 C), resp. rate 18, height  (1.6 m), weight 97 lb (43.999 kg), last menstrual period 05/11/2014, not currently breastfeeding.  Physical Exam  Nursing note and vitals reviewed. Constitutional:  She is oriented to person, place, and time. She appears well-developed. No distress.  Very thin  Cardiovascular: Normal rate.   Respiratory: Effort normal.  GI: Soft. She exhibits no distension and no mass. There is no tenderness. There is no rebound and no guarding.  Musculoskeletal: Normal range of motion.  Neurological: She is alert and oriented to person, place, and time.  Skin: Skin is warm and dry.  Psychiatric: She has a normal mood and affect.    MAU Course  Procedures  Results for orders placed or performed during the hospital encounter of 05/11/14 (from the past 24 hour(s))  Urinalysis, Routine w reflex microscopic     Status: Abnormal   Collection Time: 05/11/14 11:25 AM  Result Value Ref Range   Color, Urine YELLOW YELLOW   APPearance CLEAR CLEAR   Specific Gravity, Urine 1.020 1.005 - 1.030   pH 7.5 5.0 - 8.0   Glucose, UA NEGATIVE NEGATIVE mg/dL   Hgb urine dipstick LARGE (A) NEGATIVE   Bilirubin Urine NEGATIVE NEGATIVE   Ketones, ur NEGATIVE NEGATIVE mg/dL   Protein, ur NEGATIVE NEGATIVE mg/dL   Urobilinogen, UA 0.2 0.0 - 1.0 mg/dL   Nitrite NEGATIVE NEGATIVE   Leukocytes, UA NEGATIVE NEGATIVE  Urine microscopic-add on     Status: Abnormal   Collection Time: 05/11/14 11:25 AM  Result Value Ref Range   Squamous Epithelial / LPF FEW (A) RARE   WBC, UA 3-6 <3 WBC/hpf  RBC / HPF 0-2 <3 RBC/hpf   Bacteria, UA FEW (A) RARE  Pregnancy, urine POC     Status: None   Collection Time: 05/11/14 11:43 AM  Result Value Ref Range   Preg Test, Ur NEGATIVE NEGATIVE  CBC     Status: None   Collection Time: 05/11/14 11:59 AM  Result Value Ref Range   WBC 5.6 4.0 - 10.5 K/uL   RBC 4.40 3.87 - 5.11 MIL/uL   Hemoglobin 12.4 12.0 - 15.0 g/dL   HCT 16.137.5 09.636.0 - 04.546.0 %   MCV 85.2 78.0 - 100.0 fL   MCH 28.2 26.0 - 34.0 pg   MCHC 33.1 30.0 - 36.0 g/dL   RDW 40.914.7 81.111.5 - 91.415.5 %   Platelets 159 150 - 400 K/uL  Comprehensive metabolic panel     Status: Abnormal   Collection  Time: 05/11/14 11:59 AM  Result Value Ref Range   Sodium 138 135 - 145 mmol/L   Potassium 3.9 3.5 - 5.1 mmol/L   Chloride 106 96 - 112 mmol/L   CO2 26 19 - 32 mmol/L   Glucose, Bld 87 70 - 99 mg/dL   BUN 13 6 - 23 mg/dL   Creatinine, Ser 7.820.73 0.50 - 1.10 mg/dL   Calcium 9.1 8.4 - 95.610.5 mg/dL   Total Protein 7.0 6.0 - 8.3 g/dL   Albumin 4.3 3.5 - 5.2 g/dL   AST 16 0 - 37 U/L   ALT 19 0 - 35 U/L   Alkaline Phosphatase 38 (L) 39 - 117 U/L   Total Bilirubin 0.7 0.3 - 1.2 mg/dL   GFR calc non Af Amer >90 >90 mL/min   GFR calc Af Amer >90 >90 mL/min   Anion gap 6 5 - 15   Pain improved w/ GI cocktail  Assessment and Plan   1. Abdominal pain, epigastric   Exam, CBC and CMP WNL today Pain improved w/ GI cocktail, pt comfortable and tolerating food and drink in MAU Rx ranitidine, f/u w/ PCP for ongoing eval if symptoms continue     Medication List    STOP taking these medications        ibuprofen 800 MG tablet  Commonly known as:  ADVIL,MOTRIN     naproxen 375 MG tablet  Commonly known as:  NAPROSYN     ondansetron 4 MG tablet  Commonly known as:  ZOFRAN     sertraline 50 MG tablet  Commonly known as:  ZOLOFT      TAKE these medications        medroxyPROGESTERone 104 MG/0.65ML injection  Commonly known as:  DEPO-SUBQ PROVERA 104  Inject 104 mg into the skin every 3 (three) months.     multivitamin with minerals Tabs tablet  Take 1 tablet by mouth daily.     prenatal multivitamin Tabs tablet  Take 1 tablet by mouth every other day.     ranitidine 150 MG tablet  Commonly known as:  ZANTAC  Take 1 tablet (150 mg total) by mouth 2 (two) times daily.            Follow-up Information    Follow up with your primary care doctor.   Why:  As needed        FRAZIER,NATALIE 05/11/2014, 2:34 PM

## 2014-06-04 ENCOUNTER — Ambulatory Visit: Payer: Medicaid Other

## 2015-01-10 ENCOUNTER — Emergency Department (HOSPITAL_COMMUNITY)
Admission: EM | Admit: 2015-01-10 | Discharge: 2015-01-10 | Disposition: A | Discharge status: Home / Self Care | Payer: Medicaid Other | Attending: Emergency Medicine | Admitting: Emergency Medicine

## 2015-01-10 ENCOUNTER — Encounter (HOSPITAL_COMMUNITY): Payer: Self-pay

## 2015-01-10 ENCOUNTER — Emergency Department (HOSPITAL_COMMUNITY): Payer: Medicaid Other

## 2015-01-10 DIAGNOSIS — R1084 Generalized abdominal pain: Secondary | ICD-10-CM | POA: Insufficient documentation

## 2015-01-10 DIAGNOSIS — Z8659 Personal history of other mental and behavioral disorders: Secondary | ICD-10-CM | POA: Insufficient documentation

## 2015-01-10 DIAGNOSIS — J029 Acute pharyngitis, unspecified: Secondary | ICD-10-CM | POA: Insufficient documentation

## 2015-01-10 DIAGNOSIS — F17210 Nicotine dependence, cigarettes, uncomplicated: Secondary | ICD-10-CM | POA: Insufficient documentation

## 2015-01-10 DIAGNOSIS — R05 Cough: Secondary | ICD-10-CM | POA: Insufficient documentation

## 2015-01-10 DIAGNOSIS — R112 Nausea with vomiting, unspecified: Secondary | ICD-10-CM

## 2015-01-10 DIAGNOSIS — Z331 Pregnant state, incidental: Secondary | ICD-10-CM

## 2015-01-10 DIAGNOSIS — R5381 Other malaise: Secondary | ICD-10-CM | POA: Insufficient documentation

## 2015-01-10 DIAGNOSIS — M791 Myalgia: Secondary | ICD-10-CM | POA: Insufficient documentation

## 2015-01-10 DIAGNOSIS — R197 Diarrhea, unspecified: Secondary | ICD-10-CM | POA: Insufficient documentation

## 2015-01-10 DIAGNOSIS — Z91040 Latex allergy status: Secondary | ICD-10-CM | POA: Insufficient documentation

## 2015-01-10 DIAGNOSIS — Z3490 Encounter for supervision of normal pregnancy, unspecified, unspecified trimester: Secondary | ICD-10-CM

## 2015-01-10 LAB — BASIC METABOLIC PANEL
Anion gap: 8 (ref 5–15)
BUN: 7 mg/dL (ref 6–20)
CO2: 23 mmol/L (ref 22–32)
Calcium: 8.9 mg/dL (ref 8.9–10.3)
Chloride: 105 mmol/L (ref 101–111)
Creatinine, Ser: 0.63 mg/dL (ref 0.44–1.00)
GFR calc Af Amer: >60 mL/min (ref >60)
Glucose, Bld: 107 mg/dL — ABNORMAL HIGH (ref 65–99)
POTASSIUM: 3.5 mmol/L (ref 3.5–5.1)
SODIUM: 136 mmol/L (ref 135–145)

## 2015-01-10 LAB — CBC WITH DIFFERENTIAL/PLATELET
BASOS ABS: 0 10*3/uL (ref 0.0–0.1)
Basophils Relative: 0 %
EOS ABS: 0.1 10*3/uL (ref 0.0–0.7)
EOS PCT: 0 %
HCT: 33.9 % — ABNORMAL LOW (ref 36.0–46.0)
Hemoglobin: 11.2 g/dL — ABNORMAL LOW (ref 12.0–15.0)
Lymphocytes Relative: 8 %
Lymphs Abs: 1.3 10*3/uL (ref 0.7–4.0)
MCH: 27.9 pg (ref 26.0–34.0)
MCHC: 33 g/dL (ref 30.0–36.0)
MCV: 84.3 fL (ref 78.0–100.0)
Monocytes Absolute: 1 10*3/uL (ref 0.1–1.0)
Monocytes Relative: 6 %
Neutro Abs: 13.7 10*3/uL — ABNORMAL HIGH (ref 1.7–7.7)
Neutrophils Relative %: 86 %
Platelets: 186 10*3/uL (ref 150–400)
RBC: 4.02 MIL/uL (ref 3.87–5.11)
RDW: 14.9 % (ref 11.5–15.5)
WBC: 16.1 10*3/uL — AB (ref 4.0–10.5)

## 2015-01-10 LAB — I-STAT BETA HCG BLOOD, ED (MC, WL, AP ONLY): I-stat hCG, quantitative: >2000 m[IU]/mL — ABNORMAL HIGH (ref <5)

## 2015-01-10 MED ORDER — SODIUM CHLORIDE 0.9 % IV BOLUS (SEPSIS)
1000.0000 mL | Freq: Once | INTRAVENOUS | Status: AC
  Administered 2015-01-10: 1000 mL via INTRAVENOUS

## 2015-01-10 MED ORDER — ONDANSETRON HCL 4 MG/2ML IJ SOLN
4.0000 mg | Freq: Once | INTRAMUSCULAR | Status: AC
  Administered 2015-01-10: 4 mg via INTRAVENOUS
  Filled 2015-01-10: qty 2

## 2015-01-10 MED ORDER — ONDANSETRON 4 MG PO TBDP: ORAL_TABLET | ORAL | Status: DC

## 2015-01-10 NOTE — ED Provider Notes (Signed)
CSN: [id number]     Arrival date & time 01/10/15  0702 History   First MD Initiated Contact with Patient 01/10/15 [phone]     Chief Complaint  Patient presents with  . Sore Throat  . Cough  . Possible Pregnancy  . Abdominal Pain     (Consider location/radiation/quality/duration/timing/severity/associated sxs/prior Treatment) Patient is a 24 y.o. female presenting with general illness.  Illness Location:  Generalized Quality:  Malaise, myalgias Severity:  Moderate Onset quality:  Gradual Duration:  1 day Timing:  Constant Progression:  Unchanged Chronicity:  New Context:  +sick contact with same, no flu shot this year, + UPT 2 weeks ago, planned abortion Relieved by:  Nothing Worsened by:  Nothing Associated symptoms: diarrhea, fever (subjective), nausea and vomiting   Associated symptoms: no abdominal pain and no chest pain     Past Medical History  Diagnosis Date  . Post partum depression     was prescribed meds, took 2, then stopped   Past Surgical History  Procedure Laterality Date  . Wisdom tooth extraction     Family History  Problem Relation Age of Onset  . Hypertension Father   . Diabetes Father   . Stroke Father    Social History  Substance Use Topics  . Smoking status: Current Every Day Smoker -- 0.50 packs/day    Types: Cigarettes    Last Attempt to Quit: 06/05/2013  . Smokeless tobacco: Never Used  . Alcohol Use: No     Comment: occ- stopped with pregnancy   OB History    Gravida Para Term Preterm AB TAB SAB Ectopic Multiple Living   Review of Systems  Constitutional: Positive for fever (subjective).  Cardiovascular: Negative for chest pain.  Gastrointestinal: Positive for nausea, vomiting and diarrhea. Negative for abdominal pain.  All other systems reviewed and are negative.     Allergies  Latex  Home Medications   Prior to Admission medications   Medication Sig Start Date End Date Taking? Authorizing Provider   ondansetron (ZOFRAN ODT) 4 MG disintegrating tablet  ODT q4 hours prn nausea/vomit 01/10/15   Mirian Mo, MD   BP 95/51 mmHg  Pulse 86  Temp(Src) 100.1 F (37.8 C) (Oral)  Resp 18  Ht  (1.6 m)  Wt 100 lb (45.36 kg)  BMI 17.72 kg/m2  SpO2 100%  LMP 12/20/2014  Breastfeeding? Unknown Physical Exam  Constitutional: She is oriented to person, place, and time. She appears well-developed and well-nourished.  HENT:  Head: Normocephalic and atraumatic.  Right Ear: External ear normal.  Left Ear: External ear normal.  Eyes: Conjunctivae and EOM are normal. Pupils are equal, round, and reactive to light.  Neck: Normal range of motion. Neck supple.  Cardiovascular: Normal rate, regular rhythm, normal heart sounds and intact distal pulses.   Pulmonary/Chest: Effort normal and breath sounds normal.  Abdominal: Soft. Bowel sounds are normal. There is generalized tenderness (mild).  Musculoskeletal: Normal range of motion.  Neurological: She is alert and oriented to person, place, and time.  Skin: Skin is warm and dry.  Vitals reviewed.   ED Course  Procedures (including critical care time) Labs Review Labs Reviewed  CBC WITH DIFFERENTIAL/PLATELET - Abnormal; Notable for the following:    WBC 16.1 (*)    Hemoglobin 11.2 (*)    HCT 33.9 (*)    Neutro Abs 13.7 (*)    All other components within normal limits  BASIC  METABOLIC PANEL - Abnormal; Notable for the following:    Glucose, Bld 107 (*)    All other components within normal limits  I-STAT BETA HCG BLOOD, ED ([facility], [facility], [facility] ONLY) - Abnormal; Notable for the following:    I-stat hCG, quantitative >2000.0 (*)    All other components within normal limits    Imaging Review Dg Chest 2 View  01/10/2015  CLINICAL DATA:  Chest and abdomen pain.  Cough. EXAM: CHEST  2 VIEW COMPARISON:  None. FINDINGS: The heart size and mediastinal contours are within normal limits. Both lungs are clear. The visualized skeletal structures are  unremarkable. IMPRESSION: Normal chest. Electronically Signed   By: Francene BoyersJames  Maxwell M.D.   On: 01/10/2015 08:36   I have personally reviewed and evaluated these images and lab results as part of my medical decision-making.   EKG Interpretation None       EMERGENCY DEPARTMENT US PREGNANCY "Study: Limited Ultrasound of the Pelvis for Pregnancy"  INDICATIONS:Pregnancy(required) and Pelvic pain Multiple views of the uterus and pelvic cavity were obtained in real-time with a multi-frequency probe.  APPROACH:Transabdominal   PERFORMED BY: Myself  IMAGES ARCHIVED?: Yes  LIMITATIONS:   PREGNANCY FREE FLUID: None  ADNEXAL FINDINGS:Right ovarion cyst  PREGNANCY FINDINGS: Yolk sac noted, Fetal pole present and Fetal heart activity seen  INTERPRETATION: Viable intrauterine pregnancy and Pelvic free fluid absent  GESTATIONAL AGE, ESTIMATE: 6 weeks  FETAL HEART RATE: flicker  CPT Codes:  62130-8676815-26 (transabdominal OB)  768-26-52 (transvaginal OB, Reduced level of service for incomplete exam)      MDM   Final diagnoses:  Non-intractable vomiting with nausea, vomiting of unspecified type  IUP (intrauterine pregnancy), incidental    24 y.o. female without pertinent PMH presents with malaise, myalgias, n/v/d as above.  +Sick contact.  Exam benign.  US with IUP.  Dorna BloomWu otherwise benign.  Pt has scheduled abortion. Symptoms relieved with NS bolus and zofran.  DC home in stable condition.  No vaginal bleeding or dc.  I have reviewed all laboratory and imaging studies if ordered as above  1. Non-intractable vomiting with nausea, vomiting of unspecified type   2. IUP (intrauterine pregnancy), incidental         Mirian MoMatthew Chas Axel, MD 01/10/15 [phone]

## 2015-01-10 NOTE — Discharge Instructions (Signed)
First Trimester of Pregnancy  The first trimester of pregnancy is from week 1 until the end of week 12 (months 1 through 3). A week after a sperm fertilizes an egg, the egg will implant on the wall of the uterus. This embryo will begin to develop into a baby. Genes from you and your partner are forming the baby. The female genes determine whether the baby is a boy or a girl. At 6-8 weeks, the eyes and face are formed, and the heartbeat can be seen on ultrasound. At the end of 12 weeks, all the baby's organs are formed.   Now that you are pregnant, you will want to do everything you can to have a healthy baby. Two of the most important things are to get good prenatal care and to follow your health care provider's instructions. Prenatal care is all the medical care you receive before the baby's birth. This care will help prevent, find, and treat any problems during the pregnancy and childbirth.  BODY CHANGES  Your body goes through many changes during pregnancy. The changes vary from woman to woman.   · You may gain or lose a couple of pounds at first.  · You may feel sick to your stomach (nauseous) and throw up (vomit). If the vomiting is uncontrollable, call your health care provider.  · You may tire easily.  · You may develop headaches that can be relieved by medicines approved by your health care provider.  · You may urinate more often. Painful urination may mean you have a bladder infection.  · You may develop heartburn as a result of your pregnancy.  · You may develop constipation because certain hormones are causing the muscles that push waste through your intestines to slow down.  · You may develop hemorrhoids or swollen, bulging veins (varicose veins).  · Your breasts may begin to grow larger and become tender. Your nipples may stick out more, and the tissue that surrounds them (areola) may become darker.  · Your gums may bleed and may be sensitive to brushing and flossing.   · Dark spots or blotches (chloasma, mask of pregnancy) may develop on your face. This will likely fade after the baby is born.  · Your menstrual periods will stop.  · You may have a loss of appetite.  · You may develop cravings for certain kinds of food.  · You may have changes in your emotions from day to day, such as being excited to be pregnant or being concerned that something may go wrong with the pregnancy and baby.  · You may have more vivid and strange dreams.  · You may have changes in your hair. These can include thickening of your hair, rapid growth, and changes in texture. Some women also have hair loss during or after pregnancy, or hair that feels dry or thin. Your hair will most likely return to normal after your baby is born.  WHAT TO EXPECT AT YOUR PRENATAL VISITS  During a routine prenatal visit:  · You will be weighed to make sure you and the baby are growing normally.  · Your blood pressure will be taken.  · Your abdomen will be measured to track your baby's growth.  · The fetal heartbeat will be listened to starting around week 10 or 12 of your pregnancy.  · Test results from any previous visits will be discussed.  Your health care provider may ask you:  · How you are feeling.  · If you   including cigarettes, chewing tobacco, and electronic cigarettes. °· If you have any questions. °Other tests that may be performed during your first trimester include: °· Blood tests to find your blood type and to check for the presence of any previous infections. They will also be used to check for low iron levels (anemia) and Rh antibodies. Later in the pregnancy, blood tests for diabetes will be done along with other tests if problems develop. °· Urine tests to check for infections, diabetes, or protein in the urine. °· An ultrasound to confirm the proper growth  and development of the baby. °· An amniocentesis to check for possible genetic problems. °· Fetal screens for spina bifida and Down syndrome. °· You may need other tests to make sure you and the baby are doing well. °· HIV (human immunodeficiency virus) testing. Routine prenatal testing includes screening for HIV, unless you choose not to have this test. °HOME CARE INSTRUCTIONS  °Medicines °· Follow your health care provider's instructions regarding medicine use. Specific medicines may be either safe or unsafe to take during pregnancy. °· Take your prenatal vitamins as directed. °· If you develop constipation, try taking a stool softener if your health care provider approves. °Diet °· Eat regular, well-balanced meals. Choose a variety of foods, such as meat or vegetable-based protein, fish, milk and low-fat dairy products, vegetables, fruits, and whole grain breads and cereals. Your health care provider will help you determine the amount of weight gain that is right for you. °· Avoid raw meat and uncooked cheese. These carry germs that can cause birth defects in the baby. °· Eating four or five small meals rather than three large meals a day may help relieve nausea and vomiting. If you start to feel nauseous, eating a few soda crackers can be helpful. Drinking liquids between meals instead of during meals also seems to help nausea and vomiting. °· If you develop constipation, eat more high-fiber foods, such as fresh vegetables or fruit and whole grains. Drink enough fluids to keep your urine clear or pale yellow. °Activity and Exercise °· Exercise only as directed by your health care provider. Exercising will help you: °¨ Control your weight. °¨ Stay in shape. °¨ Be prepared for labor and delivery. °· Experiencing pain or cramping in the lower abdomen or low back is a good sign that you should stop exercising. Check with your health care provider before continuing normal exercises. °· Try to avoid standing for long  periods of time. Move your legs often if you must stand in one place for a long time. °· Avoid heavy lifting. °· Wear low-heeled shoes, and practice good posture. °· You may continue to have sex unless your health care provider directs you otherwise. °Relief of Pain or Discomfort °· Wear a good support bra for breast tenderness.   °· Take warm sitz baths to soothe any pain or discomfort caused by hemorrhoids. Use hemorrhoid cream if your health care provider approves.   °· Rest with your legs elevated if you have leg cramps or low back pain. °· If you develop varicose veins in your legs, wear support hose. Elevate your feet for 15 minutes, 3-4 times a day. Limit salt in your diet. °Prenatal Care °· Schedule your prenatal visits by the twelfth week of pregnancy. They are usually scheduled monthly at first, then more often in the last 2 months before delivery. °· Write down your questions. Take them to your prenatal visits. °· Keep all your prenatal visits as directed by your   health care provider. Safety  Wear your seat belt at all times when driving.  Make a list of emergency phone numbers, including numbers for family, friends, the hospital, and police and fire departments. General Tips  Ask your health care provider for a referral to a local prenatal education class. Begin classes no later than at the beginning of month 6 of your pregnancy.  Ask for help if you have counseling or nutritional needs during pregnancy. Your health care provider can offer advice or refer you to specialists for help with various needs.  Do not use hot tubs, steam rooms, or saunas.  Do not douche or use tampons or scented sanitary pads.  Do not cross your legs for long periods of time.  Avoid cat litter boxes and soil used by cats. These carry germs that can cause birth defects in the baby and possibly loss of the fetus by miscarriage or stillbirth.  Avoid all smoking, herbs, alcohol, and medicines not prescribed by  your health care provider. Chemicals in these affect the formation and growth of the baby.  Do not use any tobacco products, including cigarettes, chewing tobacco, and electronic cigarettes. If you need help quitting, ask your health care provider. You may receive counseling support and other resources to help you quit.  Schedule a dentist appointment. At home, brush your teeth with a soft toothbrush and be gentle when you floss. SEEK MEDICAL CARE IF:   You have dizziness.  You have mild pelvic cramps, pelvic pressure, or nagging pain in the abdominal area.  You have persistent nausea, vomiting, or diarrhea.  You have a bad smelling vaginal discharge.  You have pain with urination.  You notice increased swelling in your face, hands, legs, or ankles. SEEK IMMEDIATE MEDICAL CARE IF:   You have a fever.  You are leaking fluid from your vagina.  You have spotting or bleeding from your vagina.  You have severe abdominal cramping or pain.  You have rapid weight gain or loss.  You vomit blood or material that looks like coffee grounds.  You are exposed to [place] measles and have never had them.  You are exposed to fifth disease or chickenpox.  You develop a severe headache.  You have shortness of breath.  You have any kind of trauma, such as from a fall or a car accident.   This information is not intended to replace advice given to you by your health care provider. Make sure you discuss any questions you have with your health care provider.   Document Released: 02/01/2001 Document Revised: 02/28/2014 Document Reviewed: 12/18/2012 Elsevier Interactive Patient Education 2016 [employer] Viral Gastroenteritis Viral gastroenteritis is also known as stomach flu. This condition affects the stomach and intestinal tract. It can cause sudden diarrhea and vomiting. The illness typically lasts 3 to 8 days. Most people develop an immune response that eventually gets rid of the virus.  While this natural response develops, the virus can make you quite ill. CAUSES  Many different viruses can cause gastroenteritis, such as rotavirus or noroviruses. You can catch one of these viruses by consuming contaminated food or water. You may also catch a virus by sharing utensils or other personal items with an infected person or by touching a contaminated surface. SYMPTOMS  The most common symptoms are diarrhea and vomiting. These problems can cause a severe loss of body fluids (dehydration) and a body salt (electrolyte) imbalance. Other symptoms may include:  Fever.  Headache.  Fatigue.  Abdominal pain.  DIAGNOSIS  Your caregiver can usually diagnose viral gastroenteritis based on your symptoms and a physical exam. A stool sample may also be taken to test for the presence of viruses or other infections. TREATMENT  This illness typically goes away on its own. Treatments are aimed at rehydration. The most serious cases of viral gastroenteritis involve vomiting so severely that you are not able to keep fluids down. In these cases, fluids must be given through an intravenous line (IV). HOME CARE INSTRUCTIONS   Drink enough fluids to keep your urine clear or pale yellow. Drink small amounts of fluids frequently and increase the amounts as tolerated.  Ask your caregiver for specific rehydration instructions.  Avoid:  Foods high in sugar.  Alcohol.  Carbonated drinks.  Tobacco.  Juice.  Caffeine drinks.  Extremely hot or cold fluids.  Fatty, greasy foods.  Too much intake of anything at one time.  Dairy products until 24 to 48 hours after diarrhea stops.  You may consume probiotics. Probiotics are active cultures of beneficial bacteria. They may lessen the amount and number of diarrheal stools in adults. Probiotics can be found in yogurt with active cultures and in supplements.  Wash your hands well to avoid spreading the virus.  Only take over-the-counter or  prescription medicines for pain, discomfort, or fever as directed by your caregiver. Do not give aspirin to children. Antidiarrheal medicines are not recommended.  Ask your caregiver if you should continue to take your regular prescribed and over-the-counter medicines.  Keep all follow-up appointments as directed by your caregiver. SEEK IMMEDIATE MEDICAL CARE IF:   You are unable to keep fluids down.  You do not urinate at least once every 6 to 8 hours.  You develop shortness of breath.  You notice blood in your stool or vomit. This may look like coffee grounds.  You have abdominal pain that increases or is concentrated in one small area (localized).  You have persistent vomiting or diarrhea.  You have a fever.  The patient is a child younger than 3 months, and he or she has a fever.  The patient is a child older than 3 months, and he or she has a fever and persistent symptoms.  The patient is a child older than 3 months, and he or she has a fever and symptoms suddenly get worse.  The patient is a baby, and he or she has no tears when crying. MAKE SURE YOU:   Understand these instructions.  Will watch your condition.  Will get help right away if you are not doing well or get worse.   This information is not intended to replace advice given to you by your health care provider. Make sure you discuss any questions you have with your health care provider.   Document Released: 02/07/2005 Document Revised: 05/02/2011 Document Reviewed: 11/24/2010 Elsevier Interactive Patient Education [employer]

## 2015-01-10 NOTE — ED Notes (Addendum)
Patient c/o sore throat, productive cough with yellow sputum, nausea since yesterday. Patient also c/o lower abdominal pain. Patient states she had  A positive home pregnancy test a 1-2 weeks ago. Patient also states that she did marijuana and cocaine 2 days and states that she has an appointment to abort the baby.

## 2015-01-10 NOTE — ED Notes (Signed)
Bed: [facility] Expected date:  Expected time:  Means of arrival:  Comments: 

## 2015-02-22 NOTE — L&D Delivery Note (Signed)
Delivery Note  At 1:15 AM a viable female was delivered via Vaginal, Spontaneous Delivery (Presentation: Left Occiput Anterior).  APGAR: 8, 9; weight pending .   Placenta status: Intact, Spontaneous.  Cord: 3 vessels with the following complications: None.  Cord pH: not obtained  Anesthesia: Epidural  Episiotomy: None Lacerations: none Suture Repair: n/a Est. Blood Loss (mL): 20  Mom to postpartum.  Baby to Couplet care / Skin to Skin.  Lisa Holt 09/02/2015, 2:22 AM

## 2015-03-04 ENCOUNTER — Ambulatory Visit (INDEPENDENT_AMBULATORY_CARE_PROVIDER_SITE_OTHER): Payer: Self-pay | Admitting: Advanced Practice Midwife

## 2015-03-04 ENCOUNTER — Encounter: Payer: Self-pay | Admitting: Advanced Practice Midwife

## 2015-03-04 ENCOUNTER — Encounter: Payer: Self-pay | Admitting: Obstetrics & Gynecology

## 2015-03-04 VITALS — BP 95/62 | HR 82 | Temp 98.7°F | Wt 105.0 lb

## 2015-03-04 DIAGNOSIS — Z23 Encounter for immunization: Secondary | ICD-10-CM

## 2015-03-04 DIAGNOSIS — Z124 Encounter for screening for malignant neoplasm of cervix: Secondary | ICD-10-CM

## 2015-03-04 DIAGNOSIS — Z3480 Encounter for supervision of other normal pregnancy, unspecified trimester: Secondary | ICD-10-CM | POA: Insufficient documentation

## 2015-03-04 DIAGNOSIS — N898 Other specified noninflammatory disorders of vagina: Secondary | ICD-10-CM

## 2015-03-04 DIAGNOSIS — Z113 Encounter for screening for infections with a predominantly sexual mode of transmission: Secondary | ICD-10-CM

## 2015-03-04 DIAGNOSIS — Z36 Encounter for antenatal screening of mother: Secondary | ICD-10-CM

## 2015-03-04 DIAGNOSIS — O26892 Other specified pregnancy related conditions, second trimester: Secondary | ICD-10-CM

## 2015-03-04 DIAGNOSIS — Z3482 Encounter for supervision of other normal pregnancy, second trimester: Secondary | ICD-10-CM

## 2015-03-04 DIAGNOSIS — O3680X1 Pregnancy with inconclusive fetal viability, fetus 1: Secondary | ICD-10-CM

## 2015-03-04 LAB — POCT URINALYSIS DIP (DEVICE)
Bilirubin Urine: NEGATIVE
Glucose, UA: NEGATIVE mg/dL
Hgb urine dipstick: NEGATIVE
Ketones, ur: NEGATIVE mg/dL
NITRITE: NEGATIVE
PH: 5.5 (ref 5.0–8.0)
Protein, ur: NEGATIVE mg/dL
Specific Gravity, Urine: 1.025 (ref 1.005–1.030)
UROBILINOGEN UA: 0.2 mg/dL (ref 0.0–1.0)

## 2015-03-04 NOTE — Progress Notes (Signed)
Pt had planned to terminate pregnancy after her ED visit in November but could not afford the procedure. She has uncertain LMP.  She reports frequent dizziness, nausea and mood swings. Pt is concerned about information she was told about the US performed by ED MD.  Bedside US for viability = Single IUP, FHR - 141 bpm per PW doppler, FL - 1.63cm (14w 5d) which is consistent w/LMP. FM present during US.

## 2015-03-05 LAB — PRENATAL PROFILE (SOLSTAS)
ANTIBODY SCREEN: NEGATIVE
BASOS PCT: 0 % (ref 0–1)
Basophils Absolute: 0 10*3/uL (ref 0.0–0.1)
EOS ABS: 0.6 10*3/uL (ref 0.0–0.7)
Eosinophils Relative: 5 % (ref 0–5)
HCT: 33.4 % — ABNORMAL LOW (ref 36.0–46.0)
HEMOGLOBIN: 11.3 g/dL — AB (ref 12.0–15.0)
HEP B S AG: NEGATIVE
HIV 1&2 Ab, 4th Generation: NONREACTIVE
Lymphocytes Relative: 21 % (ref 12–46)
Lymphs Abs: 2.5 10*3/uL (ref 0.7–4.0)
MCH: 28.3 pg (ref 26.0–34.0)
MCHC: 33.8 g/dL (ref 30.0–36.0)
MCV: 83.5 fL (ref 78.0–100.0)
MONOS PCT: 5 % (ref 3–12)
MPV: 9.6 fL (ref 8.6–12.4)
Monocytes Absolute: 0.6 10*3/uL (ref 0.1–1.0)
Neutro Abs: 8.2 10*3/uL — ABNORMAL HIGH (ref 1.7–7.7)
Neutrophils Relative %: 69 % (ref 43–77)
Platelets: 209 10*3/uL (ref 150–400)
RBC: 4 MIL/uL (ref 3.87–5.11)
RDW: 15.4 % (ref 11.5–15.5)
Rh Type: POSITIVE
Rubella: 0.66 Index (ref <0.90)
WBC: 11.9 10*3/uL — ABNORMAL HIGH (ref 4.0–10.5)

## 2015-03-05 LAB — GC/CHLAMYDIA PROBE AMP (~~LOC~~) NOT AT ARMC
Chlamydia: NEGATIVE
Neisseria Gonorrhea: NEGATIVE

## 2015-03-05 LAB — WET PREP, GENITAL
TRICH WET PREP: NONE SEEN
YEAST WET PREP: NONE SEEN

## 2015-03-05 NOTE — Progress Notes (Signed)
Subjective:  Lisa Holt is a 25 y.o. G2P1001 at 2258w5d being seen today for initial OB visit.  She is currently monitored for the following issues for this low-risk pregnancy and has Supervision of normal subsequent pregnancy on her problem list.  Patient reports nausea.  Contractions: Not present. Vag. Bleeding: None.   . Denies leaking of fluid.   The following portions of the patient's history were reviewed and updated as appropriate: allergies, current medications, past family history, past medical history, past social history, past surgical history and problem list. Problem list updated.  Objective:   Filed Vitals:   03/04/15 1439  BP: 95/62  Pulse: 82  Temp: 98.7 F (37.1 C)  Weight: 105 lb (47.628 kg)    Fetal Status:           HR not detected by doppler, FHR 141 by bedside US, see US note by Diane Day, RN.  General:  Alert, oriented and cooperative. Patient is in no acute distress.  Skin: Skin is warm and dry. No rash noted.   Cardiovascular: Normal heart rate noted  Respiratory: Normal respiratory effort, no problems with respiration noted  Abdomen: Soft, gravid, appropriate for gestational age. Pain/Pressure: Present     Pelvic: Vag. Bleeding: None Vag D/C Character: White   Cervical exam performed        Extremities: Normal range of motion.  Edema: None  Mental Status: Normal mood and affect. Normal behavior. Normal judgment and thought content.   Urinalysis: Urine Protein: Negative Urine Glucose: Negative  Assessment and Plan:  Pregnancy: G2P1001 at 5258w5d  1. Encounter for supervision of other normal pregnancy in second trimester - GC[facility]Chlam[facility] amp [facility] - Prenatal Profile - Hemoglobinopathy evaluation - Culture, OB Urine - Prescript Monitor Profile(19) - Cytology - PAP - AFP, Quad Screen - US MFM OB COMP + 14 WK; Future - Amniotic fluid index  2. Flu vaccine need   3. Vaginal discharge during pregnancy, antepartum, second  trimester - Wet prep, genital  4. Examination to determine fetal viability of pregnancy, fetus 1 -Reassurance provided that today's US is wnl, anatomy US ordered. - Amniotic fluid index  Preterm labor symptoms and general obstetric precautions including but not limited to vaginal bleeding, contractions, leaking of fluid and fetal movement were reviewed in detail with the patient. Please refer to After Visit Summary for other counseling recommendations.   Return in about 4 weeks (around 04/01/2015).   Hurshel PartyLisa A Leftwich-Kirby, CNM

## 2015-03-06 ENCOUNTER — Telehealth: Payer: Self-pay | Admitting: *Deleted

## 2015-03-06 ENCOUNTER — Other Ambulatory Visit: Payer: Self-pay | Admitting: Advanced Practice Midwife

## 2015-03-06 DIAGNOSIS — Z3482 Encounter for supervision of other normal pregnancy, second trimester: Secondary | ICD-10-CM

## 2015-03-06 DIAGNOSIS — O26892 Other specified pregnancy related conditions, second trimester: Principal | ICD-10-CM

## 2015-03-06 DIAGNOSIS — N898 Other specified noninflammatory disorders of vagina: Secondary | ICD-10-CM

## 2015-03-06 LAB — CULTURE, OB URINE

## 2015-03-06 LAB — HEMOGLOBINOPATHY EVALUATION
HGB S QUANTITAION: 0 %
Hemoglobin Other: 0 %
Hgb A2 Quant: 2.3 % (ref 2.2–3.2)
Hgb A: 97.7 % (ref 96.8–97.8)
Hgb F Quant: 0 % (ref 0.0–2.0)

## 2015-03-06 LAB — CYTOLOGY - PAP

## 2015-03-06 MED ORDER — CEPHALEXIN 500 MG PO CAPS
500.0000 mg | ORAL_CAPSULE | Freq: Four times a day (QID) | ORAL | Status: DC
Start: 2015-03-06 — End: 2015-06-16

## 2015-03-06 MED ORDER — PRENATAL VITAMIN 27-0.8 MG PO TABS: 1.0000 | ORAL_TABLET | Freq: Every day | ORAL | Status: DC

## 2015-03-06 MED ORDER — METRONIDAZOLE 500 MG PO TABS: 500.0000 mg | ORAL_TABLET | Freq: Two times a day (BID) | ORAL | Status: DC

## 2015-03-06 MED ORDER — ONDANSETRON 4 MG PO TBDP: ORAL_TABLET | ORAL | Status: DC

## 2015-03-06 NOTE — Telephone Encounter (Signed)
Per Sharen CounterLisa Leftwich-Kirby, CNM patient positive for BV, needs flagyl 500 mg bid x7days. Prescription sent to pharmacy. Called patient and notified her of test results and need for antibiotics. Patient voiced understanding, stated she would get meds today.

## 2015-03-06 NOTE — Telephone Encounter (Signed)
Spoke with patient related to after hours call on 01/03/16 @ 4:57pm. She had an initial prenatal visit on 03/04/15. Was told a prescription for prenatal vitamins and zofran would be sent to her pharmacy. However the pharmacy did not receive the orders. After verifying order with Dr Jolayne Pantheronstant, informed patient that her meds would be waiting at her pharmacy later today.

## 2015-03-09 ENCOUNTER — Telehealth: Payer: Self-pay | Admitting: *Deleted

## 2015-03-09 LAB — AFP, QUAD SCREEN
AFP: 35 ng/mL
Curr Gest Age: 14.4 wks.days
HCG, Total: 89.91 IU/mL
INH: 237.9 pg/mL
Interpretation-AFP: NEGATIVE
MOM FOR AFP: 0.93
MoM for INH: 1.1
MoM for hCG: 1.13
Open Spina bifida: NEGATIVE
Osb Risk: 1:34100 {titer}
TRI 18 SCR RISK EST: NEGATIVE
Trisomy 18 (Edward) Syndrome Interp.: 1:97300 {titer}
UE3 VALUE: 0.72 ng/mL
uE3 Mom: 1.21

## 2015-03-09 LAB — CANNABANOIDS (GC/LC/MS), URINE: THC-COOH (GC/LC/MS), ur confirm: 167 ng/mL — AB (ref <5)

## 2015-03-09 NOTE — Telephone Encounter (Signed)
Called pt and informed her of +UTI and medication prescribed. We discussed how she should take the medication and she also needs to complete the Rx for Flagyl as previously prescribed. Pt voiced understanding and will obtain medication from her pharmacy.

## 2015-03-10 ENCOUNTER — Encounter: Payer: Self-pay | Admitting: Advanced Practice Midwife

## 2015-03-10 ENCOUNTER — Telehealth: Payer: Self-pay | Admitting: *Deleted

## 2015-03-10 DIAGNOSIS — O99320 Drug use complicating pregnancy, unspecified trimester: Secondary | ICD-10-CM | POA: Insufficient documentation

## 2015-03-10 LAB — PRESCRIPTION MONITORING PROFILE (19 PANEL)
AMPHETAMINE/METH: NEGATIVE ng/mL
BARBITURATE SCREEN, URINE: NEGATIVE ng/mL
Benzodiazepine Screen, Urine: NEGATIVE ng/mL
Buprenorphine, Urine: NEGATIVE ng/mL
CREATININE, URINE: 120.55 mg/dL (ref >20.0)
Carisoprodol, Urine: NEGATIVE ng/mL
Cocaine Metabolites: NEGATIVE ng/mL
Fentanyl, Ur: NEGATIVE ng/mL
MDMA URINE: NEGATIVE ng/mL
Meperidine, Ur: NEGATIVE ng/mL
Methadone Screen, Urine: NEGATIVE ng/mL
Methaqualone: NEGATIVE ng/mL
Nitrites, Initial: NEGATIVE ug/mL
Opiate Screen, Urine: NEGATIVE ng/mL
Oxycodone Screen, Ur: NEGATIVE ng/mL
PH URINE, INITIAL: 7 pH (ref 4.5–8.9)
PHENCYCLIDINE, UR: NEGATIVE ng/mL
Propoxyphene: NEGATIVE ng/mL
Tapentadol, urine: NEGATIVE ng/mL
Tramadol Scrn, Ur: NEGATIVE ng/mL
ZOLPIDEM, URINE: NEGATIVE ng/mL

## 2015-03-10 NOTE — Telephone Encounter (Signed)
Per Sharen Counter, called patient to notify her of abnormal PAP smear, need for colposcopy. Answered questions about reasons for procedure and importance of follow-up. Patient voiced understanding. Will notify front office of need to schedule and call patient. Patient expecting call regarding scheduling.

## 2015-03-30 ENCOUNTER — Telehealth: Payer: Self-pay | Admitting: *Deleted

## 2015-03-30 ENCOUNTER — Encounter: Payer: Self-pay | Admitting: Obstetrics & Gynecology

## 2015-03-30 NOTE — Telephone Encounter (Signed)
Missed appointment for colpo. Attempted to call to reschedule. No answer, voice mail left stating I was calling to reschedule her appointment, please call back at the clinic.

## 2015-03-31 NOTE — Telephone Encounter (Signed)
Note made on next visit to discuss, also sent mychart message to patient.

## 2015-04-09 ENCOUNTER — Encounter: Payer: Self-pay | Admitting: Obstetrics and Gynecology

## 2015-04-09 ENCOUNTER — Ambulatory Visit (INDEPENDENT_AMBULATORY_CARE_PROVIDER_SITE_OTHER): Payer: Medicaid Other | Admitting: Advanced Practice Midwife

## 2015-04-09 ENCOUNTER — Other Ambulatory Visit: Payer: Self-pay | Admitting: Advanced Practice Midwife

## 2015-04-09 ENCOUNTER — Encounter: Payer: Self-pay | Admitting: Advanced Practice Midwife

## 2015-04-09 ENCOUNTER — Ambulatory Visit (HOSPITAL_COMMUNITY)
Admission: RE | Admit: 2015-04-09 | Discharge: 2015-04-09 | Disposition: A | Discharge status: Home / Self Care | Payer: Medicaid Other | Source: Ambulatory Visit | Attending: Advanced Practice Midwife | Admitting: Advanced Practice Midwife

## 2015-04-09 VITALS — BP 103/65 | HR 69 | Temp 97.8°F | Wt 109.6 lb

## 2015-04-09 DIAGNOSIS — Z3482 Encounter for supervision of other normal pregnancy, second trimester: Secondary | ICD-10-CM

## 2015-04-09 DIAGNOSIS — Z3689 Encounter for other specified antenatal screening: Secondary | ICD-10-CM

## 2015-04-09 DIAGNOSIS — Z36 Encounter for antenatal screening of mother: Secondary | ICD-10-CM | POA: Diagnosis present

## 2015-04-09 DIAGNOSIS — Z3A19 19 weeks gestation of pregnancy: Secondary | ICD-10-CM | POA: Insufficient documentation

## 2015-04-09 LAB — POCT URINALYSIS DIP (DEVICE)
Bilirubin Urine: NEGATIVE
GLUCOSE, UA: NEGATIVE mg/dL
HGB URINE DIPSTICK: NEGATIVE
KETONES UR: NEGATIVE mg/dL
LEUKOCYTES UA: NEGATIVE
Nitrite: NEGATIVE
Protein, ur: NEGATIVE mg/dL
SPECIFIC GRAVITY, URINE: 1.02 (ref 1.005–1.030)
Urobilinogen, UA: 0.2 mg/dL (ref 0.0–1.0)
pH: 7 (ref 5.0–8.0)

## 2015-04-09 NOTE — Progress Notes (Signed)
States stopped taking flagyl because it was making her throw up. Took about 1/2 tablets. C/o soreness in joints/ bones of right hip/thigh. C/o mucousy discharge a few days ago.  Medicaid home form completed.

## 2015-04-09 NOTE — Patient Instructions (Signed)

## 2015-04-09 NOTE — Progress Notes (Signed)
Subjective:  Lisa Holt is a 25 y.o. G2P1001 at [redacted]w[redacted]d being seen today for ongoing prenatal care.  She is currently monitored for the following issues for this low-risk pregnancy and has Supervision of normal subsequent pregnancy and Drug use affecting pregnancy on her problem list.  Patient reports intermittent right groin pain.  Contractions: Not present. Vag. Bleeding: None.  Movement: Present. Denies leaking of fluid.   The following portions of the patient's history were reviewed and updated as appropriate: allergies, current medications, past family history, past medical history, past social history, past surgical history and problem list. Problem list updated.  Objective:   Filed Vitals:   04/09/15 1552  BP: 103/65  Pulse: 69  Temp: 97.8 F (36.6 C)  Weight: 109 lb 9.6 oz (49.714 kg)    Fetal Status: Fetal Heart Rate (bpm): 145   Movement: Present     General:  Alert, oriented and cooperative. Patient is in no acute distress.  Skin: Skin is warm and dry. No rash noted.   Cardiovascular: Normal heart rate noted  Respiratory: Normal respiratory effort, no problems with respiration noted  Abdomen: Soft, gravid, appropriate for gestational age. Pain/Pressure: Present     Pelvic: Vag. Bleeding: None Vag D/C Character: Mucous   Cervical exam deferred        Extremities: Normal range of motion.  Edema: None  Mental Status: Normal mood and affect. Normal behavior. Normal judgment and thought content.   Urinalysis: Urine Protein: Negative Urine Glucose: Negative  Assessment and Plan:  Pregnancy: G2P1001 at [redacted]w[redacted]d  1. Encounter for supervision of other normal pregnancy in second trimester      Had anatomy US done today  Preterm labor symptoms and general obstetric precautions including but not limited to vaginal bleeding, contractions, leaking of fluid and fetal movement were reviewed in detail with the patient. Please refer to After Visit Summary for other counseling  recommendations.  Return in about 4 weeks (around 05/07/2015) for [facility].   Aviva Signs, CNM

## 2015-05-06 ENCOUNTER — Encounter: Payer: Medicaid Other | Admitting: Family

## 2015-05-13 ENCOUNTER — Encounter: Payer: Medicaid Other | Admitting: Family Medicine

## 2015-06-16 ENCOUNTER — Encounter: Payer: Self-pay | Admitting: Family Medicine

## 2015-06-16 ENCOUNTER — Ambulatory Visit (INDEPENDENT_AMBULATORY_CARE_PROVIDER_SITE_OTHER): Payer: Medicaid Other | Admitting: Obstetrics and Gynecology

## 2015-06-16 VITALS — BP 108/65 | HR 79 | Wt 114.3 lb

## 2015-06-16 DIAGNOSIS — O0933 Supervision of pregnancy with insufficient antenatal care, third trimester: Secondary | ICD-10-CM | POA: Diagnosis not present

## 2015-06-16 DIAGNOSIS — Z3483 Encounter for supervision of other normal pregnancy, third trimester: Secondary | ICD-10-CM

## 2015-06-16 DIAGNOSIS — Z23 Encounter for immunization: Secondary | ICD-10-CM | POA: Diagnosis not present

## 2015-06-16 DIAGNOSIS — O99333 Smoking (tobacco) complicating pregnancy, third trimester: Secondary | ICD-10-CM

## 2015-06-16 DIAGNOSIS — O99320 Drug use complicating pregnancy, unspecified trimester: Secondary | ICD-10-CM

## 2015-06-16 LAB — POCT URINALYSIS DIP (DEVICE)
BILIRUBIN URINE: NEGATIVE
Glucose, UA: NEGATIVE mg/dL
HGB URINE DIPSTICK: NEGATIVE
KETONES UR: NEGATIVE mg/dL
NITRITE: NEGATIVE
PH: 7 (ref 5.0–8.0)
Protein, ur: 30 mg/dL — AB
SPECIFIC GRAVITY, URINE: 1.025 (ref 1.005–1.030)
Urobilinogen, UA: 1 mg/dL (ref 0.0–1.0)

## 2015-06-16 LAB — CBC
HEMATOCRIT: 31 % — AB (ref 35.0–45.0)
HEMOGLOBIN: 10.3 g/dL — AB (ref 11.7–15.5)
MCH: 28.8 pg (ref 27.0–33.0)
MCHC: 33.2 g/dL (ref 32.0–36.0)
MCV: 86.6 fL (ref 80.0–100.0)
MPV: 10.3 fL (ref 7.5–12.5)
PLATELETS: 194 10*3/uL (ref 140–400)
RBC: 3.58 MIL/uL — AB (ref 3.80–5.10)
RDW: 15.1 % — ABNORMAL HIGH (ref 11.0–15.0)
WBC: 9.4 10*3/uL (ref 3.8–10.8)

## 2015-06-16 MED ORDER — TETANUS-DIPHTH-ACELL PERTUSSIS 5-2.5-18.5 LF-MCG/0.5 IM SUSP
0.5000 mL | Freq: Once | INTRAMUSCULAR | Status: AC
  Administered 2015-06-16: 0.5 mL via INTRAMUSCULAR

## 2015-06-16 NOTE — Addendum Note (Signed)
Addended by: Cheree DittoGRAHAM, Flora Parks A on: 06/16/2015 03:41 PM   Modules accepted: Orders

## 2015-06-16 NOTE — Progress Notes (Signed)
Pain/pressure- lower abd  28 wk labs today 28 wk packet given  tdap vaccine given

## 2015-06-16 NOTE — Progress Notes (Signed)
Subjective:  Lisa DubonnetSabrina N Drotar is a 25 y.o. G2P1001 at 1170w3d being seen today for ongoing prenatal care.  She is currently monitored for the following issues for this low-risk pregnancy and has Supervision of normal subsequent pregnancy and Drug use affecting pregnancy on her problem list.  Patient reports pel;vic pressure after intercourse or with prolonged standing. Has a 23 yo and reports family stress, moving. Last used marijuana yesterday but plans to quit as of today..  Contractions: Irritability. Vag. Bleeding: None.  Movement: Present. Denies leaking of fluid. No dysuria or back pain.  DNKA due to family issues.  The following portions of the patient's history were reviewed and updated as appropriate: allergies, current medications, past family history, past medical history, past social history, past surgical history and problem list. Problem list updated.  Objective:   Filed Vitals:   06/16/15 1440  BP: 108/65  Pulse: 79  Weight: 114 lb 4.8 oz (51.846 kg)    Fetal Status: Fetal Heart Rate (bpm): 145   Movement: Present     General:  Alert, oriented and cooperative. Patient is in no acute distress.  Skin: Skin is warm and dry. No rash noted.   Cardiovascular: Normal heart rate noted  Respiratory: Normal respiratory effort, no problems with respiration noted  Abdomen: Soft, gravid, appropriate for gestational age. Pain/Pressure: Present     Pelvic: Vag. Bleeding: None     Cervical exam performed      posterior, ext 1, int closed, thick, high  Extremities: Normal range of motion.  Edema: None  Mental Status: Normal mood and affect. Normal behavior. Normal judgment and thought content.   Urinalysis: Urine Protein: 1+ Urine Glucose: Negative  Assessment and Plan:  Pregnancy: G2P1001 at 2670w3d  1. Encounter for supervision of other normal pregnancy in third trimester  - Glucose Tolerance, 1 HR (50g) w/o Fasting - RPR - HIV antibody (with reflex) - CBC  2. Drug use affecting  pregnancy  Urine culture done. Explained UDS today for other illicit drugs due to lapsed care (although admits MJ use).  Preterm labor symptoms and general obstetric precautions including but not limited to vaginal bleeding, contractions, leaking of fluid and fetal movement were reviewed in detail with the patient. Please refer to After Visit Summary for other counseling recommendations.  F/U situationsal stress next visit Return in about 2 weeks (around 06/30/2015).   Danae Orleanseirdre C Nedim Oki, CNM

## 2015-06-17 LAB — RPR

## 2015-06-17 LAB — HIV ANTIBODY (ROUTINE TESTING W REFLEX): HIV: NONREACTIVE

## 2015-06-17 LAB — GLUCOSE TOLERANCE, 1 HOUR (50G) W/O FASTING: Glucose, 1 Hr, gestational: 52 mg/dL — ABNORMAL LOW (ref <140)

## 2015-06-19 LAB — PRESCRIPTION MONITORING PROFILE (19 PANEL)
AMPHETAMINE/METH: NEGATIVE ng/mL
BARBITURATE SCREEN, URINE: NEGATIVE ng/mL
Benzodiazepine Screen, Urine: NEGATIVE ng/mL
Buprenorphine, Urine: NEGATIVE ng/mL
CARISOPRODOL, URINE: NEGATIVE ng/mL
Cocaine Metabolites: NEGATIVE ng/mL
Creatinine, Urine: 164.49 mg/dL (ref >20.0)
Fentanyl, Ur: NEGATIVE ng/mL
MDMA URINE: NEGATIVE ng/mL
MEPERIDINE UR: NEGATIVE ng/mL
METHAQUALONE SCREEN (URINE): NEGATIVE ng/mL
Methadone Screen, Urine: NEGATIVE ng/mL
NITRITES URINE, INITIAL: NEGATIVE ug/mL
OPIATE SCREEN, URINE: NEGATIVE ng/mL
OXYCODONE SCRN UR: NEGATIVE ng/mL
PROPOXYPHENE: NEGATIVE ng/mL
Phencyclidine, Ur: NEGATIVE ng/mL
TAPENTADOLUR: NEGATIVE ng/mL
Tramadol Scrn, Ur: NEGATIVE ng/mL
Zolpidem, Urine: NEGATIVE ng/mL
pH, Initial: 7.5 pH (ref 4.5–8.9)

## 2015-06-19 LAB — CANNABANOIDS (GC/LC/MS), URINE: THC-COOH (GC/LC/MS), ur confirm: 145 ng/mL — AB (ref <5)

## 2015-07-01 ENCOUNTER — Encounter: Payer: Medicaid Other | Admitting: Certified Nurse Midwife

## 2015-07-22 ENCOUNTER — Encounter: Payer: Medicaid Other | Admitting: Certified Nurse Midwife

## 2015-08-06 ENCOUNTER — Ambulatory Visit (INDEPENDENT_AMBULATORY_CARE_PROVIDER_SITE_OTHER): Payer: Medicaid Other | Admitting: Medical

## 2015-08-06 ENCOUNTER — Other Ambulatory Visit (HOSPITAL_COMMUNITY)
Admission: RE | Admit: 2015-08-06 | Discharge: 2015-08-06 | Disposition: A | Discharge status: Home / Self Care | Payer: Medicaid Other | Source: Ambulatory Visit | Attending: Medical | Admitting: Medical

## 2015-08-06 VITALS — BP 104/65 | HR 112 | Wt 116.4 lb

## 2015-08-06 DIAGNOSIS — Z113 Encounter for screening for infections with a predominantly sexual mode of transmission: Secondary | ICD-10-CM | POA: Diagnosis not present

## 2015-08-06 DIAGNOSIS — Z3483 Encounter for supervision of other normal pregnancy, third trimester: Secondary | ICD-10-CM | POA: Diagnosis not present

## 2015-08-06 DIAGNOSIS — K5901 Slow transit constipation: Secondary | ICD-10-CM

## 2015-08-06 DIAGNOSIS — R87613 High grade squamous intraepithelial lesion on cytologic smear of cervix (HGSIL): Secondary | ICD-10-CM | POA: Insufficient documentation

## 2015-08-06 LAB — POCT URINALYSIS DIP (DEVICE)
Bilirubin Urine: NEGATIVE
Glucose, UA: NEGATIVE mg/dL
Hgb urine dipstick: NEGATIVE
Ketones, ur: NEGATIVE mg/dL
Nitrite: NEGATIVE
Protein, ur: NEGATIVE mg/dL
Specific Gravity, Urine: 1.02 (ref 1.005–1.030)
Urobilinogen, UA: 1 mg/dL (ref 0.0–1.0)
pH: 7 (ref 5.0–8.0)

## 2015-08-06 LAB — OB RESULTS CONSOLE GBS: GBS: NEGATIVE

## 2015-08-06 LAB — OB RESULTS CONSOLE GC/CHLAMYDIA: GC PROBE AMP, GENITAL: NEGATIVE

## 2015-08-06 MED ORDER — DOCUSATE SODIUM 250 MG PO CAPS: 250.0000 mg | ORAL_CAPSULE | Freq: Every day | ORAL | Status: DC

## 2015-08-06 NOTE — Progress Notes (Signed)
Subjective:  Lisa Holt is a 25 y.o. G2P1001 at 4075w5d being seen today for ongoing prenatal care.  She is currently monitored for the following issues for this low-risk pregnancy and has Supervision of normal subsequent pregnancy; Drug use affecting pregnancy; Insufficient prenatal care in third trimester; and HSIL (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 on her problem list.  Patient reports backache.  Contractions: Irritability. Vag. Bleeding: None.  Movement: Present. Denies leaking of fluid.   The following portions of the patient's history were reviewed and updated as appropriate: allergies, current medications, past family history, past medical history, past social history, past surgical history and problem list. Problem list updated.  Objective:   Filed Vitals:   08/06/15 1604  BP: 104/65  Pulse: 112  Weight: 116 lb 6.4 oz (52.799 kg)    Fetal Status: Fetal Heart Rate (bpm): 155 Fundal Height: 36 cm Movement: Present     General:  Alert, oriented and cooperative. Patient is in no acute distress.  Skin: Skin is warm and dry. No rash noted.   Cardiovascular: Normal heart rate noted  Respiratory: Normal respiratory effort, no problems with respiration noted  Abdomen: Soft, gravid, appropriate for gestational age. Pain/Pressure: Present     Pelvic: Cervical exam performed Dilation: 1 Effacement (%): Thick Station: Ballotable  Extremities: Normal range of motion.  Edema: None  Mental Status: Normal mood and affect. Normal behavior. Normal judgment and thought content.   Urinalysis: Urine Protein: Negative Urine Glucose: Negative  Assessment and Plan:  Pregnancy: G2P1001 at 6275w5d  1. Encounter for supervision of other normal pregnancy in third trimester - Culture, beta strep (group b only) - GC/Chlamydia probe amp ([place]  2. HSIL (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 - Discussed importance of PP  follow-up and [facility]  3. Slow transit constipation - Rx for Colace sent to patient's pharmacy - Discussed high fiber diet and increased PO hydration  - docusate sodium (COLACE) 250 MG capsule; Take 1 capsule (250 mg total) by mouth daily.  Dispense: 30 capsule; Refill: 0  4. Back pain in pregnancy, third trimester - Discussed use of abdominal binder, warm bath/shower, Tylenol PRN for pain and heat PRN - Discussed moderation of activity and avoiding heavy lifting as triggers for increased back pain   Preterm labor symptoms and general obstetric precautions including but not limited to vaginal bleeding, contractions, leaking of fluid and fetal movement were reviewed in detail with the patient. Please refer to After Visit Summary for other counseling recommendations.  Return in about 1 week (around 08/13/2015) for LOB.   Marny LowensteinJulie N Alphus Zeck, PA-C

## 2015-08-06 NOTE — Patient Instructions (Addendum)
Fetal Movement Counts Patient Name: __________________________________________________ Patient Due Date: ____________________ Performing a fetal movement count is highly recommended in high-risk pregnancies, but it is good for every pregnant woman to do. Your health care provider may ask you to start counting fetal movements at 28 weeks of the pregnancy. Fetal movements often increase:  After eating a full meal.  After physical activity.  After eating or drinking something sweet or cold.  At rest. Pay attention to when you feel the baby is most active. This will help you notice a pattern of your baby's sleep and wake cycles and what factors contribute to an increase in fetal movement. It is important to perform a fetal movement count at the same time each day when your baby is normally most active.  HOW TO COUNT FETAL MOVEMENTS  Find a quiet and comfortable area to sit or lie down on your left side. Lying on your left side provides the best blood and oxygen circulation to your baby.  Write down the day and time on a sheet of paper or in a journal.  Start counting kicks, flutters, swishes, rolls, or jabs in a 2-hour period. You should feel at least 10 movements within 2 hours.  If you do not feel 10 movements in 2 hours, wait 2-3 hours and count again. Look for a change in the pattern or not enough counts in 2 hours. SEEK MEDICAL CARE IF:  You feel less than 10 counts in 2 hours, tried twice.  There is no movement in over an hour.  The pattern is changing or taking longer each day to reach 10 counts in 2 hours.  You feel the baby is not moving as he or she usually does. Date: ____________ Movements: ____________ Start time: ____________ Elizebeth Koller time: ____________  Date: ____________ Movements: ____________ Start time: ____________ Elizebeth Koller time: ____________ Date: ____________ Movements: ____________ Start time: ____________ Elizebeth Koller time: ____________ Date: ____________ Movements:  ____________ Start time: ____________ Elizebeth Koller time: ____________ Date: ____________ Movements: ____________ Start time: ____________ Elizebeth Koller time: ____________ Date: ____________ Movements: ____________ Start time: ____________ Elizebeth Koller time: ____________ Date: ____________ Movements: ____________ Start time: ____________ Elizebeth Koller time: ____________ Date: ____________ Movements: ____________ Start time: ____________ Elizebeth Koller time: ____________  Date: ____________ Movements: ____________ Start time: ____________ Elizebeth Koller time: ____________ Date: ____________ Movements: ____________ Start time: ____________ Elizebeth Koller time: ____________ Date: ____________ Movements: ____________ Start time: ____________ Elizebeth Koller time: ____________ Date: ____________ Movements: ____________ Start time: ____________ Elizebeth Koller time: ____________ Date: ____________ Movements: ____________ Start time: ____________ Elizebeth Koller time: ____________ Date: ____________ Movements: ____________ Start time: ____________ Elizebeth Koller time: ____________ Date: ____________ Movements: ____________ Start time: ____________ Elizebeth Koller time: ____________  Date: ____________ Movements: ____________ Start time: ____________ Elizebeth Koller time: ____________ Date: ____________ Movements: ____________ Start time: ____________ Elizebeth Koller time: ____________ Date: ____________ Movements: ____________ Start time: ____________ Elizebeth Koller time: ____________ Date: ____________ Movements: ____________ Start time: ____________ Elizebeth Koller time: ____________ Date: ____________ Movements: ____________ Start time: ____________ Elizebeth Koller time: ____________ Date: ____________ Movements: ____________ Start time: ____________ Elizebeth Koller time: ____________ Date: ____________ Movements: ____________ Start time: ____________ Elizebeth Koller time: ____________  Date: ____________ Movements: ____________ Start time: ____________ Elizebeth Koller time: ____________ Date: ____________ Movements: ____________ Start time: ____________ Elizebeth Koller  time: ____________ Date: ____________ Movements: ____________ Start time: ____________ Elizebeth Koller time: ____________ Date: ____________ Movements: ____________ Start time: ____________ Elizebeth Koller time: ____________ Date: ____________ Movements: ____________ Start time: ____________ Elizebeth Koller time: ____________ Date: ____________ Movements: ____________ Start time: ____________ Elizebeth Koller time: ____________ Date: ____________ Movements: ____________ Start time: ____________ Elizebeth Koller time: ____________  Date: ____________ Movements: ____________ Start time: ____________ Elizebeth Koller  time: ____________ °Date: ____________ Movements: ____________ Start time: ____________ Finish time: ____________ °Date: ____________ Movements: ____________ Start time: ____________ Finish time: ____________ °Date: ____________ Movements: ____________ Start time: ____________ Finish time: ____________ °Date: ____________ Movements: ____________ Start time: ____________ Finish time: ____________ °Date: ____________ Movements: ____________ Start time: ____________ Finish time: ____________ °Date: ____________ Movements: ____________ Start time: ____________ Finish time: ____________  °Date: ____________ Movements: ____________ Start time: ____________ Finish time: ____________ °Date: ____________ Movements: ____________ Start time: ____________ Finish time: ____________ °Date: ____________ Movements: ____________ Start time: ____________ Finish time: ____________ °Date: ____________ Movements: ____________ Start time: ____________ Finish time: ____________ °Date: ____________ Movements: ____________ Start time: ____________ Finish time: ____________ °Date: ____________ Movements: ____________ Start time: ____________ Finish time: ____________ °Date: ____________ Movements: ____________ Start time: ____________ Finish time: ____________  °Date: ____________ Movements: ____________ Start time: ____________ Finish time: ____________ °Date: ____________  Movements: ____________ Start time: ____________ Finish time: ____________ °Date: ____________ Movements: ____________ Start time: ____________ Finish time: ____________ °Date: ____________ Movements: ____________ Start time: ____________ Finish time: ____________ °Date: ____________ Movements: ____________ Start time: ____________ Finish time: ____________ °Date: ____________ Movements: ____________ Start time: ____________ Finish time: ____________ °Date: ____________ Movements: ____________ Start time: ____________ Finish time: ____________  °Date: ____________ Movements: ____________ Start time: ____________ Finish time: ____________ °Date: ____________ Movements: ____________ Start time: ____________ Finish time: ____________ °Date: ____________ Movements: ____________ Start time: ____________ Finish time: ____________ °Date: ____________ Movements: ____________ Start time: ____________ Finish time: ____________ °Date: ____________ Movements: ____________ Start time: ____________ Finish time: ____________ °Date: ____________ Movements: ____________ Start time: ____________ Finish time: ____________ °  °This information is not intended to replace advice given to you by your health care provider. Make sure you discuss any questions you have with your health care provider. °  °Document Released: 03/09/2006 Document Revised: 02/28/2014 Document Reviewed: 12/05/2011 °Elsevier Interactive Patient Education ©2016 Elsevier Inc[employer] °Braxton Hicks Contractions °Contractions of the uterus can occur throughout pregnancy. Contractions are not always a sign that you are in labor.  °WHAT ARE BRAXTON HICKS CONTRACTIONS?  °Contractions that occur before labor are called Braxton Hicks contractions, or false labor. Toward the end of pregnancy (32-34 weeks), these contractions can develop more often and may become more forceful. This is not true labor because these contractions do not result in opening (dilatation) and thinning of  the cervix. They are sometimes difficult to tell apart from true labor because these contractions can be forceful and people have different pain tolerances. You should not feel embarrassed if you go to the hospital with false labor. Sometimes, the only way to tell if you are in true labor is for your health care provider to look for changes in the cervix. °If there are no prenatal problems or other health problems associated with the pregnancy, it is completely safe to be sent home with false labor and await the onset of true labor. °HOW CAN YOU TELL THE DIFFERENCE BETWEEN TRUE AND FALSE LABOR? °False Labor °· The contractions of false labor are usually shorter and not as hard as those of true labor.   °· The contractions are usually irregular.   °· The contractions are often felt in the front of the lower abdomen and in the groin.   °· The contractions may go away when you walk around or change positions while lying down.   °· The contractions get weaker and are shorter lasting as time goes on.   °· The contractions do not usually become progressively stronger, regular, and closer together as with true labor.   °True Labor °· Contractions in true   labor last 30-70 seconds, become very regular, usually become more intense, and increase in frequency.   The contractions do not go away with walking.   The discomfort is usually felt in the top of the uterus and spreads to the lower abdomen and low back.   True labor can be determined by your health care provider with an exam. This will show that the cervix is dilating and getting thinner.  WHAT TO REMEMBER  Keep up with your usual exercises and follow other instructions given by your health care provider.   Take medicines as directed by your health care provider.   Keep your regular prenatal appointments.   Eat and drink lightly if you think you are going into labor.   If Braxton Hicks contractions are making you uncomfortable:   Change your  position from lying down or resting to walking, or from walking to resting.   Sit and rest in a tub of warm water.   Drink 2-3 glasses of water. Dehydration may cause these contractions.   Do slow and deep breathing several times an hour.  WHEN SHOULD I SEEK IMMEDIATE MEDICAL CARE? Seek immediate medical care if:  Your contractions become stronger, more regular, and closer together.   You have fluid leaking or gushing from your vagina.   You have a fever.   You pass blood-tinged mucus.   You have vaginal bleeding.   You have continuous abdominal pain.   You have low back pain that you never had before.   You feel your baby's head pushing down and causing pelvic pressure.   Your baby is not moving as much as it used to.    This information is not intended to replace advice given to you by your health care provider. Make sure you discuss any questions you have with your health care provider.   Document Released: 02/07/2005 Document Revised: 02/12/2013 Document Reviewed: 11/19/2012 Elsevier Interactive Patient Education 2016 Elsevier Inc. Cervical Dysplasia Cervical dysplasia is a condition in which a woman has abnormal changes in the cells of her cervix. The cervix is the opening to the uterus (womb). It is located between the vagina and the uterus. Cervical dysplasia may be the first sign of cervical cancer.  With early detection, treatment, and close follow-up care, nearly all cases of cervical dysplasia can be cured. If left untreated, dysplasia may become more severe.  CAUSES  Cervical dysplasia can be caused by a human papillomavirus (HPV) infection. RISK FACTORS   Having had a sexually transmitted disease, such as chlamydia or a human papillomavirus (HPV) infection.   Becoming sexually active before age 30.   Having had more than one sexual partner.   Not using protection during sexual intercourse, especially with new sexual partners.   Having had  cancer of the vagina or vulva.   Having a sexual partner whose previous partner had cancer of the cervix or cervical dysplasia.   Having a sexual partner who has or has had cancer of the penis.   Having a weakened immune system (such as from having HIV or an organ transplant).   Being the daughter of a woman who took diethylstilbestrol(DES) during pregnancy.   Having a family history of cervical cancer.   Smoking. SIGNS AND SYMPTOMS  There are usually no symptoms. If there are symptoms, they may include:   Abnormal vaginal discharge.   Bleeding between periods or after intercourse.   Bleeding during menopause.   Pain during sexual intercourse (dyspareunia). DIAGNOSIS  A  test called a Pap test may be done.During this test, cells are taken from the cervix and then looked at under a microscope. A test in which tissue is removed from the cervix (biopsy) may also be done if the Pap test is abnormal or if the cervix looks abnormal.  TREATMENT  Treatment varies based on the severity of the cervical dysplasia. Treatment may include:  Cryotherapy. During cryotherapy, the abnormal cells are frozen with a steel-tip instrument.   A procedure to remove abnormal tissue from the cervix.  Surgery to remove abnormal tissue. This is usually done in serious cases of cervical dysplasia. Surgical options include:  A cone biopsy. This is a procedure in which the cervical canal and a portion of the center of the cervix are removed.   Hysterectomy. This is a surgery in which the uterus and cervix are removed. HOME CARE INSTRUCTIONS   Only take over-the-counter or prescription medicines for pain or discomfort as directed by your health care provider.   Do not use tampons, have sexual intercourse, or douche until your health care provider says it is okay.  Keep follow-up appointments as directed by your health care provider. Women who have been treated for cervical dysplasia should  have regular pelvic exams and Pap tests. During the first year following treatment of cervical dysplasia, Pap tests should be done every 3-4 months. In the second year, they should be done every 6 months or as recommended by your health care provider.  To prevent the condition from developing again, practice safe sex. SEEK MEDICAL CARE IF:  You develop genital warts.  SEEK IMMEDIATE MEDICAL CARE IF:   Your menstrual period is heavier than normal.   You develop bright red bleeding, especially if you have blood clots.   You have a fever.   You have increasing cramps or pain not relieved with medicine.   You are light-headed, unusually weak, or have fainting spells.   You have abnormal vaginal discharge.   You have abdominal pain.   This information is not intended to replace advice given to you by your health care provider. Make sure you discuss any questions you have with your health care provider.   Document Released: 02/07/2005 Document Revised: 02/12/2013 Document Reviewed: 10/03/2012 Elsevier Interactive Patient Education 2016 Elsevier Inc. High-Fiber Diet Fiber, also called dietary fiber, is a type of carbohydrate found in fruits, vegetables, whole grains, and beans. A high-fiber diet can have many health benefits. Your health care provider may recommend a high-fiber diet to help:  Prevent constipation. Fiber can make your bowel movements more regular.  Lower your cholesterol.  Relieve hemorrhoids, uncomplicated diverticulosis, or irritable bowel syndrome.  Prevent overeating as part of a weight-loss plan.  Prevent heart disease, type 2 diabetes, and certain cancers. WHAT IS MY PLAN? The recommended daily intake of fiber includes:  38 grams for men under age 23.  30 grams for men over age 23.  25 grams for women under age 23.  21 grams for women over age 23. You can get the recommended daily intake of dietary fiber by eating a variety of fruits, vegetables,  grains, and beans. Your health care provider may also recommend a fiber supplement if it is not possible to get enough fiber through your diet. WHAT DO I NEED TO KNOW ABOUT A HIGH-FIBER DIET?  Fiber supplements have not been widely studied for their effectiveness, so it is better to get fiber through food sources.  Always check the fiber content on thenutrition  facts label of any prepackaged food. Look for foods that contain at least 5 grams of fiber per serving.  Ask your dietitian if you have questions about specific foods that are related to your condition, especially if those foods are not listed in the following section.  Increase your daily fiber consumption gradually. Increasing your intake of dietary fiber too quickly may cause bloating, cramping, or gas.  Drink plenty of water. Water helps you to digest fiber. WHAT FOODS CAN I EAT? Grains Whole-grain breads. Multigrain cereal. Oats and oatmeal. Brown rice. Barley. Bulgur wheat. Millet. Bran muffins. Popcorn. Rye wafer crackers. Vegetables Sweet potatoes. Spinach. Kale. Artichokes. Cabbage. Broccoli. Green peas. Carrots. Squash. Fruits Berries. Pears. Apples. Oranges. Avocados. Prunes and raisins. Dried figs. Meats and Other Protein Sources Navy, kidney, pinto, and soy beans. Split peas. Lentils. Nuts and seeds. Dairy Fiber-fortified yogurt. Beverages Fiber-fortified soy milk. Fiber-fortified orange juice. Other Fiber bars. The items listed above may not be a complete list of recommended foods or beverages. Contact your dietitian for more options. WHAT FOODS ARE NOT RECOMMENDED? Grains White bread. Pasta made with refined flour. White rice. Vegetables Fried potatoes. Canned vegetables. Well-cooked vegetables.  Fruits Fruit juice. Cooked, strained fruit. Meats and Other Protein Sources Fatty cuts of meat. Fried Environmental education officer or fried fish. Dairy Milk. Yogurt. Cream cheese. Sour cream. Beverages Soft drinks. Other Cakes and  pastries. Butter and oils. The items listed above may not be a complete list of foods and beverages to avoid. Contact your dietitian for more information. WHAT ARE SOME TIPS FOR INCLUDING HIGH-FIBER FOODS IN MY DIET?  Eat a wide variety of high-fiber foods.  Make sure that half of all grains consumed each day are whole grains.  Replace breads and cereals made from refined flour or white flour with whole-grain breads and cereals.  Replace white rice with brown rice, bulgur wheat, or millet.  Start the day with a breakfast that is high in fiber, such as a cereal that contains at least 5 grams of fiber per serving.  Use beans in place of meat in soups, salads, or pasta.  Eat high-fiber snacks, such as berries, raw vegetables, nuts, or popcorn.   This information is not intended to replace advice given to you by your health care provider. Make sure you discuss any questions you have with your health care provider.   Document Released: 02/07/2005 Document Revised: 02/28/2014 Document Reviewed: 07/23/2013 Elsevier Interactive Patient Education 2016 [employer]. Constipation, Adult Constipation is when a person:  Poops (has a bowel movement) less than 3 times a week.  Has a hard time pooping.  Has poop that is dry, hard, or bigger than normal. HOME CARE   Eat foods with a lot of fiber in them. This includes fruits, vegetables, beans, and whole grains such as brown rice.  Avoid fatty foods and foods with a lot of sugar. This includes french fries, hamburgers, cookies, candy, and soda.  If you are not getting enough fiber from food, take products with added fiber in them (supplements).  Drink enough fluid to keep your pee (urine) clear or pale yellow.  Exercise on a regular basis, or as told by your doctor.  Go to the restroom when you feel like you need to poop. Do not hold it.  Only take medicine as told by your doctor. Do not take medicines that help you poop (laxatives)  without talking to your doctor first. GET HELP RIGHT AWAY IF:   You have bright red blood in  your poop (stool).  Your constipation lasts more than 4 days or gets worse.  You have belly (abdominal) or butt (rectal) pain.  You have thin poop (as thin as a pencil).  You lose weight, and it cannot be explained. MAKE SURE YOU:   Understand these instructions.  Will watch your condition.  Will get help right away if you are not doing well or get worse.   This information is not intended to replace advice given to you by your health care provider. Make sure you discuss any questions you have with your health care provider.   Document Released: 07/27/2007 Document Revised: 02/28/2014 Document Reviewed: 11/19/2012 Elsevier Interactive Patient Education [employer]

## 2015-08-06 NOTE — Progress Notes (Signed)
Cultures today 

## 2015-08-07 LAB — GC/CHLAMYDIA PROBE AMP (~~LOC~~) NOT AT ARMC
Chlamydia: NEGATIVE
NEISSERIA GONORRHEA: NEGATIVE

## 2015-08-08 LAB — CULTURE, BETA STREP (GROUP B ONLY)

## 2015-08-13 ENCOUNTER — Ambulatory Visit (INDEPENDENT_AMBULATORY_CARE_PROVIDER_SITE_OTHER): Payer: Medicaid Other | Admitting: Obstetrics and Gynecology

## 2015-08-13 VITALS — BP 104/66 | HR 98 | Wt 118.6 lb

## 2015-08-13 DIAGNOSIS — Z3483 Encounter for supervision of other normal pregnancy, third trimester: Secondary | ICD-10-CM

## 2015-08-13 DIAGNOSIS — O9989 Other specified diseases and conditions complicating pregnancy, childbirth and the puerperium: Secondary | ICD-10-CM

## 2015-08-13 DIAGNOSIS — R87613 High grade squamous intraepithelial lesion on cytologic smear of cervix (HGSIL): Secondary | ICD-10-CM

## 2015-08-13 DIAGNOSIS — N898 Other specified noninflammatory disorders of vagina: Secondary | ICD-10-CM

## 2015-08-13 LAB — POCT URINALYSIS DIP (DEVICE)
Bilirubin Urine: NEGATIVE
Glucose, UA: NEGATIVE mg/dL
HGB URINE DIPSTICK: NEGATIVE
Ketones, ur: NEGATIVE mg/dL
NITRITE: NEGATIVE
PH: 7 (ref 5.0–8.0)
Protein, ur: NEGATIVE mg/dL
SPECIFIC GRAVITY, URINE: 1.015 (ref 1.005–1.030)
UROBILINOGEN UA: 0.2 mg/dL (ref 0.0–1.0)

## 2015-08-13 NOTE — Progress Notes (Signed)
Subjective:  Lisa Holt is a 25 y.o. G2P1001 at 3954w5d being seen today for ongoing prenatal care.  She is currently monitored for the following issues for this low-risk pregnancy and has Supervision of normal subsequent pregnancy; Drug use affecting pregnancy; Insufficient prenatal care in third trimester; and HSIL (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 on her problem list.  Patient reports some vaginal itching and discharge. gc/ct/gbs neg last visit. .   Contractions: Irritability. Vag. Bleeding: None.  Movement: Present. Denies leaking of fluid.   The following portions of the patient's history were reviewed and updated as appropriate: allergies, current medications, past family history, past medical history, past social history, past surgical history and problem list. Problem list updated.  Objective:   Filed Vitals:   08/13/15 1309  BP: 104/66  Pulse: 98  Weight: 118 lb 9.6 oz (53.797 kg)    Fetal Status:     Movement: Present     General:  Alert, oriented and cooperative. Patient is in no acute distress.  Skin: Skin is warm and dry. No rash noted.   Cardiovascular: Normal heart rate noted  Respiratory: Normal respiratory effort, no problems with respiration noted  Abdomen: Soft, gravid, appropriate for gestational age. Pain/Pressure: Present     Pelvic:  Cervical exam deferred        Extremities: Normal range of motion.  Edema: None  Mental Status: Normal mood and affect. Normal behavior. Normal judgment and thought content.   Urinalysis: Urine Protein: Negative Urine Glucose: Negative  Assessment and Plan:  Pregnancy: G2P1001 at 5954w5d  1. Vaginal discharge egbus a little pink. Recommended otc monistat 7   2. Encounter for supervision of other normal pregnancy in third trimester Routine care. nexplanon  3. HSIL (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 D/w her importance of PP colpo  Preterm labor symptoms and  general obstetric precautions including but not limited to vaginal bleeding, contractions, leaking of fluid and fetal movement were reviewed in detail with the patient. Please refer to After Visit Summary for other counseling recommendations.  Return in about 1 week (around 08/20/2015).   [place] Lisa Carsynn Bethune, MD

## 2015-08-13 NOTE — Progress Notes (Signed)
Urine: large amt wbcs

## 2015-08-13 NOTE — Addendum Note (Signed)
Addended by: Garret ReddishBARNES, Sherrita Riederer M on: 08/13/2015 03:18 PM   Modules accepted: Orders

## 2015-08-19 ENCOUNTER — Ambulatory Visit (INDEPENDENT_AMBULATORY_CARE_PROVIDER_SITE_OTHER): Payer: Medicaid Other | Admitting: Obstetrics & Gynecology

## 2015-08-19 VITALS — BP 101/63 | HR 93 | Wt 117.0 lb

## 2015-08-19 DIAGNOSIS — O99320 Drug use complicating pregnancy, unspecified trimester: Secondary | ICD-10-CM

## 2015-08-19 DIAGNOSIS — Z3483 Encounter for supervision of other normal pregnancy, third trimester: Secondary | ICD-10-CM

## 2015-08-19 DIAGNOSIS — O99323 Drug use complicating pregnancy, third trimester: Secondary | ICD-10-CM

## 2015-08-19 LAB — POCT URINALYSIS DIP (DEVICE)
Bilirubin Urine: NEGATIVE
GLUCOSE, UA: NEGATIVE mg/dL
Hgb urine dipstick: NEGATIVE
Ketones, ur: NEGATIVE mg/dL
NITRITE: NEGATIVE
Protein, ur: NEGATIVE mg/dL
SPECIFIC GRAVITY, URINE: 1.015 (ref 1.005–1.030)
UROBILINOGEN UA: 1 mg/dL (ref 0.0–1.0)
pH: 6.5 (ref 5.0–8.0)

## 2015-08-19 NOTE — Patient Instructions (Signed)
Return to clinic for any scheduled appointments or obstetric concerns, or go to MAU for evaluation  

## 2015-08-19 NOTE — Progress Notes (Signed)
Subjective:  Lisa Holt is a 25 y.o. G2P1001 at 5624w4d being seen today for ongoing prenatal care.  She is currently monitored for the following issues for this low-risk pregnancy and has Supervision of normal subsequent pregnancy; Drug use affecting pregnancy; Insufficient prenatal care in third trimester; and HSIL (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 on her problem list.  Patient reports no complaints.  Contractions: Irritability. Vag. Bleeding: None.  Movement: Present. Denies leaking of fluid.   The following portions of the patient's history were reviewed and updated as appropriate: allergies, current medications, past family history, past medical history, past social history, past surgical history and problem list. Problem list updated.  Objective:   Filed Vitals:   08/19/15 1535  BP: 101/63  Pulse: 93  Weight: 117 lb (53.071 kg)    Fetal Status: Fetal Heart Rate (bpm): 135 Fundal Height: 39 cm Movement: Present     General:  Alert, oriented and cooperative. Patient is in no acute distress.  Skin: Skin is warm and dry. No rash noted.   Cardiovascular: Normal heart rate noted  Respiratory: Normal respiratory effort, no problems with respiration noted  Abdomen: Soft, gravid, appropriate for gestational age. Pain/Pressure: Present     Pelvic: Cervical exam deferred        Extremities: Normal range of motion.  Edema: None  Mental Status: Normal mood and affect. Normal behavior. Normal judgment and thought content.   Urinalysis: Urine Protein: Negative Urine Glucose: Negative  Assessment and Plan:  Pregnancy: G2P1001 at 7924w4d  1. Drug use affecting pregnancy UDS sent today - Pain Mgmt, Profile 6 Conf w/o mM, U  2. Encounter for supervision of other normal pregnancy in third trimester Term labor symptoms and general obstetric precautions including but not limited to vaginal bleeding, contractions, leaking of fluid and fetal movement were reviewed  in detail with the patient. Please refer to After Visit Summary for other counseling recommendations.  Return in about 1 week (around 08/26/2015) for OB Visit.   Tereso NewcomerUgonna A Aarti Mankowski, MD

## 2015-08-20 ENCOUNTER — Encounter: Payer: Self-pay | Admitting: Family Medicine

## 2015-08-24 DIAGNOSIS — Z719 Counseling, unspecified: Secondary | ICD-10-CM

## 2015-08-27 ENCOUNTER — Ambulatory Visit (INDEPENDENT_AMBULATORY_CARE_PROVIDER_SITE_OTHER): Payer: Medicaid Other | Admitting: Clinical

## 2015-08-27 ENCOUNTER — Ambulatory Visit (INDEPENDENT_AMBULATORY_CARE_PROVIDER_SITE_OTHER): Payer: Medicaid Other | Admitting: Family Medicine

## 2015-08-27 VITALS — BP 98/66 | HR 95 | Wt 116.0 lb

## 2015-08-27 DIAGNOSIS — O2243 Hemorrhoids in pregnancy, third trimester: Secondary | ICD-10-CM

## 2015-08-27 DIAGNOSIS — K649 Unspecified hemorrhoids: Secondary | ICD-10-CM

## 2015-08-27 DIAGNOSIS — F4322 Adjustment disorder with anxiety: Secondary | ICD-10-CM | POA: Diagnosis not present

## 2015-08-27 DIAGNOSIS — O0933 Supervision of pregnancy with insufficient antenatal care, third trimester: Secondary | ICD-10-CM | POA: Diagnosis present

## 2015-08-27 DIAGNOSIS — K5900 Constipation, unspecified: Secondary | ICD-10-CM

## 2015-08-27 DIAGNOSIS — Z3483 Encounter for supervision of other normal pregnancy, third trimester: Secondary | ICD-10-CM

## 2015-08-27 LAB — POCT URINALYSIS DIP (DEVICE)
BILIRUBIN URINE: NEGATIVE
GLUCOSE, UA: NEGATIVE mg/dL
HGB URINE DIPSTICK: NEGATIVE
KETONES UR: NEGATIVE mg/dL
Nitrite: NEGATIVE
Protein, ur: NEGATIVE mg/dL
SPECIFIC GRAVITY, URINE: 1.015 (ref 1.005–1.030)
UROBILINOGEN UA: 1 mg/dL (ref 0.0–1.0)
pH: 7 (ref 5.0–8.0)

## 2015-08-27 MED ORDER — DOCUSATE SODIUM 100 MG PO CAPS: 100.0000 mg | ORAL_CAPSULE | Freq: Two times a day (BID) | ORAL | Status: DC

## 2015-08-27 MED ORDER — POLYETHYLENE GLYCOL 3350 17 G PO PACK: 17.0000 g | PACK | Freq: Every day | ORAL | Status: DC

## 2015-08-27 MED ORDER — HYDROCORTISONE 2.5 % RE CREA: 1.0000 "application " | TOPICAL_CREAM | Freq: Two times a day (BID) | RECTAL | Status: DC

## 2015-08-27 NOTE — Progress Notes (Signed)
Patient states a few days ago her left lower leg, ankle, and foot have been swollen. Patient reports tenderness to affected area especially with extension

## 2015-08-27 NOTE — Progress Notes (Signed)
Subjective:  Lisa Holt is a 25 y.o. G2P1001 at 3542w5d being seen today for ongoing prenatal care.  She is currently monitored for the following issues for this low-risk pregnancy and has Supervision of normal subsequent pregnancy; Drug use affecting pregnancy; Insufficient prenatal care in third trimester; and HSIL (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 on her problem list.  Patient reports occasional contractions and swelling in her legs, left leg cramping (mostly at night), hemorrhoid pain with bowel movement. Last bowel movement about 4 days ago. Denies headache, vision changes or RUQ pain. She has been drinking apple juice and water. Contractions: Irritability. Vag. Bleeding: None.  Movement: Present. Denies leaking of fluid.   The following portions of the patient's history were reviewed and updated as appropriate: allergies, current medications, past family history, past medical history, past social history, past surgical history and problem list. Problem list updated.  Objective:   Filed Vitals:   08/27/15 1343  BP: 98/66  Pulse: 95  Weight: 116 lb (52.617 kg)    Fetal Status: Fetal Heart Rate (bpm): 141   Movement: Present     General:  Alert, oriented and cooperative. Patient is in no acute distress.  Skin: Skin is warm and dry. No rash noted.   Cardiovascular: Normal heart rate noted  Respiratory: Normal respiratory effort, no problems with respiration noted  Abdomen: Soft, gravid, appropriate for gestational age. Pain/Pressure: Present     Pelvic:  Cervical exam performed      2/40/-2  Extremities: Normal range of motion.  Edema: Trace. Mildly tender to palpation over left calf, no swelling or erythema  Mental Status: Normal mood and affect. Normal behavior. Normal judgment and thought content.   Urinalysis: Urine Protein: Negative Urine Glucose: Negative  Assessment and Plan:  Pregnancy: G2P1001 at 2242w5d  1. Encounter for supervision of  other normal pregnancy in third trimester.  -doing well -cervical sweeping today -return in one week  2. Constipation:  -Miralax then colace. Rx sent to her pharmacy  3. Hemorrhoid:  -Anusol-HC. Rx sent to the pharmacy  4. Left leg cramping: exam not suggestive for DVT.  -Recommended wrapping with bandage or using heating pad  Term labor symptoms and general obstetric precautions including but not limited to vaginal bleeding, contractions, leaking of fluid and fetal movement were reviewed in detail with the patient. Please refer to After Visit Summary for other counseling recommendations.  Return in about 1 week (around 09/03/2015) for LR OB f/u.

## 2015-08-27 NOTE — Progress Notes (Signed)
  ASSESSMENT: Pt currently experiencing Adjustment disorder with anxious mood.  Pt needs to f/u with OB.  Pt would benefit from psychoeducation and brief therapeutic intervention regarding coping with symptoms of anxiety. Pt may benefit from community resources.  Stage of Change: contemplative  PLAN: 1. F/U with behavioral health clinician in one month, or as needed 2. Psychiatric Medications: none 3. Behavioral recommendations:   -Consider contacting [employer] housing hotline [phone] for housing questions -Consider reading educational material regarding coping with symptoms of anxiety  SUBJECTIVE: Pt. referred by Adrian BlackwaterStinson, DO,  for symptoms of anxiety Pt. reports the following symptoms/concerns: Pt states that her primary concern today is unstable housing, as FOB's mother wants pt, FOB, and children in their own place within one month. Pt says that she is feeling an increase in stress over financial and life stressors, but feels she is coping "okay".  Duration of problem: Over one month Severity: moderate   OBJECTIVE: Orientation & Cognition: Oriented x3. Thought processes normal and appropriate to situation. Mood: appropriate Affect: appropriate Appearance: appropriate Risk of harm to self or others: no risk of harm to self or others Substance use: none Assessments administered: PHQ9: 9/ GAD7: 12  Diagnosis: Adjustment disorder with anxious mood CPT Code: F43.22  -------------------------------------------- Other(s) present in the room: 2yo daughter  Time spent with patient in exam room: 30 minutes, 2:45-3:15pm  [facility] 08/27/2015  Decreased Interest 0  Down, Depressed, Hopeless 0  PHQ - 2 Score 0  Altered sleeping 2  Tired, decreased energy 3  Change in appetite 2  Feeling bad or failure about yourself  0  Trouble concentrating 0  Moving slowly or fidgety/restless 2  Suicidal thoughts 0  PHQ-9 Score 9    GAD 7 : Generalized  Anxiety Score 08/27/2015  Nervous, Anxious, on Edge 3  Control/stop worrying 3  Worry too much - different things 3  Trouble relaxing 1  Restless 0  Easily annoyed or irritable 2  Afraid - awful might happen 0  Total GAD 7 Score 12

## 2015-09-01 ENCOUNTER — Encounter (HOSPITAL_COMMUNITY): Payer: Self-pay

## 2015-09-01 ENCOUNTER — Inpatient Hospital Stay (HOSPITAL_COMMUNITY): Payer: Medicaid Other | Admitting: Anesthesiology

## 2015-09-01 ENCOUNTER — Inpatient Hospital Stay (HOSPITAL_COMMUNITY)
Admission: AD | Admit: 2015-09-01 | Discharge: 2015-09-03 | DRG: 775 | Disposition: A | Discharge status: Home / Self Care | Payer: Medicaid Other | Source: Ambulatory Visit | Attending: Obstetrics & Gynecology | Admitting: Obstetrics & Gynecology

## 2015-09-01 DIAGNOSIS — Z8249 Family history of ischemic heart disease and other diseases of the circulatory system: Secondary | ICD-10-CM

## 2015-09-01 DIAGNOSIS — Z823 Family history of stroke: Secondary | ICD-10-CM

## 2015-09-01 DIAGNOSIS — O4202 Full-term premature rupture of membranes, onset of labor within 24 hours of rupture: Principal | ICD-10-CM | POA: Diagnosis present

## 2015-09-01 DIAGNOSIS — O99334 Smoking (tobacco) complicating childbirth: Secondary | ICD-10-CM | POA: Diagnosis present

## 2015-09-01 DIAGNOSIS — F17210 Nicotine dependence, cigarettes, uncomplicated: Secondary | ICD-10-CM | POA: Diagnosis present

## 2015-09-01 DIAGNOSIS — R87613 High grade squamous intraepithelial lesion on cytologic smear of cervix (HGSIL): Secondary | ICD-10-CM | POA: Diagnosis present

## 2015-09-01 DIAGNOSIS — Z3A40 40 weeks gestation of pregnancy: Secondary | ICD-10-CM

## 2015-09-01 DIAGNOSIS — Z3483 Encounter for supervision of other normal pregnancy, third trimester: Secondary | ICD-10-CM

## 2015-09-01 DIAGNOSIS — Z833 Family history of diabetes mellitus: Secondary | ICD-10-CM | POA: Diagnosis not present

## 2015-09-01 DIAGNOSIS — O99320 Drug use complicating pregnancy, unspecified trimester: Secondary | ICD-10-CM

## 2015-09-01 LAB — TYPE AND SCREEN
ABO/RH(D): AB POS
ANTIBODY SCREEN: NEGATIVE

## 2015-09-01 LAB — POCT FERN TEST: POCT Fern Test: POSITIVE

## 2015-09-01 LAB — CBC
HEMATOCRIT: 30.1 % — AB (ref 36.0–46.0)
Hemoglobin: 10.2 g/dL — ABNORMAL LOW (ref 12.0–15.0)
MCH: 28.7 pg (ref 26.0–34.0)
MCHC: 33.9 g/dL (ref 30.0–36.0)
MCV: 84.6 fL (ref 78.0–100.0)
PLATELETS: 204 10*3/uL (ref 150–400)
RBC: 3.56 MIL/uL — ABNORMAL LOW (ref 3.87–5.11)
RDW: 15.2 % (ref 11.5–15.5)
WBC: 10.5 10*3/uL (ref 4.0–10.5)

## 2015-09-01 LAB — ABO/RH: ABO/RH(D): AB POS

## 2015-09-01 MED ORDER — DIPHENHYDRAMINE HCL 50 MG/ML IJ SOLN: 12.5000 mg | INTRAMUSCULAR | Status: DC | PRN

## 2015-09-01 MED ORDER — ONDANSETRON HCL 4 MG/2ML IJ SOLN
4.0000 mg | Freq: Four times a day (QID) | INTRAMUSCULAR | Status: DC | PRN
  Administered 2015-09-01: 4 mg via INTRAVENOUS
  Filled 2015-09-01: qty 2

## 2015-09-01 MED ORDER — LACTATED RINGERS IV SOLN
500.0000 mL | INTRAVENOUS | Status: DC | PRN
  Administered 2015-09-01 (×2): 500 mL via INTRAVENOUS

## 2015-09-01 MED ORDER — LIDOCAINE HCL (PF) 1 % IJ SOLN
INTRAMUSCULAR | Status: DC | PRN
  Administered 2015-09-01: 3 mL via EPIDURAL
  Administered 2015-09-01: 2 mL via EPIDURAL
  Administered 2015-09-01: 5 mL via EPIDURAL

## 2015-09-01 MED ORDER — EPHEDRINE 5 MG/ML INJ
10.0000 mg | INTRAVENOUS | Status: DC | PRN
  Filled 2015-09-01: qty 2

## 2015-09-01 MED ORDER — PHENYLEPHRINE 40 MCG/ML (10ML) SYRINGE FOR IV PUSH (FOR BLOOD PRESSURE SUPPORT)
80.0000 ug | PREFILLED_SYRINGE | INTRAVENOUS | Status: DC | PRN
  Filled 2015-09-01: qty 5
  Filled 2015-09-01: qty 10

## 2015-09-01 MED ORDER — PHENYLEPHRINE 40 MCG/ML (10ML) SYRINGE FOR IV PUSH (FOR BLOOD PRESSURE SUPPORT)
80.0000 ug | PREFILLED_SYRINGE | INTRAVENOUS | Status: DC | PRN
  Administered 2015-09-01: 80 ug via INTRAVENOUS
  Filled 2015-09-01: qty 5

## 2015-09-01 MED ORDER — TERBUTALINE SULFATE 1 MG/ML IJ SOLN
0.2500 mg | Freq: Once | INTRAMUSCULAR | Status: DC | PRN
  Filled 2015-09-01: qty 1

## 2015-09-01 MED ORDER — LACTATED RINGERS IV SOLN
INTRAVENOUS | Status: DC
  Administered 2015-09-01 (×3): 125 mL/h via INTRAVENOUS

## 2015-09-01 MED ORDER — BUTORPHANOL TARTRATE 1 MG/ML IJ SOLN
1.0000 mg | Freq: Once | INTRAMUSCULAR | Status: AC
Start: 2015-09-01 — End: 2015-09-01
  Administered 2015-09-01: 1 mg via INTRAVENOUS
  Filled 2015-09-01: qty 1

## 2015-09-01 MED ORDER — OXYTOCIN 40 UNITS IN LACTATED RINGERS INFUSION - SIMPLE MED
1.0000 m[IU]/min | INTRAVENOUS | Status: DC
  Administered 2015-09-01: 2 m[IU]/min via INTRAVENOUS
  Filled 2015-09-01: qty 1000

## 2015-09-01 MED ORDER — FENTANYL 2.5 MCG/ML BUPIVACAINE 1/10 % EPIDURAL INFUSION (WH - ANES)
14.0000 mL/h | INTRAMUSCULAR | Status: DC | PRN
  Administered 2015-09-01: 14 mL/h via EPIDURAL
  Filled 2015-09-01 (×2): qty 125

## 2015-09-01 MED ORDER — LIDOCAINE HCL (PF) 1 % IJ SOLN
30.0000 mL | INTRAMUSCULAR | Status: DC | PRN
  Filled 2015-09-01: qty 30

## 2015-09-01 MED ORDER — OXYCODONE-ACETAMINOPHEN 5-325 MG PO TABS
1.0000 | ORAL_TABLET | ORAL | Status: DC | PRN
Start: 2015-09-01 — End: 2015-09-02

## 2015-09-01 MED ORDER — LACTATED RINGERS IV SOLN: 500.0000 mL | Freq: Once | INTRAVENOUS | Status: DC

## 2015-09-01 MED ORDER — OXYTOCIN BOLUS FROM INFUSION: 500.0000 mL | INTRAVENOUS | Status: DC

## 2015-09-01 MED ORDER — ACETAMINOPHEN 325 MG PO TABS: 650.0000 mg | ORAL_TABLET | ORAL | Status: DC | PRN

## 2015-09-01 MED ORDER — OXYCODONE-ACETAMINOPHEN 5-325 MG PO TABS: 2.0000 | ORAL_TABLET | ORAL | Status: DC | PRN

## 2015-09-01 MED ORDER — SOD CITRATE-CITRIC ACID 500-334 MG/5ML PO SOLN: 30.0000 mL | ORAL | Status: DC | PRN

## 2015-09-01 MED ORDER — OXYTOCIN 40 UNITS IN LACTATED RINGERS INFUSION - SIMPLE MED
2.5000 [IU]/h | INTRAVENOUS | Status: DC
Start: 2015-09-01 — End: 2015-09-02

## 2015-09-01 NOTE — Anesthesia Preprocedure Evaluation (Signed)
Anesthesia Evaluation  Patient identified by MRN, date of birth, ID band Patient awake    Reviewed: Allergy & Precautions, NPO status , Patient's Chart, lab work & pertinent test results  History of Anesthesia Complications Negative for: history of anesthetic complications  Airway Mallampati: II  TM Distance: >3 FB Neck ROM: Full    Dental  (+) Teeth Intact, Dental Advisory Given   Pulmonary Current Smoker,    Pulmonary exam normal breath sounds clear to auscultation       Cardiovascular negative cardio ROS Normal cardiovascular exam Rhythm:Regular Rate:Normal     Neuro/Psych PSYCHIATRIC DISORDERS Depression negative neurological ROS     GI/Hepatic negative GI ROS, Neg liver ROS,   Endo/Other  negative endocrine ROS  Renal/GU negative Renal ROS     Musculoskeletal negative musculoskeletal ROS (+)   Abdominal   Peds  Hematology  (+) Blood dyscrasia, anemia , Plt 240k   Anesthesia Other Findings Day of surgery medications reviewed with the patient.  Reproductive/Obstetrics (+) Pregnancy G2P1                              Anesthesia Physical Anesthesia Plan  ASA: II  Anesthesia Plan: Epidural   Post-op Pain Management:    Induction:   Airway Management Planned:   Additional Equipment:   Intra-op Plan:   Post-operative Plan:   Informed Consent: I have reviewed the patients History and Physical, chart, labs and discussed the procedure including the risks, benefits and alternatives for the proposed anesthesia with the patient or authorized representative who has indicated his/her understanding and acceptance.   Dental advisory given  Plan Discussed with:   Anesthesia Plan Comments: (Patient identified. Risks/Benefits/Options discussed with patient including but not limited to bleeding, infection, nerve damage, paralysis, failed block, incomplete pain control, headache, blood  pressure changes, nausea, vomiting, reactions to medication both or allergic, itching and postpartum back pain. Confirmed with bedside nurse the patient's most recent platelet count. Confirmed with patient that they are not currently taking any anticoagulation, have any bleeding history or any family history of bleeding disorders. Patient expressed understanding and wished to proceed. All questions were answered. )        Anesthesia Quick Evaluation

## 2015-09-01 NOTE — H&P (Signed)
LABOR AND DELIVERY ADMISSION HISTORY AND PHYSICAL NOTE  Lisa Holt is a 25 y.o. female G2P1001 with IUP at [redacted]w[redacted]d by LMP presenting for ROM @ [phone] with clear fluid and positive ferning.   She reports positive fetal movement. She denies leakage of fluid or vaginal bleeding.  Prenatal History/Complications:  Past Medical History: Past Medical History  Diagnosis Date  . Post partum depression     was prescribed meds, took 2, then stopped    Past Surgical History: Past Surgical History  Procedure Laterality Date  . Wisdom tooth extraction      Obstetrical History: OB History    Gravida Para Term Preterm AB TAB SAB Ectopic Multiple Living   Social History: Social History   Social History  . Marital Status: Single    Spouse Name: N/A  . Number of Children: N/A  . Years of Education: N/A   Social History Main Topics  . Smoking status: Current Every Day Smoker -- 1.00 packs/day    Types: Cigarettes    Last Attempt to Quit: 06/05/2013  . Smokeless tobacco: Never Used  . Alcohol Use: No     Comment: occ- stopped with pregnancy  . Drug Use: No     Comment: las Cocaine use Nov. 2016, last marijuana 02/22/15  . Sexual Activity: No   Other Topics Concern  . None   Social History Narrative    Family History: Family History  Problem Relation Age of Onset  . Hypertension Father   . Diabetes Father   . Stroke Father     Allergies: Allergies  Allergen Reactions  . Latex Itching and Swelling    Prescriptions prior to admission  Medication Sig Dispense Refill Last Dose  . Prenatal Vit-Fe Fumarate-FA (PRENATAL VITAMIN) 27-0.8 MG TABS Take 1 tablet by mouth daily. 30 tablet 12 Past Week at Unknown time  . [DISCONTINUED] docusate sodium (COLACE) 100 MG capsule Take 1 capsule (100 mg total) by mouth 2 (two) times daily. (Patient not taking: Reported on 09/01/2015) 30 capsule 0   . [DISCONTINUED] hydrocortisone (ANUSOL-HC) 2.5 % rectal cream Place 1  application rectally 2 (two) times daily. (Patient not taking: Reported on 09/01/2015) 30 g 0   . [DISCONTINUED] polyethylene glycol (MIRALAX / GLYCOLAX) packet Take 17 g by mouth daily. (Patient not taking: Reported on 09/01/2015) 14 each 0      Review of Systems   All systems reviewed and negative except as stated in HPI  Blood pressure 97/66, pulse 96, temperature 97.7 F (36.5 C), temperature source Oral, resp. rate 18, weight 52.708 kg (116 lb 3.2 oz), last menstrual period 11/22/2014, unknown if currently breastfeeding. General appearance: alert, cooperative and no distress Lungs: clear to auscultation bilaterally Heart: regular rate and rhythm Abdomen: soft, non-tender; bowel sounds normal Extremities: No calf swelling or tenderness Presentation: cephalic on nurse exam Fetal monitoring: cat 1 Uterine activity: every 4-6 min  Dilation: 1.5 Effacement (%): Thick Station: -3 Exam by:: Dorrene German RN, L. Su Grand RN   Prenatal labs: ABO, Rh: AB/POS/-- (01/11 1604) Antibody: NEG (01/11 1604) Rubella: !Error! RPR: NON REAC (04/25 1543)  HBsAg: NEGATIVE (01/11 1604)  HIV: NONREACTIVE (04/25 1543)  GBS:    1 hr Glucola: 3rd tri passed Genetic screening:  WNL Anatomy US: normal female except isolated choroid plexus cysts    Clinic [facility] Prenatal Labs  Dating  LMP Blood type: AB/POS/-- (01/11 1604) AB +  Genetic Screen  AFP/Quad: wnl      Antibody:NEG (01/11 1604) negative  Anatomic US  wnl except isolated choroid plexus cysts  Rubella: 0.66 (01/11 1604) non-immune  GTT Early:               Third trimester: 52 RPR: NON REAC (04/25 1543) non reactive  Flu vaccine  off season HBsAg: NEGATIVE (01/11 1604) non reactive  TDaP vaccine   Given                                    Rhogam: N/A HIV: NONREACTIVE (04/25 1543) Negative  Baby Food    Breast                            GBS: Negative  Contraception    Maybe Nexplanon Pap: HSIL in January - needs colpo PP  Circumcision  declines    [facility]   Support Person  FOB - Leeanne RioAnthony Johnson       Prenatal Transfer Tool  Maternal Diabetes: No Genetic Screening: Normal Maternal Ultrasounds/Referrals: Normal Fetal Ultrasounds or other Referrals:  None Maternal Substance Abuse:  No Significant Maternal Medications:  None Significant Maternal Lab Results: None  Results for orders placed or performed during the hospital encounter of 09/01/15 (from the past 24 hour(s))  Fern Test   Collection Time: 09/01/15  8:59 AM  Result Value Ref Range   POCT Fern Test Positive = ruptured amniotic membanes     Patient Active Problem List   Diagnosis Date Noted  . HSIL (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 08/06/2015  . Insufficient prenatal care in third trimester 06/16/2015  . Drug use affecting pregnancy 03/10/2015  . Supervision of normal subsequent pregnancy 03/04/2015    Assessment: Pearla DubonnetSabrina N Emmanuel is a 25 y.o. G2P1001 at 616w3d here for ROM @ 0545  #Labor: will reeval progress in 2 hrs. If no progress, will do cytotec 50 oral #Pain: IV meds, will reeval #FWB: Cat 1 #ID:  GBS neg #MOF: breast #MOC:nexplanon #Circ:  no  Loni MuseKate Timberlake 09/01/2015, 9:09 AM   OB FELLOW HISTORY AND PHYSICAL ATTESTATION  I have seen and examined this patient; I agree with above documentation in the resident's note.    Cherrie Gauzeoah B Tamey Wanek 09/01/2015, 3:59 PM

## 2015-09-01 NOTE — Anesthesia Procedure Notes (Signed)
Epidural Patient location during procedure: OB  Staffing Anesthesiologist: TURK, STEPHEN EDWARD Performed by: anesthesiologist   Preanesthetic Checklist Completed: patient identified, pre-op evaluation, timeout performed, IV checked, risks and benefits discussed and monitors and equipment checked  Epidural Patient position: sitting Prep: DuraPrep Patient monitoring: blood pressure and continuous pulse ox Approach: midline Location: L3-L4 Injection technique: LOR air  Needle:  Needle type: Tuohy  Needle gauge: 17 G Needle length: 9 cm Needle insertion depth: 4 cm Catheter size: 19 Gauge Catheter at skin depth: 8 cm Test dose: negative and Other (1% Lidocaine)  Additional Notes Patient identified.  Risk benefits discussed including failed block, incomplete pain control, headache, nerve damage, paralysis, blood pressure changes, nausea, vomiting, reactions to medication both toxic or allergic, and postpartum back pain.  Patient expressed understanding and wished to proceed.  All questions were answered.  Sterile technique used throughout procedure and epidural site dressed with sterile barrier dressing. No paresthesia or other complications noted. The patient did not experience any signs of intravascular injection such as tinnitus or metallic taste in mouth nor signs of intrathecal spread such as rapid motor block. Please see nursing notes for vital signs. Reason for block:procedure for pain   

## 2015-09-01 NOTE — Progress Notes (Signed)
LABOR PROGRESS NOTE  Lisa DubonnetSabrina N Holt is a 25 y.o. G2P1001 at 2274w3d  admitted for PROM.  Subjective: Pt resting comfortably.  Objective: BP 104/72 mmHg  Pulse 70  Temp(Src) 98.4 F (36.9 C) (Oral)  Resp 16  Ht 5\' 3"  (1.6 m)  Wt 53.524 kg (118 lb)  BMI 20.91 kg/m2  LMP 11/22/2014 (Approximate) or  Filed Vitals:   09/01/15 1630 09/01/15 1700 09/01/15 1730 09/01/15 1800  BP: 97/62 92/55 84/47  104/72  Pulse: 80 88 73 70  Temp:      TempSrc:      Resp: 16 16 16 16   Height:      Weight:         Dilation: 3 Effacement (%): Thick Cervical Position: Posterior Station: -3 Presentation: Vertex Exam by:: MD Timberlake  Labs: Lab Results  Component Value Date   WBC 10.5 09/01/2015   HGB 10.2* 09/01/2015   HCT 30.1* 09/01/2015   MCV 84.6 09/01/2015   PLT 204 09/01/2015    Patient Active Problem List   Diagnosis Date Noted  . Normal labor 09/01/2015  . HSIL (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 08/06/2015  . Insufficient prenatal care in third trimester 06/16/2015  . Drug use affecting pregnancy 03/10/2015  . Supervision of normal subsequent pregnancy 03/04/2015    Assessment / Plan: 25 y.o. G2P1001 at 7974w3d here for PROM.  Labor: s/p pit Fetal Wellbeing:  Cat 1 Pain Control:  None right now Anticipated MOD:  SVD  Loni MuseKate Timberlake, MD 09/01/2015, 6:16 PM

## 2015-09-01 NOTE — Anesthesia Pain Management Evaluation Note (Signed)
  CRNA Pain Management Visit Note  Patient: Lisa Holt, 25 y.o., female  "Hello [facility] am [facility]. We have an anesthesia team available at all times to provide care throughout the hospital, including epidural management and anesthesia for C-section. I don't know your plan for the delivery whether it a natural birth, water birth, IV sedation, nitrous supplementation, doula or epidural, but we want to meet your pain goals."   1.Was your pain managed to your expectations on prior hospitalizations?     2.What is your expectation for pain management during this hospitalization?       3.How can we help you reach that goal?   Record the patient's initial score and the patient's pain goal.   Pain: 5  Pain Goal: 5 [facility] wants you to be able to say your pain was always managed very well.  Lisa Holt,Lisa Holt 09/01/2015

## 2015-09-01 NOTE — MAU Note (Signed)
Water broke at 0600, clear fluid- still coming.  No bleeding. Some contractions. Was 2 cm when last checked.

## 2015-09-01 NOTE — Progress Notes (Signed)
LABOR PROGRESS NOTE  Lisa Holt is a 25 y.o. G2P1001 at 7928w3d  admitted for PROM.  Subjective: Pt resting comfortably.  Objective: BP 92/50 mmHg  Pulse 73  Temp(Src) 98.3 F (36.8 C) (Oral)  Resp 16  Ht 5\' 3"  (1.6 m)  Wt 53.524 kg (118 lb)  BMI 20.91 kg/m2  LMP 11/22/2014 (Approximate) or  Filed Vitals:   09/01/15 0814 09/01/15 0957 09/01/15 1125 09/01/15 1250  BP: 97/66 98/62 114/87 92/50  Pulse: 96 82 87 73  Temp: 97.7 F (36.5 C) 97.5 F (36.4 C) 98.3 F (36.8 C) 98.3 F (36.8 C)  TempSrc: Oral Oral Oral Oral  Resp: 18 16 16 16   Height:  5\' 3"  (1.6 m)    Weight:  53.524 kg (118 lb)       Dilation: 2 Effacement (%): Thick Cervical Position: Posterior Station: -3 Presentation: Vertex Exam by:: MD Izzac Rockett  Labs: Lab Results  Component Value Date   WBC 10.5 09/01/2015   HGB 10.2* 09/01/2015   HCT 30.1* 09/01/2015   MCV 84.6 09/01/2015   PLT 204 09/01/2015    Patient Active Problem List   Diagnosis Date Noted  . Normal labor 09/01/2015  . HSIL (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 08/06/2015  . Insufficient prenatal care in third trimester 06/16/2015  . Drug use affecting pregnancy 03/10/2015  . Supervision of normal subsequent pregnancy 03/04/2015    Assessment / Plan: 25 y.o. G2P1001 at 4028w3d here for PROM@0545 .  Labor: s/p pit Fetal Wellbeing:  Cat 1 Pain Control:  IV meds  Anticipated MOD:  SVD  Lisa MuseKate Shantaya Bluestone, MD 09/01/2015, 1:18 PM

## 2015-09-02 ENCOUNTER — Encounter (HOSPITAL_COMMUNITY): Payer: Self-pay | Admitting: *Deleted

## 2015-09-02 DIAGNOSIS — Z3A40 40 weeks gestation of pregnancy: Secondary | ICD-10-CM

## 2015-09-02 LAB — RPR: RPR: NONREACTIVE

## 2015-09-02 MED ORDER — ONDANSETRON HCL 4 MG PO TABS: 4.0000 mg | ORAL_TABLET | ORAL | Status: DC | PRN

## 2015-09-02 MED ORDER — ZOLPIDEM TARTRATE 5 MG PO TABS: 5.0000 mg | ORAL_TABLET | Freq: Every evening | ORAL | Status: DC | PRN

## 2015-09-02 MED ORDER — VITAMIN K1 1 MG/0.5ML IJ SOLN
INTRAMUSCULAR | Status: AC
  Filled 2015-09-02: qty 0.5

## 2015-09-02 MED ORDER — PNEUMOCOCCAL VAC POLYVALENT 25 MCG/0.5ML IJ INJ
0.5000 mL | INJECTION | INTRAMUSCULAR | Status: AC
  Administered 2015-09-03: 0.5 mL via INTRAMUSCULAR
  Filled 2015-09-02 (×2): qty 0.5

## 2015-09-02 MED ORDER — DIPHENHYDRAMINE HCL 25 MG PO CAPS: 25.0000 mg | ORAL_CAPSULE | Freq: Four times a day (QID) | ORAL | Status: DC | PRN

## 2015-09-02 MED ORDER — SENNOSIDES-DOCUSATE SODIUM 8.6-50 MG PO TABS
2.0000 | ORAL_TABLET | ORAL | Status: DC
  Administered 2015-09-02: 2 via ORAL
  Filled 2015-09-02: qty 2

## 2015-09-02 MED ORDER — IBUPROFEN 600 MG PO TABS
600.0000 mg | ORAL_TABLET | Freq: Four times a day (QID) | ORAL | Status: DC
  Administered 2015-09-02 – 2015-09-03 (×6): 600 mg via ORAL
  Filled 2015-09-02 (×6): qty 1

## 2015-09-02 MED ORDER — BENZOCAINE-MENTHOL 20-0.5 % EX AERO
1.0000 | INHALATION_SPRAY | CUTANEOUS | Status: DC | PRN
Start: 2015-09-02 — End: 2015-09-03
  Administered 2015-09-02: 1 via TOPICAL
  Filled 2015-09-02: qty 56

## 2015-09-02 MED ORDER — MEASLES, MUMPS & RUBELLA VAC ~~LOC~~ INJ
0.5000 mL | INJECTION | Freq: Once | SUBCUTANEOUS | Status: AC
Start: 2015-09-03 — End: 2015-09-03
  Administered 2015-09-03: 0.5 mL via SUBCUTANEOUS
  Filled 2015-09-02 (×2): qty 0.5

## 2015-09-02 MED ORDER — SIMETHICONE 80 MG PO CHEW: 80.0000 mg | CHEWABLE_TABLET | ORAL | Status: DC | PRN

## 2015-09-02 MED ORDER — DIBUCAINE 1 % RE OINT: 1.0000 "application " | TOPICAL_OINTMENT | RECTAL | Status: DC | PRN

## 2015-09-02 MED ORDER — COCONUT OIL OIL: 1.0000 "application " | TOPICAL_OIL | Status: DC | PRN

## 2015-09-02 MED ORDER — PRENATAL MULTIVITAMIN CH
1.0000 | ORAL_TABLET | Freq: Every day | ORAL | Status: DC
  Administered 2015-09-02 – 2015-09-03 (×2): 1 via ORAL
  Filled 2015-09-02 (×2): qty 1

## 2015-09-02 MED ORDER — TETANUS-DIPHTH-ACELL PERTUSSIS 5-2.5-18.5 LF-MCG/0.5 IM SUSP: 0.5000 mL | Freq: Once | INTRAMUSCULAR | Status: DC

## 2015-09-02 MED ORDER — ACETAMINOPHEN 325 MG PO TABS
650.0000 mg | ORAL_TABLET | ORAL | Status: DC | PRN
  Administered 2015-09-02 (×4): 650 mg via ORAL
  Filled 2015-09-02 (×4): qty 2

## 2015-09-02 MED ORDER — WITCH HAZEL-GLYCERIN EX PADS: 1.0000 "application " | MEDICATED_PAD | CUTANEOUS | Status: DC | PRN

## 2015-09-02 MED ORDER — ONDANSETRON HCL 4 MG/2ML IJ SOLN: 4.0000 mg | INTRAMUSCULAR | Status: DC | PRN

## 2015-09-02 NOTE — Progress Notes (Signed)
UR chart review completed.  

## 2015-09-02 NOTE — Anesthesia Postprocedure Evaluation (Signed)
Anesthesia Post Note  Patient: Lisa DubonnetSabrina N Holt  Procedure(s) Performed: * No procedures listed *  Patient location during evaluation: Mother Baby Anesthesia Type: Epidural Level of consciousness: awake and alert Pain management: pain level controlled Vital Signs Assessment: post-procedure vital signs reviewed and stable Respiratory status: spontaneous breathing, nonlabored ventilation and respiratory function stable Cardiovascular status: stable Postop Assessment: no headache, no backache and epidural receding Anesthetic complications: no     Last Vitals:  Filed Vitals:   09/02/15 0415 09/02/15 0800  BP: 96/54 90/53  Pulse: 66 55  Temp: 36.6 C 36.5 C  Resp: 18 18    Last Pain:  Filed Vitals:   09/02/15 0839  PainSc: 5    Pain Goal: Patients Stated Pain Goal: 3 (09/02/15 0800)               Junious SilkGILBERT,Aren Pryde

## 2015-09-03 ENCOUNTER — Encounter: Payer: Medicaid Other | Admitting: Family Medicine

## 2015-09-03 MED ORDER — IBUPROFEN 600 MG PO TABS: 600.0000 mg | ORAL_TABLET | Freq: Four times a day (QID) | ORAL | Status: DC

## 2015-09-03 NOTE — Clinical Social Work Maternal (Signed)
CLINICAL SOCIAL WORK MATERNAL/CHILD NOTE  Patient Details  Name: Lisa Holt MRN: [id number] Date of Birth: 1990-09-26  Date:  09/03/2015  Clinical Social Worker Initiating Note:  Lisa Holt Date/ Time Initiated:  09/03/15/1206     Child's Name:  Lisa Holt   Legal Guardian:  Mother   Need for Interpreter:      Date of Referral:  09/03/15     Reason for Referral:  Behavioral Health Issues, including SI , Current Substance Use/Substance Use During Pregnancy    Referral Source:  Central Nursery   [address]  [address] [place] [address]  Phone number:  [phone]   Household Members:  Self, Significant Other, Minor Children, Other (Comment) (FOB's parents)   Natural Supports (not living in the home):  Extended Family, Immediate Family, Spouse/significant other   Professional Supports: Case Metallurgist   Employment:     Type of Work:     Education:  9 to 11 years   Pensions consultant:  [employer]   Other Resources:  [employer], Physicist, medical    Cultural/Religious Considerations Which May Impact Care:  Per [employer] Sheet, [name] is Non-Denominational  Strengths:  Ability to meet basic needs , Home prepared for child , Understanding of illness   Risk Factors/Current Problems:  Mental Health Concerns , Substance Use    Cognitive State:  Alert , Linear Thinking    Mood/Affect:  Anxious , Interested , Happy    CSW Assessment: CSW met with mother for a consult for hx of THC, "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  CSW completed an assessment, and offered [name] community supports. [name] was inviting, and was engaged during the visit. CSW inquired about [name] supports and living situation.  [name] communicated that she and FOB Lisa Holt) resides together.  [name] stated that she has a wealth of supports from her FOB, FOB's family, and [name]'s Friends. [name] communicated she has an estranged relationship with [name]'s parents because they do not approve of  [name]'s relationship with FOB.   [name] expressed that she and FOB are excited about their second baby, and that feel as they are well prepared for the baby.  [name] denied DV between [name] and FOB. CSW informed [name] of the hospital's drug screen policy, and informed [name] of the 2 screenings for the infant. [name] appeared understanding, and expressed that she actively utilized marijuana sporadically throughout [name]'s pregnancy to increase her appetite. CSW thanked [name] for being honest and shared with [name], that the infant's UDS was negative.  [name] denied any prior history of CPS, and was understanding of the hospital's policy. CSW offered [name] resources and referrals for SA, and [name] declined.  CSW has inquired about [name]'s hx of PPD and SI.  [name] acknowledged she has been depressed in the past, and communicated [name]'s depression was situational.  [name] reported her depressive symptoms was a result of not having a stable place to live with [name]'s oldest daughter. [name] denied any SI, and expressed [name] experienced symptms of depression over a year ago.  CSW offered [name] resources and referrals for [facility] counseling and [name] declined.   CSW educated [name] about PPD.  CSW informed [name] of possible supports and interventions to decrease PPD.  CSW also encouraged [name] to seek medical attention if needed for increased signs and symptoms for PPD. CSW also reviewed safe sleep, and SIDS. [name] was knowledgeable.  [name] communicated that she has a pack an play for the baby and all other necessary items.  CSW thanked [name] for her honesty, and willingness to meet with CSW.  [name] did not have any further questions, concerns, or needs at this time.     CSW Plan/Description:  Patient/Family Education , No Further Intervention Required/No Barriers to Discharge, Child Protective Service Report , Information/Referral to [employer]  (CSW will follow infant's cord)    Lisa Bessire D BOYD-GILYARD, LCSW 09/03/2015, 12:10 PM

## 2015-09-03 NOTE — Discharge Summary (Signed)
OB Discharge Summary     Patient Name: Lisa DubonnetSabrina N Holt DOB: [birth year] MRN: [id number]  Date of admission: 09/01/2015 Delivering MD: Shonna ChockWOUK, NOAH BEDFORD   Date of discharge: 09/03/2015  Admitting diagnosis: 40WKS,WATER BROKE Intrauterine pregnancy: 31100w4d     Secondary diagnosis:  Active Problems:   Normal labor  Additional problems: none     Discharge diagnosis: Term Pregnancy Delivered                                                                                                Post partum procedures:none  Augmentation: Pitocin  Complications: None  Hospital course:  PROM with IOL With Vaginal Delivery     25 y.o. yo [id number] at 70100w4d was admitted with PROM on 09/01/2015 at 0600. Patient had an uncomplicated labor course as follows:  Membrane Rupture Time/Date: 6:00 AM ,09/01/2015   Intrapartum Procedures: Episiotomy: None [1]                                         Lacerations:  Labial [10]  Patient had a delivery of a Viable infant. 09/02/2015  Information for the patient's newborn:  Lisa Holt [[id number]]  Delivery Method: Vaginal, Spontaneous Delivery (Filed from Delivery Summary)    Pateint had an uncomplicated postpartum course.  She is ambulating, tolerating a regular diet, passing flatus, and urinating well. Patient is discharged home in stable condition on 09/03/15 .    Physical exam  Filed Vitals:   09/02/15 0415 09/02/15 0800 09/02/15 1725 09/03/15 0620  BP: 96/54 90/53 92/57  89/50  Pulse: 66 55 77 53  Temp: 97.9 F (36.6 C) 97.7 F (36.5 C) 98.2 F (36.8 C) 98 F (36.7 C)  TempSrc: Oral Oral Oral Oral  Resp: 18 18 16 16   Height:      Weight:      SpO2: 95% 99%     General: alert and cooperative Lochia: appropriate Uterine Fundus: firm Incision: N/A DVT Evaluation: No evidence of DVT seen on physical exam. Labs: Lab Results  Component Value Date   WBC 10.5 09/01/2015   HGB 10.2* 09/01/2015   HCT 30.1* 09/01/2015   MCV 84.6  09/01/2015   PLT 204 09/01/2015   CMP Latest Ref Rng 01/10/2015  Glucose 65 - 99 mg/dL 130(Q107(H)  BUN 6 - 20 mg/dL 7  Creatinine 6.570.44 - 8.461.00 mg/dL 9.620.63  Sodium 952135 - 841145 mmol/L 136  Potassium 3.5 - 5.1 mmol/L 3.5  Chloride 101 - 111 mmol/L 105  CO2 22 - 32 mmol/L 23  Calcium 8.9 - 10.3 mg/dL 8.9  Total Protein 6.0 - 8.3 g/dL -  Total Bilirubin 0.3 - 1.2 mg/dL -  Alkaline Phos 39 - 324117 U/L -  AST 0 - 37 U/L -  ALT 0 - 35 U/L -    Discharge instruction: per After Visit Summary and "Baby and Me Booklet".  After visit meds:    Medication List    TAKE these medications  ibuprofen 600 MG tablet  Commonly known as:  ADVIL,MOTRIN  Take 1 tablet (600 mg total) by mouth every 6 (six) hours.     Prenatal Vitamin 27-0.8 MG Tabs  Take 1 tablet by mouth daily.        Diet: routine diet  Activity: Advance as tolerated. Pelvic rest for 6 weeks.   Outpatient follow up:6 weeks Follow up Appt:Future Appointments Date Time Provider Department Center  10/13/2015 8:20 AM Marny Lowenstein, PA-C [facility]   Follow up Visit:No Follow-up on file.  Postpartum contraception: Nexplanon  Newborn Data: Live born female  Birth Weight: 6 lb 15.6 oz (3164 g) APGAR: 8, 9  Baby Feeding: Bottle Disposition:home with mother   09/03/2015 Cam Hai, CNM

## 2015-09-03 NOTE — Discharge Instructions (Signed)

## 2015-09-25 NOTE — Congregational Nurse Program (Signed)
Congregational Nurse Program Note  Date of Encounter: 08/24/2015  Past Medical History: Past Medical History:  Diagnosis Date  . Post partum depression    was prescribed meds, took 2, then stopped    Encounter Details:     CNP Questionnaire - 08/27/15 0039      Patient Demographics   Is this a new or existing patient? New   Patient is considered a/an Not Applicable   Race African-American/Black     Patient Assistance   Location of Patient [facility]   Patient's financial/insurance status Medicaid   Uninsured Patient No   Patient referred to apply for the following financial assistance Not Applicable   Food insecurities addressed Not Applicable   Transportation assistance Yes   Type of Assistance Bus Pass Given   Assistance securing medications No   Educational health offerings Behavioral health;Health literacy;Other  client in need of baby items     Encounter Details   Primary purpose of visit Acute Illness/Condition Visit;Education/Health Concerns;Family/Caregiver Support;Spiritual Care/Support Visit   Was an Emergency Department visit averted? Not Applicable   Does patient have a medical provider? Yes   Patient referred to Other (comment)  clothing closet   Was a mental health screening completed? (GAINS tool) No   Does patient have dental issues? No   Does patient have vision issues? No   Does your patient have an abnormal blood pressure today? No   Since previous encounter, have you referred patient for abnormal blood pressure that resulted in a new diagnosis or medication change? No   Does your patient have an abnormal blood glucose today? No   Since previous encounter, have you referred patient for abnormal blood glucose that resulted in a new diagnosis or medication change? No   Was there a life-saving intervention made? No      Client  Pregnant.  To deliver soon.  Needs crib or pack and play and clothes.  Living with boyfriends family.    Acquired donation of swing for baby and will try to find crib or pack and play.,

## 2015-10-01 DIAGNOSIS — Z658 Other specified problems related to psychosocial circumstances: Secondary | ICD-10-CM

## 2015-10-09 NOTE — Congregational Nurse Program (Signed)
Congregational Nurse Program Note  Date of Encounter: 10/01/2015  Past Medical History: Past Medical History:  Diagnosis Date  . Post partum depression    was prescribed meds, took 2, then stopped    Encounter Details:     CNP Questionnaire - 10/01/15 0107      Patient Demographics   Is this a new or existing patient? Existing   Patient is considered a/an Not Applicable   Race African-American/Black     Patient Assistance   Patient's financial/insurance status Medicaid   Uninsured Patient No   Patient referred to apply for the following financial assistance Not Applicable   Food insecurities addressed Referred to food bank or resource   Transportation assistance No   Assistance securing medications No   Educational health offerings Behavioral health;Health literacy;Other  client in need of baby items     Encounter Details   Primary purpose of visit Education/Health Concerns;Family/Caregiver Support;Spiritual Care/Support Visit   Was an Emergency Department visit averted? Not Applicable   Does patient have a medical provider? Yes   Patient referred to Other (comment)  clothing closet   Was a mental health screening completed? (GAINS tool) No   Does patient have dental issues? No   Does patient have vision issues? No   Does your patient have an abnormal blood pressure today? No   Since previous encounter, have you referred patient for abnormal blood pressure that resulted in a new diagnosis or medication change? No   Does your patient have an abnormal blood glucose today? No   Since previous encounter, have you referred patient for abnormal blood glucose that resulted in a new diagnosis or medication change? No   Was there a life-saving intervention made? No      TC to client, she has found a house to rent and will be moving soon.  She is cleaning it now.  She is still in need of help with diapers and will meet me at the new house.  She and baby's father are not on good  terms, but he is being civil right now.  She wants to find work asap.  Had donor give money for diapers,, went by store and bought diapers for 2 yr old and new baby.  Client not there, but phone call to client and she had to leave.  Left diapers and she called me later and she had gotten them.  Will followup and try to find more resources for diapers.

## 2015-10-13 ENCOUNTER — Encounter: Payer: Self-pay | Admitting: Medical

## 2015-10-13 ENCOUNTER — Other Ambulatory Visit (HOSPITAL_COMMUNITY)
Admission: RE | Admit: 2015-10-13 | Discharge: 2015-10-13 | Disposition: A | Discharge status: Home / Self Care | Payer: Medicaid Other | Source: Ambulatory Visit | Attending: Family Medicine | Admitting: Family Medicine

## 2015-10-13 ENCOUNTER — Ambulatory Visit (INDEPENDENT_AMBULATORY_CARE_PROVIDER_SITE_OTHER): Payer: Medicaid Other | Admitting: Medical

## 2015-10-13 VITALS — BP 126/71 | HR 81 | Wt 104.3 lb

## 2015-10-13 DIAGNOSIS — Z30018 Encounter for initial prescription of other contraceptives: Secondary | ICD-10-CM | POA: Diagnosis not present

## 2015-10-13 DIAGNOSIS — Z3202 Encounter for pregnancy test, result negative: Secondary | ICD-10-CM

## 2015-10-13 DIAGNOSIS — R87613 High grade squamous intraepithelial lesion on cytologic smear of cervix (HGSIL): Secondary | ICD-10-CM | POA: Insufficient documentation

## 2015-10-13 DIAGNOSIS — Z30017 Encounter for initial prescription of implantable subdermal contraceptive: Secondary | ICD-10-CM | POA: Diagnosis present

## 2015-10-13 LAB — POCT PREGNANCY, URINE: Preg Test, Ur: NEGATIVE

## 2015-10-13 MED ORDER — ETONOGESTREL 68 MG ~~LOC~~ IMPL
68.0000 mg | DRUG_IMPLANT | Freq: Once | SUBCUTANEOUS | Status: AC
  Administered 2015-10-13: 68 mg via SUBCUTANEOUS

## 2015-10-13 NOTE — Progress Notes (Signed)
Subjective:     Lisa Holt is a 25 y.o. female who presents for a postpartum visit. She is 6 weeks postpartum following a spontaneous vaginal delivery. I have fully reviewed the prenatal and intrapartum course. The delivery was at 40 gestational weeks. Outcome: spontaneous vaginal delivery. Anesthesia: epidural. Postpartum course has been unremarkable. Baby's course has been unremarkable. Baby is feeding by bottle - Similac Advance. Bleeding staining only. Bowel function is normal. Bladder function is normal. Patient is sexually active. Contraception method is condoms. Postpartum depression screening: negative.  The following portions of the patient's history were reviewed and updated as appropriate: allergies, current medications, past family history, past medical history, past social history, past surgical history and problem list.  Review of Systems Pertinent items are noted in HPI.   Objective:    BP 126/71   Pulse 81   Wt 104 lb 4.8 oz (47.3 kg)   Breastfeeding? No   BMI 18.48 kg/m   General:  alert and cooperative   Breasts:  not performed  Lungs: clear to auscultation bilaterally  Heart:  regular rate and rhythm, S1, S2 normal, no murmur, click, rub or gallop  Abdomen: soft, non-tender; bowel sounds normal; no masses,  no organomegaly   Vulva:  normal  Vagina: normal vagina, no discharge, exudate, lesion, or erythema  Cervix:  see colpo note  Corpus: not examined  Adnexa:  not evaluated  Rectal Exam: Not performed.         GYNECOLOGY CLINIC COLPOSCOPY PROCEDURE NOTE  Ms. Lisa DubonnetSabrina N Strada is a 25 y.o. [phone] here for colposcopy for high-grade squamous intraepithelial neoplasia  (HGSIL-encompassing moderate and severe dysplasia) pap smear on 03/10/15. Discussed role for HPV in cervical dysplasia, need for surveillance.  Patient given informed consent, signed copy in the chart, time out was performed.  Placed in lithotomy position. Cervix viewed with speculum and colposcope  after application of acetic acid.   Colposcopy adequate? Yes  acetowhite lesion(s) noted at 12 o'clock and 4 o'clock and punctation noted at 12 o'clock; biopsies obtained.  ECC specimen obtained. All specimens were labelled and sent to pathology.   Patient was given post procedure instructions.  Will follow up pathology and manage accordingly.    GYNECOLOGY CLINIC PROCEDURE NOTE  Nexplanon Insertion Procedure Patient was given informed consent, she signed consent form.  Patient does understand that irregular bleeding is a very common side effect of this medication. She was advised to have backup contraception for one week after placement. Pregnancy test in clinic today was negative.  Appropriate time out taken.  Patient's left arm was prepped and draped in the usual sterile fashion.. The ruler used to measure and mark insertion area.  Patient was prepped with alcohol swab and then injected with 3 ml of 1% lidocaine.  She was prepped with betadine, Nexplanon removed from packaging,  Device confirmed in needle, then inserted full length of needle and withdrawn per handbook instructions. Nexplanon was able to palpated in the patient's arm; patient palpated the insert herself. There was minimal blood loss.  Patient insertion site covered with guaze and a pressure bandage to reduce any bruising.  The patient tolerated the procedure well and was given post procedure instructions.   Assessment:     Normal postpartum exam. Pap smear not done at today's visit. Patient had HSIL pap during pregnancy and missed Colpo appointments during pregnancy. Colpo performed today.   Plan:    1. Contraception: Nexplanon 2. Patient advised to take Ibuprofen and use heat/ice  for low back pain. Lifting recommendations also discussed. Follow-up with PCP if symptoms persist 3. Follow up depending on Pap results or sooner as needed.    Marny LowensteinJulie N Juda Toepfer, PA-C 10/13/2015 9:35 AM

## 2015-10-13 NOTE — Addendum Note (Signed)
Addended by: Sherre LainASH, Michalla Ringer A on: 10/13/2015 05:06 PM   Modules accepted: Orders

## 2015-10-13 NOTE — Patient Instructions (Signed)
HPV Test The human papillomavirus (HPV) test is used to look for high-risk types of HPV infection. HPV is a group of about 100 viruses. Many of these viruses cause growths on, in, or around the genitals. Most HPV viruses cause infections that usually go away without treatment. However, HPV types 6, 11, 16, and 18 are considered high-risk types of HPV that can increase your risk of cancer of the cervix or anus if the infection is left untreated. An HPV test identifies the DNA (genetic) strands of the HPV infection, so it is also referred to as the HPV DNA test. Although HPV is found in both males and females, the HPV test is only used to screen for increased cancer risk in females:  With an abnormal Pap test.  After treatment of an abnormal Pap test.  Between the ages of 5530 and 765.  After treatment of a high-risk HPV infection. The HPV test may be done at the same time as a pelvic exam and Pap test in females over the age of 23. Both the HPV test and Pap test require a sample of cells from the cervix. PREPARATION FOR TEST  Do not douche or take a bath for 24-48 hours before the test or as directed by your health care provider.  Do not have sex for 24-48 hours before the test or as directed by your health care provider.  You may be asked to reschedule the test if you are menstruating.  You will be asked to urinate before the test. RESULTS It is your responsibility to obtain your test results. Ask the lab or department performing the test when and how you will get your results. Talk with your health care provider if you have any questions about your results. Your result will be negative or positive. Meaning of Negative Test Results A negative HPV test result means that no HPV was found, and it is very likely that you do not have HPV. Meaning of Positive Test Results A positive HPV test result indicates that you have HPV.  If your test result shows the presence of any high-risk HPV strains,  you may have an increased risk of developing cancer of the cervix or anus if the infection is left untreated.  If any low-risk HPV strains are found, you are not likely to have an increased risk of cancer. Discuss your test results with your health care provider. He or she will use the results to make a diagnosis and determine a treatment plan that is right for you.   This information is not intended to replace advice given to you by your health care provider. Make sure you discuss any questions you have with your health care provider.   Document Released: 03/04/2004 Document Revised: 02/28/2014 Document Reviewed: 06/25/2013 Elsevier Interactive Patient Education 2016 [employer] Etonogestrel implant What is this medicine? ETONOGESTREL (et oh noe JES trel) is a contraceptive (birth control) device. It is used to prevent pregnancy. It can be used for up to 3 years. This medicine may be used for other purposes; ask your health care provider or pharmacist if you have questions. What should I tell my health care provider before I take this medicine? They need to know if you have any of these conditions: -abnormal vaginal bleeding -blood vessel disease or blood clots -cancer of the breast, cervix, or liver -depression -diabetes -gallbladder disease -headaches -heart disease or recent heart attack -high blood pressure -high cholesterol -kidney disease -liver disease -renal disease -seizures -tobacco smoker -  an unusual or allergic reaction to etonogestrel, other hormones, anesthetics or antiseptics, medicines, foods, dyes, or preservatives -pregnant or trying to get pregnant -breast-feeding How should I use this medicine? This device is inserted just under the skin on the inner side of your upper arm by a health care professional. Talk to your pediatrician regarding the use of this medicine in children. Special care may be needed. Overdosage: If you think you have taken too much of  this medicine contact a poison control center or emergency room at once. NOTE: This medicine is only for you. Do not share this medicine with others. What if I miss a dose? This does not apply. What may interact with this medicine? Do not take this medicine with any of the following medications: -amprenavir -bosentan -fosamprenavir This medicine may also interact with the following medications: -barbiturate medicines for inducing sleep or treating seizures -certain medicines for fungal infections like ketoconazole and itraconazole -griseofulvin -medicines to treat seizures like carbamazepine, felbamate, oxcarbazepine, phenytoin, topiramate -modafinil -phenylbutazone -rifampin -some medicines to treat HIV infection like atazanavir, indinavir, lopinavir, nelfinavir, tipranavir, ritonavir -St. John's wort This list may not describe all possible interactions. Give your health care provider a list of all the medicines, herbs, non-prescription drugs, or dietary supplements you use. Also tell them if you smoke, drink alcohol, or use illegal drugs. Some items may interact with your medicine. What should I watch for while using this medicine? This product does not protect you against HIV infection (AIDS) or other sexually transmitted diseases. You should be able to feel the implant by pressing your fingertips over the skin where it was inserted. Contact your doctor if you cannot feel the implant, and use a non-hormonal birth control method (such as condoms) until your doctor confirms that the implant is in place. If you feel that the implant may have broken or become bent while in your arm, contact your healthcare provider. What side effects may I notice from receiving this medicine? Side effects that you should report to your doctor or health care professional as soon as possible: -allergic reactions like skin rash, itching or hives, swelling of the face, lips, or tongue -breast lumps -changes in  emotions or moods -depressed mood -heavy or prolonged menstrual bleeding -pain, irritation, swelling, or bruising at the insertion site -scar at site of insertion -signs of infection at the insertion site such as fever, and skin redness, pain or discharge -signs of pregnancy -signs and symptoms of a blood clot such as breathing problems; changes in vision; chest pain; severe, sudden headache; pain, swelling, warmth in the leg; trouble speaking; sudden numbness or weakness of the face, arm or leg -signs and symptoms of liver injury like dark yellow or brown urine; general ill feeling or flu-like symptoms; light-colored stools; loss of appetite; nausea; right upper belly pain; unusually weak or tired; yellowing of the eyes or skin -unusual vaginal bleeding, discharge -signs and symptoms of a stroke like changes in vision; confusion; trouble speaking or understanding; severe headaches; sudden numbness or weakness of the face, arm or leg; trouble walking; dizziness; loss of balance or coordination Side effects that usually do not require medical attention (Report these to your doctor or health care professional if they continue or are bothersome.): -acne -back pain -breast pain -changes in weight -dizziness -general ill feeling or flu-like symptoms -headache -irregular menstrual bleeding -nausea -sore throat -vaginal irritation or inflammation This list may not describe all possible side effects. Call your doctor for medical advice about side  effects. You may report side effects to FDA at 1-800-FDA-1088. Where should I keep my medicine? This drug is given in a hospital or clinic and will not be stored at home. NOTE: This sheet is a summary. It may not cover all possible information. If you have questions about this medicine, talk to your doctor, pharmacist, or health care provider.    2016, Elsevier/Gold Standard. (2013-11-22 14:07:06)

## 2015-10-16 ENCOUNTER — Telehealth: Payer: Self-pay | Admitting: General Practice

## 2015-10-16 ENCOUNTER — Encounter: Payer: Self-pay | Admitting: General Practice

## 2015-10-16 NOTE — Telephone Encounter (Signed)
Per Raynelle FanningJulie, colposcopy was negative- needs a pap and cotesting in 6 months. Called patient, no answer- left message to call us back for non urgent results. Will send letter

## 2015-10-23 DIAGNOSIS — Z719 Counseling, unspecified: Secondary | ICD-10-CM

## 2015-10-25 DIAGNOSIS — Z658 Other specified problems related to psychosocial circumstances: Secondary | ICD-10-CM

## 2015-10-25 NOTE — Congregational Nurse Program (Signed)
Congregational Nurse Program Note  Date of Encounter: 10/25/2015  Past Medical History: Past Medical History:  Diagnosis Date  . Post partum depression    was prescribed meds, took 2, then stopped    Encounter Details:     CNP Questionnaire - 10/25/15 2335      Patient Demographics   Is this a new or existing patient? Existing   Patient is considered a/an Not Applicable   Race African-American/Black     Patient Assistance   Location of [facility]   Patient's financial/insurance status Medicaid   Uninsured Patient No   Patient referred to apply for the following financial assistance Medicaid   Food insecurities addressed Referred to food bank or resource   Transportation assistance No   Assistance securing medications No   Educational Designer, television/film sethealth offerings Behavioral health;Health literacy;Other;Spiritual care;Safety     Encounter Details   Primary purpose of visit Education/Health Concerns;Family/Caregiver Support;Spiritual Care/Support Visit;Safety   Was an Emergency Department visit averted? Not Applicable   Does patient have a medical provider? Yes   Patient referred to Area Agency   Was a mental health screening completed? (GAINS tool) No   Does patient have dental issues? No   Does patient have vision issues? No   Does your patient have an abnormal blood pressure today? No   Since previous encounter, have you referred patient for abnormal blood pressure that resulted in a new diagnosis or medication change? No   Does your patient have an abnormal blood glucose today? No   Since previous encounter, have you referred patient for abnormal blood glucose that resulted in a new diagnosis or medication change? No   Was there a life-saving intervention made? No      Home visit today.  Client and boyfriend and 2 yr old present at home.  Empty house and need for furniture.  Client signed paperwork for [employer].  Brought stove, fridge and some  household items to client from church donations. Client list reviewed and added a few things to the list. Client reports new baby doing well.   Will followup as I am able to get aproval from [facility] .  Client will not move in for a couple of weeks.

## 2015-10-27 ENCOUNTER — Telehealth: Payer: Self-pay

## 2015-10-27 NOTE — Telephone Encounter (Signed)
TC to client on 10/22/15 to check on info for the referral [facility].  Two children (23 yr old), (1 and a half month), diaper sizes 4 and 1.  Needs crib, 2 baby gates and if possible wants a window unit a/c.  I will talk with her this weekend.

## 2015-10-29 DIAGNOSIS — Z658 Other specified problems related to psychosocial circumstances: Secondary | ICD-10-CM

## 2015-10-31 ENCOUNTER — Telehealth: Payer: Self-pay

## 2015-11-17 DIAGNOSIS — Z658 Other specified problems related to psychosocial circumstances: Secondary | ICD-10-CM

## 2015-11-20 DIAGNOSIS — Z658 Other specified problems related to psychosocial circumstances: Secondary | ICD-10-CM

## 2015-11-29 NOTE — Congregational Nurse Program (Signed)
Congregational Nurse Program Note  Date of Encounter: 10/23/2015  Past Medical History: Past Medical History:  Diagnosis Date  . Post partum depression    was prescribed meds, took 2, then stopped    Encounter Details:  Arranged for pick up of donated items for home, stove and fridge

## 2015-11-29 NOTE — Congregational Nurse Program (Signed)
Congregational Nurse Program Note  Date of Encounter: 10/29/2015  Past Medical History: Past Medical History:  Diagnosis Date  . Post partum depression    was prescribed meds, took 2, then stopped    Encounter Details: HV made to [place].  Delivered some items for clients home that were donated. We discussed her history of drug use.  Client wanted to assure me this was a one time incident and she has not used since.  whe was adamant that she is not using any drugs or that she has any plans to do so in the future.  Client appreciative of items donated.

## 2015-12-25 NOTE — Congregational Nurse Program (Signed)
Congregational Nurse Program Note  Date of Encounter: 11/17/2015  Past Medical History: Past Medical History:  Diagnosis Date  . Post partum depression    was prescribed meds, took 2, then stopped    Encounter Details:   HV made. Furniture delivered to home.  Boyfriend and helpers unloaded.  Family excited about furniture.  Boyfriend pleasant and supportive of client

## 2015-12-31 NOTE — Congregational Nurse Program (Signed)
Congregational Nurse Program Note  Date of Encounter: 11/25/2015  Past Medical History: Past Medical History:  Diagnosis Date  . Post partum depression    was prescribed meds, took 2, then stopped    Encounter Details:   HV to client to deliver donated items, she was excited.  Lots of household linens, crib mattress, baby clothes.  Client was very thankful for items.

## 2015-12-31 NOTE — Congregational Nurse Program (Signed)
Congregational Nurse Program Note  Date of Encounter: 12/19/2015  Past Medical History: Past Medical History:  Diagnosis Date  . Post partum depression    was prescribed meds, took 2, then stopped    Encounter Details:     CNP Questionnaire - 12/19/15 1535      Patient Demographics   Is this a new or existing patient? Existing   Patient is considered a/an Not Applicable   Race African-American/Black     Patient Assistance   [facility] of [facility]   Patient's financial/insurance status Medicaid   Uninsured Patient ([place] Card/Care Connects) No   Patient referred to apply for the following financial assistance Medicaid   Food insecurities addressed Referred to food bank or Marine [employer]resource   Transportation assistance No   Assistance securing medications No   Sales promotion account executiveducational health offerings Behavioral health;Health literacy;Other;Spiritual care;Safety     Encounter Details   Primary purpose of visit Education/Health Concerns;Family/Caregiver Support;Spiritual Care/Support Visit;Safety   Was an Emergency Department visit averted? Not Applicable   Does patient have a medical provider? Yes   Patient referred to Area Agency;Not Applicable   Was a mental health screening completed? (GAINS tool) No   Does patient have dental issues? No   Does patient have vision issues? No   Does your patient have an abnormal blood pressure today? No   Since previous encounter, have you referred patient for abnormal blood pressure that resulted in a new diagnosis or medication change? No   Does your patient have an abnormal blood glucose today? No   Since previous encounter, have you referred patient for abnormal blood glucose that resulted in a new diagnosis or medication change? No   Was there a life-saving intervention made? No    Picked up donations from donor and delivered to client

## 2015-12-31 NOTE — Congregational Nurse Program (Signed)
Congregational Nurse Program Note  Date of Encounter: 11/25/2015  Past Medical History: Past Medical History:  Diagnosis Date  . Post partum depression    was prescribed meds, took 2, then stopped    Encounter Details:   Picked up donations from [employer] for [employer].  Children are Chance Omere Johnson(09[birth year]02[birth year]15) and Elyce Paige barnett

## 2016-01-11 ENCOUNTER — Ambulatory Visit: Payer: Self-pay | Admitting: Obstetrics & Gynecology

## 2016-01-24 NOTE — Congregational Nurse Program (Signed)
Congregational Nurse Program Note  Date of Encounter: 01/24/2016  Past Medical History: Past Medical History:  Diagnosis Date  . Post partum depression    was prescribed meds, took 2, then stopped    Encounter Details:     CNP Questionnaire - 01/24/16 0021      Patient Demographics   Is this a new or existing patient? Existing   Patient is considered a/an Not Applicable   Race African-American/Black     Patient Assistance   [facility] of [facility]   Patient's financial/insurance status Medicaid   Uninsured Patient ([place] Card/Care Connects) No   Patient referred to apply for the following financial assistance Medicaid   Food insecurities addressed Referred to food bank or Marine scientistresource   Transportation assistance No   Assistance securing medications No   Sales promotion account executiveducational health offerings Behavioral health;Health literacy;Other;Spiritual care;Safety     Encounter Details   Primary purpose of visit Education/Health Concerns;Family/Caregiver Support;Spiritual Care/Support Visit;Safety   Was an Emergency Department visit averted? Not Applicable   Does patient have a medical provider? Yes   Patient referred to Area Agency;Not Applicable   Was a mental health screening completed? (GAINS tool) No   Does patient have dental issues? No   Does patient have vision issues? No   Does your patient have an abnormal blood pressure today? No   Since previous encounter, have you referred patient for abnormal blood pressure that resulted in a new diagnosis or medication change? No   Does your patient have an abnormal blood glucose today? No   Since previous encounter, have you referred patient for abnormal blood glucose that resulted in a new diagnosis or medication change? No   Was there a life-saving intervention made? No     TC to arrange visit. HV with client.  Client has not moved into the house yet.  Brought her donated blankets, curtains, and clothing .  Her  boyfriend has not worked last week due to ride not showing up.  Stressed over this.  Has not worked in a couple of days.  Boyfriend's mom is wanting them to move out asap.  They need groceries as food stamps have been delayed..  She really needs a job to help with expenses and she is worried about christmas with the kids.  TC to [employer], [facility].  She and Martie LeeSabrina talked about a training class beginning Tues.  For [employer].  Martie LeeSabrina will attend informational class and begin 12 week training on Dec .  11th.  She will be paid for training.  Tried to encourage her to keep pushing and try to keep spirits up.  Will try to work with her on child care.

## 2016-01-29 NOTE — Congregational Nurse Program (Signed)
Congregational Nurse Program Note  Date of Encounter: 12/30/2015  Past Medical History: Past Medical History:  Diagnosis Date  . Post partum depression    was prescribed meds, took 2, then stopped    Encounter Details:   HV to client, working with her to gather furniture and household supplies .  Donations have been given to family to help with getting started housekeeping.  Discussed difficulties and challenges she is facing.  Will continue following and finding resources.

## 2016-01-30 NOTE — Congregational Nurse Program (Signed)
HV to deliver household items donated  for client.  She is working on the house as the landlord is fixing up areas that need to be completed.  They have painted and worked on the floors.

## 2016-01-30 NOTE — Congregational Nurse Program (Signed)
Congregational Nurse Program Note  Date of Encounter: 01/11/2016  Past Medical History: Past Medical History:  Diagnosis Date  . Post partum depression    was prescribed meds, took 2, then stopped    Encounter Details:     CNP Questionnaire - 01/24/16 0021      Patient Demographics   Is this a new or existing patient? Existing   Patient is considered a/an Not Applicable   Race African-American/Black     Patient Assistance   [facility]   Patient's financial/insurance status Medicaid   Uninsured Patient ([place] Card/Care Connects) No   Patient referred to apply for the following financial assistance Medicaid   Food insecurities addressed Referred to food bank or Marine scientistresource   Transportation assistance No   Assistance securing medications No   Sales promotion account executiveducational health offerings Behavioral health;Health literacy;Other;Spiritual care;Safety     Encounter Details   Primary purpose of visit Education/Health Concerns;Family/Caregiver Support;Spiritual Care/Support Visit;Safety   Was an Emergency Department visit averted? Not Applicable   Does patient have a medical provider? Yes   Patient referred to Area Agency;Not Applicable   Was a mental health screening completed? (GAINS tool) No   Does patient have dental issues? No   Does patient have vision issues? No   Does your patient have an abnormal blood pressure today? No   Since previous encounter, have you referred patient for abnormal blood pressure that resulted in a new diagnosis or medication change? No   Does your patient have an abnormal blood glucose today? No   Since previous encounter, have you referred patient for abnormal blood glucose that resulted in a new diagnosis or medication change? No   Was there a life-saving intervention made? No      HV to client,.  Donations have been given to family to help with getting started housekeeping.  Discussed difficulties and challenges she is  facing.  Will continue following and finding resources

## 2016-02-08 ENCOUNTER — Encounter (HOSPITAL_COMMUNITY): Payer: Self-pay

## 2016-02-08 ENCOUNTER — Emergency Department (HOSPITAL_COMMUNITY)
Admission: EM | Admit: 2016-02-08 | Discharge: 2016-02-08 | Disposition: A | Discharge status: Home / Self Care | Payer: Medicaid Other | Attending: Emergency Medicine | Admitting: Emergency Medicine

## 2016-02-08 DIAGNOSIS — N921 Excessive and frequent menstruation with irregular cycle: Secondary | ICD-10-CM | POA: Insufficient documentation

## 2016-02-08 DIAGNOSIS — R1032 Left lower quadrant pain: Secondary | ICD-10-CM | POA: Diagnosis present

## 2016-02-08 DIAGNOSIS — G8929 Other chronic pain: Secondary | ICD-10-CM | POA: Insufficient documentation

## 2016-02-08 DIAGNOSIS — R102 Pelvic and perineal pain: Secondary | ICD-10-CM | POA: Diagnosis not present

## 2016-02-08 DIAGNOSIS — F17210 Nicotine dependence, cigarettes, uncomplicated: Secondary | ICD-10-CM | POA: Diagnosis not present

## 2016-02-08 LAB — CBC
HCT: 38.1 % (ref 36.0–46.0)
HEMOGLOBIN: 12.5 g/dL (ref 12.0–15.0)
MCH: 27.5 pg (ref 26.0–34.0)
MCHC: 32.8 g/dL (ref 30.0–36.0)
MCV: 83.9 fL (ref 78.0–100.0)
PLATELETS: 181 10*3/uL (ref 150–400)
RBC: 4.54 MIL/uL (ref 3.87–5.11)
RDW: 15.3 % (ref 11.5–15.5)
WBC: 7.1 10*3/uL (ref 4.0–10.5)

## 2016-02-08 LAB — COMPREHENSIVE METABOLIC PANEL
ALK PHOS: 39 U/L (ref 38–126)
ALT: 16 U/L (ref 14–54)
ANION GAP: 6 (ref 5–15)
AST: 17 U/L (ref 15–41)
Albumin: 4.1 g/dL (ref 3.5–5.0)
BUN: 12 mg/dL (ref 6–20)
CALCIUM: 9.3 mg/dL (ref 8.9–10.3)
CO2: 25 mmol/L (ref 22–32)
CREATININE: 0.8 mg/dL (ref 0.44–1.00)
Chloride: 110 mmol/L (ref 101–111)
Glucose, Bld: 87 mg/dL (ref 65–99)
Potassium: 3.9 mmol/L (ref 3.5–5.1)
Sodium: 141 mmol/L (ref 135–145)
Total Bilirubin: 0.9 mg/dL (ref 0.3–1.2)
Total Protein: 6.6 g/dL (ref 6.5–8.1)

## 2016-02-08 LAB — LIPASE, BLOOD: Lipase: 29 U/L (ref 11–51)

## 2016-02-08 MED ORDER — NAPROXEN 500 MG PO TABS: 500.0000 mg | ORAL_TABLET | Freq: Two times a day (BID) | ORAL | 0 refills | Status: DC

## 2016-02-08 NOTE — ED Provider Notes (Signed)
[facility]-EMERGENCY DEPT Provider Note   CSN: [id number] Arrival date & time: 02/08/16  1347  By signing my name below, I, Arianna Nassar, attest that this documentation has been prepared under the direction and in the presence of Lyndal Pulleyaniel Naiomy Watters, MD.  Electronically Signed: Octavia HeirArianna Nassar, ED Scribe. 02/08/16. 5:35 PM.   History   Chief Complaint Chief Complaint  Patient presents with  . Abdominal Pain    The history is provided by the patient. No language interpreter was used.  Abdominal Pain   This is a recurrent problem. The current episode started more than 1 week ago. The problem occurs daily. The problem has been gradually worsening. The pain is associated with an unknown factor. The pain is located in the LLQ. The pain is moderate. Nothing aggravates the symptoms. Nothing relieves the symptoms.   HPI Comments: Lisa Holt is a 25 y.o. female who presents to the Emergency Department complaining of chronic, intermittent, unchanged, moderate, LLQ abdominal pain x 5 months. She reports intermittent vaginal bleeding. Pt says that her symptoms have been occurring since she gave birth to her son ~ 5 months ago. She has not taken any medication to relieve her pain. Pt has not had any new sexual partners. She denies vaginal discharge.  Past Medical History:  Diagnosis Date  . Post partum depression    was prescribed meds, took 2, then stopped    Patient Active Problem List   Diagnosis Date Noted  . HSIL (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 08/06/2015    Past Surgical History:  Procedure Laterality Date  . WISDOM TOOTH EXTRACTION      OB History    Gravida Para Term Preterm AB Living   2 2 2     2    SAB TAB Ectopic Multiple Live Births         0 2       Home Medications    Prior to Admission medications   Medication Sig Start Date End Date Taking? Authorizing Provider  ibuprofen (ADVIL,MOTRIN) 600 MG tablet Take 1 tablet (600 mg total) by  mouth every 6 (six) hours. Patient not taking: Reported on 10/13/2015 09/03/15   Arabella MerlesKimberly D Shaw, CNM  Prenatal Vit-Fe Fumarate-FA (PRENATAL VITAMIN) 27-0.8 MG TABS Take 1 tablet by mouth daily. Patient not taking: Reported on 10/13/2015 03/06/15   Catalina AntiguaPeggy Constant, MD    Family History Family History  Problem Relation Age of Onset  . Hypertension Father   . Diabetes Father   . Stroke Father     Social History Social History  Substance Use Topics  . Smoking status: Current Every Day Smoker    Packs/day: 1.00    Types: Cigarettes    Last attempt to quit: 06/05/2013  . Smokeless tobacco: Never Used  . Alcohol use No     Comment: occ- stopped with pregnancy     Allergies   Latex   Review of Systems Review of Systems  Gastrointestinal: Positive for abdominal pain.  Genitourinary: Positive for vaginal bleeding. Negative for vaginal discharge.  All other systems reviewed and are negative.    Physical Exam Updated Vital Signs BP 90/58 (BP Location: Left Arm)   Pulse 84   Temp 98.3 F (36.8 C) (Oral)   Resp 16   SpO2 99%   Physical Exam  Constitutional: She is oriented to person, place, and time. She appears well-developed and well-nourished.  HENT:  Head: Normocephalic.  Eyes: EOM are normal.  Neck: Normal range  of motion.  Cardiovascular: Normal rate and regular rhythm.   Pulmonary/Chest: Effort normal.  Abdominal: She exhibits no distension. There is no tenderness. There is no rebound and no guarding.  Musculoskeletal: Normal range of motion.  Neurological: She is alert and oriented to person, place, and time.  Skin: Skin is warm and dry.  Psychiatric: She has a normal mood and affect.  Nursing note and vitals reviewed.    ED Treatments / Results  DIAGNOSTIC STUDIES: Oxygen Saturation is 99% on RA, normal by my interpretation.  COORDINATION OF CARE:  5:32 PM Discussed treatment plan with pt at bedside and pt agreed to plan.  Labs (all labs ordered are  listed, but only abnormal results are displayed) Labs Reviewed  LIPASE, BLOOD  COMPREHENSIVE METABOLIC PANEL  CBC    EKG  EKG Interpretation None       Radiology No results found.  Procedures Procedures (including critical care time)  Medications Ordered in ED Medications - No data to display   Initial Impression / Assessment and Plan / ED Course  I have reviewed the triage vital signs and the nursing notes.  Pertinent labs & imaging results that were available during my care of the patient were reviewed by me and considered in my medical decision making (see chart for details).  Clinical Course     25 y.o. female presents with 5 months of pelvic pain with intermittent vaginal bleeding and irregular cycle. No anemia here, no significant tenderness on exam. Needs f/u with gynecology for appropriate care. Plan to follow up with PCP as needed and return precautions discussed for worsening or new concerning symptoms.   Final Clinical Impressions(s) / ED Diagnoses   Final diagnoses:  Chronic pelvic pain in female  Menometrorrhagia   I personally performed the services described in this documentation, which was scribed in my presence. The recorded information has been reviewed and is accurate.   New Prescriptions Discharge Medication List as of 02/08/2016  5:37 PM    START taking these medications   Details  naproxen (NAPROSYN) 500 MG tablet Take 1 tablet (500 mg total) by mouth 2 (two) times daily., Starting Mon 02/08/2016, Print         Lyndal Pulleyaniel Tyrann Donaho, MD 02/09/16 [phone]

## 2016-02-08 NOTE — ED Triage Notes (Signed)
Pt reports LLQ abd pain for several months. She reports intermittent vaginal bleeding since the birth of her son who is 675 months old. She is also reports left shoulder pain and left eye pain where she hit it on a cabinet.

## 2016-02-19 ENCOUNTER — Telehealth: Payer: Self-pay

## 2016-02-19 NOTE — Telephone Encounter (Signed)
TC from client.  Reports that her boyfriend's mother told her someone from [employer] called and told her that she was not to come back to work.  She called supervisor and she was not there.  TC to [employer], [employer] liaison, she was unaware of the situation.  TC to [name] and recommended she go in to work in a.m, and see what is communicated to her.  She is to call me in a.m.

## 2016-02-20 NOTE — Telephone Encounter (Signed)
TC from client, reports that she spoke to the supervisor.  She was "let go" "because she took a break to eat a donut and was unable to take a break later in the morning for a snack".  She feels as if she were treated unfairly.  She was crying, felt like she tried to do a good job.  They told her they would let her know something on Wednesday of next week as to whether she could continue the job.  I am not sure if she understands why she was suspended.  I made a call to [facility] and we are concerned that her performance has not met their standards.  I was concerned that the call to tell her not to come back to work went to her boyfriend's mother and the person(I don't know who)left a message.  Expressed that to Lisa Holt.  I asked Aseret to call me this weekend after she has calmed and encouraged her that we will continue to work to help her work on her goals.

## 2016-02-21 NOTE — Congregational Nurse Program (Signed)
Congregational Nurse Program Note  Date of Encounter: 01/25/2016  Past Medical History: Past Medical History:  Diagnosis Date  . Post partum depression    was prescribed meds, took 2, then stopped    Encounter Details:     CNP Questionnaire - 01/25/16 0103      Patient Demographics   Is this a new or existing patient? Existing   Patient is considered a/an Not Applicable   Race African-American/Black     Patient Assistance   [facility] of [facility]   Patient's financial/insurance status Medicaid   Uninsured Patient ([place] Card/Care Connects) No   Patient referred to apply for the following financial assistance Medicaid   Food insecurities addressed Referred to food bank or Marine scientistresource   Transportation assistance No   Assistance securing medications No   Sales promotion account executiveducational health offerings Behavioral health;Health literacy;Other;Spiritual care;Safety     Encounter Details   Primary purpose of visit Education/Health Concerns;Family/Caregiver Support;Spiritual Care/Support Visit;Safety   Was an Emergency Department visit averted? Not Applicable   Does patient have a medical provider? Yes   Patient referred to Area Agency;Not Applicable   Was a mental health screening completed? (GAINS tool) No   Does patient have dental issues? No   Does patient have vision issues? No   Does your patient have an abnormal blood pressure today? No   Since previous encounter, have you referred patient for abnormal blood pressure that resulted in a new diagnosis or medication change? No   Does your patient have an abnormal blood glucose today? No   Since previous encounter, have you referred patient for abnormal blood glucose that resulted in a new diagnosis or medication change? No   Was there a life-saving intervention made? No    HV with client, she had called me earlier this morning.  She reported her boyfriend's grandmother had passed away and that she was upset and so  was her boyfriend. She was crying and doesn't know how to help boyfriend.  Offered that sometimes just listening can be comforting.  Donor gave gift card for clothes for funeral for family.

## 2016-02-22 NOTE — Congregational Nurse Program (Signed)
Congregational Nurse Program Note  Date of Encounter: 01/28/2016  Past Medical History: Past Medical History:  Diagnosis Date  . Post partum depression    was prescribed meds, took 2, then stopped    Encounter Details:     CNP Questionnaire - 01/28/16 2220      Patient Demographics   Is this a new or existing patient? Existing   Patient is considered a/an Not Applicable   Race African-American/Black     Patient Assistance   [facility]   Patient's financial/insurance status Medicaid   Uninsured Patient ([place] Card/Care Connects) No   Patient referred to apply for the following financial assistance Medicaid   Food insecurities addressed Referred to food bank or Marine scientistresource   Transportation assistance No   Assistance securing medications No   Sales promotion account executiveducational health offerings Behavioral health;Other;Spiritual care     Encounter Details   Primary purpose of visit Education/Health Concerns;Family/Caregiver Support;Spiritual Care/Support Visit   Was an Emergency Department visit averted? Not Applicable   Does patient have a medical provider? Yes   Patient referred to Area Agency;Not Applicable   Was a mental health screening completed? (GAINS tool) No   Does patient have dental issues? No   Does patient have vision issues? No   Does your patient have an abnormal blood pressure today? No   Since previous encounter, have you referred patient for abnormal blood pressure that resulted in a new diagnosis or medication change? No   Does your patient have an abnormal blood glucose today? No   Since previous encounter, have you referred patient for abnormal blood glucose that resulted in a new diagnosis or medication change? No   Was there a life-saving intervention made? No      HV to client, to deliver clothing and household items donated for client.  Conference with Donata ClayWanda Ford and [employer] Ivey.  Arrangement to have Jackson meet with day care  resource to arrange help with daycare.  [facility] [employer] [facility] [employer] with help with bus passes.for the [employer] job.

## 2016-02-24 NOTE — Congregational Nurse Program (Signed)
Congregational Nurse Program Note  Date of Encounter: 02/04/2016  Past Medical History: Past Medical History:  Diagnosis Date  . Post partum depression    was prescribed meds, took 2, then stopped    Encounter Details:     CNP Questionnaire - 02/04/16 0126      Patient Demographics   Is this a new or existing patient? Existing   Patient is considered a/an Not Applicable   Race African-American/Black     Patient Assistance   [facility]   Patient's financial/insurance status Medicaid   Uninsured Patient ([place] Card/Care Connects) No   Patient referred to apply for the following financial assistance Medicaid   Food insecurities addressed Referred to food bank or Marine scientistresource   Transportation assistance No   Assistance securing medications No   Sales promotion account executiveducational health offerings Behavioral health;Other;Spiritual care     Encounter Details   Primary purpose of visit Education/Health Concerns;Family/Caregiver Support;Spiritual Care/Support Visit   Was an Emergency Department visit averted? Not Applicable   Does patient have a medical provider? Yes   Patient referred to Area Agency;Not Applicable   Was a mental health screening completed? (GAINS tool) No   Does patient have dental issues? No   Does patient have vision issues? No   Does your patient have an abnormal blood pressure today? No   Since previous encounter, have you referred patient for abnormal blood pressure that resulted in a new diagnosis or medication change? No   Does your patient have an abnormal blood glucose today? No   Since previous encounter, have you referred patient for abnormal blood glucose that resulted in a new diagnosis or medication change? No   Was there a life-saving intervention made? No      HV to client, she worked today and had a good day at work.  Smiling and feels good about situation.  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  Took those by dr office  for her.  She will pick up.  Having bleeding since baby born.  Having some pain now.  Also having pain in shoulder, not sure cause.  She may try to go to dr this weekend.

## 2016-02-25 NOTE — Congregational Nurse Program (Signed)
Congregational Nurse Program Note  Date of Encounter: 02/05/2016  Past Medical History: Past Medical History:  Diagnosis Date  . Post partum depression    was prescribed meds, took 2, then stopped    Encounter Details:     CNP Questionnaire - 02/05/16 2359      Patient Demographics   Is this a new or existing patient? Existing   Patient is considered a/an Not Applicable   Race African-American/Black     Patient Assistance   [facility]   Patient's financial/insurance status Medicaid   Uninsured Patient ([place] Card/Care Connects) No   Patient referred to apply for the following financial assistance Medicaid   Food insecurities addressed Referred to food bank or resource   Transportation assistance Yes   Type of Assistance Other   Assistance securing medications No   Educational Designer, television/film sethealth offerings Behavioral health;Other;Spiritual care     Encounter Details   Primary purpose of visit Education/Health Concerns;Family/Caregiver Support;Spiritual Care/Support Visit   Was an Emergency Department visit averted? Not Applicable   Does patient have a medical provider? Yes   Patient referred to Area Agency;Not Applicable   Was a mental health screening completed? (GAINS tool) No   Does patient have dental issues? No   Does patient have vision issues? No   Does your patient have an abnormal blood pressure today? No   Since previous encounter, have you referred patient for abnormal blood pressure that resulted in a new diagnosis or medication change? No   Does your patient have an abnormal blood glucose today? No   Since previous encounter, have you referred patient for abnormal blood glucose that resulted in a new diagnosis or medication change? No   Was there a life-saving intervention made? 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   She and boyfriend just got up.  He did not go to work today.  Missed daycare pickup.  Client  to go in to work at 10 today.  Arranged for transportation to work for her and the children to daycare.  DJ, boyfriend, was going to get duplicate license.

## 2016-02-25 NOTE — Congregational Nurse Program (Signed)
Congregational Nurse Program Note  Date of Encounter: 02/07/2016  Past Medical History: Past Medical History:  Diagnosis Date  . Post partum depression    was prescribed meds, took 2, then stopped    Encounter Details:     CNP Questionnaire - 02/07/16 2238      Patient Demographics   Is this a new or existing patient? Existing   Patient is considered a/an Not Applicable   Race African-American/Black     Patient Assistance   [facility]   Patient's financial/insurance status Medicaid   Uninsured Patient ([place] Card/Care Connects) No   Patient referred to apply for the following financial assistance Medicaid   Food insecurities addressed Referred to food bank or resource   Transportation assistance Yes   Type of Assistance Other   Assistance securing medications No   Educational Designer, television/film sethealth offerings Behavioral health;Other;Spiritual care     Encounter Details   Primary purpose of visit Education/Health Concerns;Family/Caregiver Support;Spiritual Care/Support Visit   Was an Emergency Department visit averted? Not Applicable   Does patient have a medical provider? Yes   Patient referred to Area Agency;Not Applicable   Was a mental health screening completed? (GAINS tool) No   Does patient have dental issues? No   Does patient have vision issues? No   Does your patient have an abnormal blood pressure today? No   Since previous encounter, have you referred patient for abnormal blood pressure that resulted in a new diagnosis or medication change? No   Does your patient have an abnormal blood glucose today? No   Since previous encounter, have you referred patient for abnormal blood glucose that resulted in a new diagnosis or medication change? No   Was there a life-saving intervention made? 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  Client has a new phone for herself

## 2016-02-26 ENCOUNTER — Telehealth: Payer: Self-pay
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
## 2016-02-29 ENCOUNTER — Telehealth: Payer: Self-pay

## 2016-02-29 NOTE — Congregational Nurse Program (Signed)
Congregational Nurse Program Note  Date of Encounter: 02/17/2016  Past Medical History: Past Medical History:  Diagnosis Date  . Post partum depression    was prescribed meds, took 2, then stopped    Encounter Details:     CNP Questionnaire - 02/17/16 2300      Patient Demographics   Is this a new or existing patient? Existing   Patient is considered a/an Not Applicable   Race African-American/Black     Patient Assistance   [facility]   Patient's financial/insurance status Medicaid   Uninsured Patient ([place] Card/Care Connects) No   Patient referred to apply for the following financial assistance Medicaid   Food insecurities addressed Referred to food bank or resource;Not Applicable   Transportation assistance Yes   Type of Assistance Other   Assistance securing medications No   Educational health offerings Behavioral health;Other;Spiritual care     Encounter Details   Primary purpose of visit Education/Health Concerns;Family/Caregiver Support;Spiritual Care/Support Visit   Was an Emergency Department visit averted? Not Applicable   Does patient have a medical provider? Yes   Patient referred to Not Applicable   Was a mental health screening completed? (GAINS tool) No   Does patient have dental issues? No   Does patient have vision issues? No   Does your patient have an abnormal blood pressure today? No   Since previous encounter, have you referred patient for abnormal blood pressure that resulted in a new diagnosis or medication change? No   Does your patient have an abnormal blood glucose today? No   Since previous encounter, have you referred patient for abnormal blood glucose that resulted in a new diagnosis or medication change? No   Was there a life-saving intervention made? No     TC from Lisa Holt, She is at work today, but boyfriend is at home.  HV made and DJ and diapers delivered.  TC from Lisa Holt again, she needs more  bus [asses.  [facility].  Lisa Holt unable to give bus passes, but after explanation that she had to pickup children and use an extra pass.  Lisa Holt was able to assist her with help.

## 2016-02-29 NOTE — Telephone Encounter (Signed)
TC from Lisa Holt, Lisa Holt liaison who has talked with HR at [employer].  They want to met with Lisa Holt to discuss her coming back to [place].  Ms.  Lisa Holt will contact Lisa Holt and let her know that she is to meet with them at 2 pm and to come in on Tuesday.  I texted Lisa Holt and asked her to call Lisa Holt to talk about what she was doing to discuss plans for daycare and bus transportation.  TC from Ms. Lisa Holt, She had seen [place] and her mom in the breakroom there to see Lisa Holt.  Lisa Holt texted me back to tell me everything alright.

## 2016-04-07 ENCOUNTER — Encounter (HOSPITAL_COMMUNITY): Payer: Self-pay | Admitting: Emergency Medicine

## 2016-04-07 ENCOUNTER — Emergency Department (HOSPITAL_COMMUNITY)
Admission: EM | Admit: 2016-04-07 | Discharge: 2016-04-07 | Disposition: A | Discharge status: Home / Self Care | Payer: Medicaid Other | Attending: Emergency Medicine | Admitting: Emergency Medicine

## 2016-04-07 DIAGNOSIS — Z91040 Latex allergy status: Secondary | ICD-10-CM | POA: Diagnosis not present

## 2016-04-07 DIAGNOSIS — N939 Abnormal uterine and vaginal bleeding, unspecified: Secondary | ICD-10-CM | POA: Diagnosis present

## 2016-04-07 DIAGNOSIS — R102 Pelvic and perineal pain: Secondary | ICD-10-CM | POA: Diagnosis not present

## 2016-04-07 DIAGNOSIS — F17210 Nicotine dependence, cigarettes, uncomplicated: Secondary | ICD-10-CM | POA: Insufficient documentation

## 2016-04-07 DIAGNOSIS — Z79899 Other long term (current) drug therapy: Secondary | ICD-10-CM | POA: Diagnosis not present

## 2016-04-07 LAB — URINALYSIS, ROUTINE W REFLEX MICROSCOPIC
Bacteria, UA: NONE SEEN
Bilirubin Urine: NEGATIVE
Glucose, UA: NEGATIVE mg/dL
KETONES UR: NEGATIVE mg/dL
Leukocytes, UA: NEGATIVE
NITRITE: NEGATIVE
PH: 6 (ref 5.0–8.0)
Protein, ur: NEGATIVE mg/dL
RBC / HPF: NONE SEEN RBC/hpf (ref 0–5)
Specific Gravity, Urine: 1.025 (ref 1.005–1.030)

## 2016-04-07 LAB — CBC WITH DIFFERENTIAL/PLATELET
Basophils Absolute: 0 10*3/uL (ref 0.0–0.1)
Basophils Relative: 0 %
Eosinophils Absolute: 0.2 10*3/uL (ref 0.0–0.7)
Eosinophils Relative: 4 %
HEMATOCRIT: 33.5 % — AB (ref 36.0–46.0)
Hemoglobin: 11.2 g/dL — ABNORMAL LOW (ref 12.0–15.0)
LYMPHS PCT: 43 %
Lymphs Abs: 2.3 10*3/uL (ref 0.7–4.0)
MCH: 27.8 pg (ref 26.0–34.0)
MCHC: 33.4 g/dL (ref 30.0–36.0)
MCV: 83.1 fL (ref 78.0–100.0)
Monocytes Absolute: 0.3 10*3/uL (ref 0.1–1.0)
Monocytes Relative: 6 %
Neutro Abs: 2.4 10*3/uL (ref 1.7–7.7)
Neutrophils Relative %: 47 %
Platelets: 148 10*3/uL — ABNORMAL LOW (ref 150–400)
RBC: 4.03 MIL/uL (ref 3.87–5.11)
RDW: 15 % (ref 11.5–15.5)
WBC: 5.2 10*3/uL (ref 4.0–10.5)

## 2016-04-07 LAB — WET PREP, GENITAL
Sperm: NONE SEEN
Trich, Wet Prep: NONE SEEN
Yeast Wet Prep HPF POC: NONE SEEN

## 2016-04-07 LAB — I-STAT BETA HCG BLOOD, ED (MC, WL, AP ONLY): I-stat hCG, quantitative: <5 m[IU]/mL (ref <5)

## 2016-04-07 MED ORDER — NAPROXEN 500 MG PO TABS: 500.0000 mg | ORAL_TABLET | Freq: Two times a day (BID) | ORAL | 0 refills | Status: DC

## 2016-04-07 NOTE — Discharge Instructions (Signed)
Please follow with OB/GYN for further evaluation and treatment of your cramping and bleeding. Return if worsening symptoms. Take naproxen as prescribed for pain and inflammation.

## 2016-04-07 NOTE — ED Provider Notes (Signed)
[facility]-EMERGENCY DEPT Provider Note   CSN: [id number] Arrival date & time: 04/07/16  1053     History   Chief Complaint Chief Complaint  Patient presents with  . Vaginal Bleeding  . Abdominal Pain    HPI Lisa Holt is a 26 y.o. female.  HPI Lisa Holt is a 26 y.o. female presents to emergency department complaining of vaginal bleeding and pelvic pain. Patient states her pain has been there for 7 months along with the almost daily bleeding. This started after giving birth to her son, and receiving and Implanon. She has not seen her OB/GYN for this. She states she went to the emergency department once and was told that she needed to follow-up with her OB/GYN but she has not had time. She is an emergency department with her children who are being seen for a different complaint and stated she wanted to be checked out as well. She denies any new symptoms. She denies any urinary symptoms. She denies any fever or chills. No nausea or vomiting. Denies any abnormal vaginal discharge. States nothing is making her symptoms better or worse. She has taken ibuprofen which has not helped.  Past Medical History:  Diagnosis Date  . Post partum depression    was prescribed meds, took 2, then stopped    Patient Active Problem List   Diagnosis Date Noted  . HSIL (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 08/06/2015    Past Surgical History:  Procedure Laterality Date  . WISDOM TOOTH EXTRACTION      OB History    Gravida Para Term Preterm AB Living   2 2 2     2    SAB TAB Ectopic Multiple Live Births         0 2       Home Medications    Prior to Admission medications   Medication Sig Start Date End Date Taking? Authorizing Provider  naproxen (NAPROSYN) 500 MG tablet Take 1 tablet (500 mg total) by mouth 2 (two) times daily. 02/08/16   Lyndal Pulley, MD  Prenatal Vit-Fe Fumarate-FA (PRENATAL VITAMIN) 27-0.8 MG TABS Take 1 tablet by mouth daily. Patient  not taking: Reported on 10/13/2015 03/06/15   Catalina Antigua, MD    Family History Family History  Problem Relation Age of Onset  . Hypertension Father   . Diabetes Father   . Stroke Father     Social History Social History  Substance Use Topics  . Smoking status: Current Every Day Smoker    Packs/day: 1.00    Types: Cigarettes    Last attempt to quit: 06/05/2013  . Smokeless tobacco: Never Used  . Alcohol use No     Comment: occ- stopped with pregnancy     Allergies   Latex   Review of Systems Review of Systems  Constitutional: Negative for chills and fever.  Respiratory: Negative for cough, chest tightness and shortness of breath.   Cardiovascular: Negative for chest pain, palpitations and leg swelling.  Gastrointestinal: Positive for abdominal pain. Negative for diarrhea, nausea and vomiting.  Genitourinary: Positive for pelvic pain and vaginal bleeding. Negative for dysuria, flank pain, vaginal discharge and vaginal pain.  Musculoskeletal: Negative for arthralgias, myalgias, neck pain and neck stiffness.  Skin: Negative for rash.  Neurological: Negative for dizziness, weakness and headaches.  All other systems reviewed and are negative.    Physical Exam Updated Vital Signs BP 97/71 (BP Location: Left Arm)   Pulse 66   Temp  98.5 F (36.9 C) (Oral)   Resp 16   Ht 5\' 3"  (1.6 m)   Wt 46.3 kg   SpO2 100%   BMI 18.07 kg/m   Physical Exam  Constitutional: She is oriented to person, place, and time. She appears well-developed and well-nourished. No distress.  HENT:  Head: Normocephalic.  Eyes: Conjunctivae are normal.  Neck: Neck supple.  Cardiovascular: Normal rate, regular rhythm and normal heart sounds.   Pulmonary/Chest: Effort normal and breath sounds normal. No respiratory distress. She has no wheezes. She has no rales.  Abdominal: Soft. Bowel sounds are normal. She exhibits no distension. There is tenderness. There is no rebound.  Genitourinary:    Genitourinary Comments: Normal external genitalia. Normal vaginal canal. Small thin white discharge. Cervix is normal, closed. No CMT. No uterine tenderness. Left adnexal tenderness. No masses palpated.    Musculoskeletal: She exhibits no edema.  Neurological: She is alert and oriented to person, place, and time.  Skin: Skin is warm and dry.  Psychiatric: She has a normal mood and affect. Her behavior is normal.  Nursing note and vitals reviewed.    ED Treatments / Results  Labs (all labs ordered are listed, but only abnormal results are displayed) Labs Reviewed  WET PREP, GENITAL - Abnormal; Notable for the following:       Result Value   Clue Cells Wet Prep HPF POC PRESENT (*)    WBC, Wet Prep HPF POC MANY (*)    All other components within normal limits  CBC WITH DIFFERENTIAL/PLATELET - Abnormal; Notable for the following:    Hemoglobin 11.2 (*)    HCT 33.5 (*)    Platelets 148 (*)    All other components within normal limits  URINALYSIS, ROUTINE W REFLEX MICROSCOPIC - Abnormal; Notable for the following:    Hgb urine dipstick MODERATE (*)    Squamous Epithelial / LPF 0-5 (*)    All other components within normal limits  I-STAT BETA HCG BLOOD, ED ([facility], [facility], [facility] ONLY)  GC/CHLAMYDIA PROBE AMP ([place]) NOT AT [facility]    EKG  EKG Interpretation None       Radiology No results found.  Procedures Procedures (including critical care time)  Medications Ordered in ED Medications - No data to display   Initial Impression / Assessment and Plan / ED Course  I have reviewed the triage vital signs and the nursing notes.  Pertinent labs & imaging results that were available during my care of the patient were reviewed by me and considered in my medical decision making (see chart for details).      Patient emergency department with vaginal bleeding for several months, with worsening left adnexal pain.symptoms after receiving Implanon. Will check hemoglobin, patient states  she has had some  Dizziness and lightheadedness. Will check pregnancy test, perform pelvic exam.  2:14 PM Hemoglobin 11.2, stable compared to the prior. Pt with left adnexal pain, exam and pelvic exam unremarkable other than tenderness in Left adnexa.pregnancy test is negative. I ordered ultrasound to rule out tubo-ovarian abscess versus ovarian cysts. When I went to inform patient of the plan, she stated she wanted to leave, because she had something going on today. She stated that she will make an appointment with her OB/GYN for further follow-up and will return to hospital if getting worse.  Final Clinical Impressions(s) / ED Diagnoses   Final diagnoses:  Adnexal pain  Vaginal bleeding    New Prescriptions Discharge Medication List as of 04/07/2016  2:16  PM    START taking these medications   Details  !! naproxen (NAPROSYN) 500 MG tablet Take 1 tablet (500 mg total) by mouth 2 (two) times daily., Starting Thu 04/07/2016, Print     !! - Potential duplicate medications found. Please discuss with provider.       Jaynie Crumbleatyana Lilly Gasser, PA-C 04/07/16 1528    Jacalyn LefevreJulie Haviland, MD 04/14/16 1330

## 2016-04-07 NOTE — ED Notes (Signed)
Bed: [id number] Expected date:  Expected time:  Means of arrival:  Comments: Triage, family

## 2016-04-07 NOTE — ED Triage Notes (Signed)
Pt reports frequent vaginal bleeding accompanied by LLQ pain. On birth control and is expected to have some frequent bleeding, but pain is not expected for her.

## 2016-04-08 LAB — GC/CHLAMYDIA PROBE AMP (~~LOC~~) NOT AT ARMC
Chlamydia: NEGATIVE
Neisseria Gonorrhea: NEGATIVE

## 2016-04-22 NOTE — Congregational Nurse Program (Signed)
Congregational Nurse Program Note  Date of Encounter: 03/28/2016  Past Medical History: Past Medical History:  Diagnosis Date  . Post partum depression    was prescribed meds, took 2, then stopped    Encounter Details:     CNP Questionnaire - 03/28/16 1824      Patient Demographics   Is this a new or existing patient? Existing   Patient is considered a/an Not Applicable   Race African-American/Black     Patient Assistance   [facility]   Patient's financial/insurance status Medicaid   Uninsured Patient ([place] Card/Care Connects) No   Patient referred to apply for the following financial assistance Medicaid   Food insecurities addressed Referred to food bank or resource;Not Applicable   Transportation assistance Yes   Assistance securing medications No   Educational health offerings Behavioral health;Other;Spiritual care     Encounter Details   Primary purpose of visit Education/Health Concerns;Family/Caregiver Support;Spiritual Care/Support Visit   Was an Emergency Department visit averted? Not Applicable   Does patient have a medical provider? Yes   Patient referred to Not Applicable   Was a mental health screening completed? (GAINS tool) No   Does patient have dental issues? No   Does patient have vision issues? No   Does your patient have an abnormal blood pressure today? No   Since previous encounter, have you referred patient for abnormal blood pressure that resulted in a new diagnosis or medication change? No   Does your patient have an abnormal blood glucose today? No   Since previous encounter, have you referred patient for abnormal blood glucose that resulted in a new diagnosis or medication change? No   Was there a life-saving intervention made? No     Conference with Mrs. Ford and [facility]bb [facility].  Discussed referring to Case manager.  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  Discussed last client contact.

## 2016-07-11 ENCOUNTER — Encounter (HOSPITAL_COMMUNITY): Payer: Self-pay | Admitting: Emergency Medicine

## 2016-07-11 ENCOUNTER — Emergency Department (HOSPITAL_COMMUNITY)
Admission: EM | Admit: 2016-07-11 | Discharge: 2016-07-11 | Disposition: A | Discharge status: Home / Self Care | Payer: Medicaid Other | Attending: Emergency Medicine | Admitting: Emergency Medicine

## 2016-07-11 DIAGNOSIS — Z91040 Latex allergy status: Secondary | ICD-10-CM | POA: Diagnosis not present

## 2016-07-11 DIAGNOSIS — F17210 Nicotine dependence, cigarettes, uncomplicated: Secondary | ICD-10-CM | POA: Diagnosis not present

## 2016-07-11 DIAGNOSIS — B349 Viral infection, unspecified: Secondary | ICD-10-CM | POA: Diagnosis not present

## 2016-07-11 DIAGNOSIS — M791 Myalgia: Secondary | ICD-10-CM | POA: Diagnosis present

## 2016-07-11 MED ORDER — ACETAMINOPHEN 325 MG PO TABS
650.0000 mg | ORAL_TABLET | Freq: Once | ORAL | Status: AC | PRN
  Administered 2016-07-11: 650 mg via ORAL
  Filled 2016-07-11: qty 2

## 2016-07-11 NOTE — ED Provider Notes (Signed)
[facility]-EMERGENCY DEPT Provider Note   CSN: [id number] Arrival date & time: 07/11/16  1043  By signing my name below, I, Sonum Patel, attest that this documentation has been prepared under the direction and in the presence of Benjiman Core, MD. Electronically Signed: Leone Payor, Scribe. 07/11/16. 1:44 PM.  History   Chief Complaint Chief Complaint  Patient presents with  . Generalized Body Aches    The history is provided by the patient. No language interpreter was used.     HPI Comments: Lisa Holt is a 26 y.o. female who presents to the Emergency Department complaining of a few days of generalized myalgias with associated lower back pain and nasal congestion. She notes mild nausea at time. She has not tried any home remedies or treatments for her symptoms. She denies sore throat, cough, rhinorrhea, dysuria, vomiting. She has sick contacts at home with similar symptoms.   Past Medical History:  Diagnosis Date  . Post partum depression    was prescribed meds, took 2, then stopped    Patient Active Problem List   Diagnosis Date Noted  . HSIL (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 08/06/2015    Past Surgical History:  Procedure Laterality Date  . WISDOM TOOTH EXTRACTION      OB History    Gravida Para Term Preterm AB Living   2 2 2     2    SAB TAB Ectopic Multiple Live Births         0 2       Home Medications    Prior to Admission medications   Medication Sig Start Date End Date Taking? Authorizing Provider  naproxen (NAPROSYN) 500 MG tablet Take 1 tablet (500 mg total) by mouth 2 (two) times daily. 02/08/16   Lyndal Pulley, MD  naproxen (NAPROSYN) 500 MG tablet Take 1 tablet (500 mg total) by mouth 2 (two) times daily. 04/07/16   Kirichenko, Lemont Fillers, PA-C  Prenatal Vit-Fe Fumarate-FA (PRENATAL VITAMIN) 27-0.8 MG TABS Take 1 tablet by mouth daily. Patient not taking: Reported on 10/13/2015 03/06/15   Constant, Peggy, MD    Family  History Family History  Problem Relation Age of Onset  . Hypertension Father   . Diabetes Father   . Stroke Father     Social History Social History  Substance Use Topics  . Smoking status: Current Every Day Smoker    Packs/day: 1.00    Types: Cigarettes    Last attempt to quit: 06/05/2013  . Smokeless tobacco: Never Used  . Alcohol use No     Comment: occ- stopped with pregnancy     Allergies   Latex   Review of Systems Review of Systems  Constitutional: Negative for fever.  HENT: Positive for congestion. Negative for rhinorrhea and sore throat.   Respiratory: Negative for cough.   Gastrointestinal: Positive for nausea. Negative for vomiting.  Genitourinary: Negative for dysuria.  Musculoskeletal: Positive for back pain and myalgias.     Physical Exam Updated Vital Signs BP 118/70 (BP Location: Left Arm)   Pulse 94   Temp 99.7 F (37.6 C) (Oral)   Resp 18   SpO2 100%   Physical Exam  Constitutional: She is oriented to person, place, and time. She appears well-developed and well-nourished. No distress.  Feels warm  HENT:  Head: Normocephalic and atraumatic.  Mouth/Throat: Posterior oropharyngeal erythema present.  Minimal posterior oropharyngeal erythema with nasal congestion.   Eyes: EOM are normal.  Neck: Normal range of motion.  Cardiovascular: Normal rate, regular rhythm and normal heart sounds.   Pulmonary/Chest: Effort normal.  Mild harsh breath sounds, worse on the left.   Abdominal: Soft. She exhibits no distension. There is no tenderness.  Musculoskeletal: Normal range of motion. She exhibits no tenderness.  No back or muscle tenderness.   Neurological: She is alert and oriented to person, place, and time.  Skin: Skin is warm and dry.  Psychiatric: She has a normal mood and affect. Judgment normal.  Nursing note and vitals reviewed.    ED Treatments / Results    COORDINATION OF CARE: 12:45 PM Discussed treatment plan with pt at bedside  and pt agreed to plan.   Labs (all labs ordered are listed, but only abnormal results are displayed) Labs Reviewed - No data to display  EKG  EKG Interpretation None       Radiology No results found.  Procedures Procedures (including critical care time)  Medications Ordered in ED Medications  acetaminophen (TYLENOL) tablet 650 mg (650 mg Oral Given 07/11/16 1215)     Initial Impression / Assessment and Plan / ED Course  I have reviewed the triage vital signs and the nursing notes.  Pertinent labs & imaging results that were available during my care of the patient were reviewed by me and considered in my medical decision making (see chart for details).     Patient with fevers chills and muscle aches. Had some nonspecific lung findings. Patient states she later she was feeling better and did not want  the x-ray. Will discharge home. Likely viral infection.  Final Clinical Impressions(s) / ED Diagnoses   Final diagnoses:  Viral illness    New Prescriptions New Prescriptions   No medications on file   I personally performed the services described in this documentation, which was scribed in my presence. The recorded information has been reviewed and is accurate.      Benjiman CorePickering, Terrisa Curfman, MD 07/11/16 1344

## 2016-07-11 NOTE — ED Triage Notes (Signed)
Pt sts body aches and nasal congestion; pt sts children have had same

## 2016-12-08 ENCOUNTER — Emergency Department (HOSPITAL_COMMUNITY)
Admission: EM | Admit: 2016-12-08 | Discharge: 2016-12-09 | Disposition: A | Discharge status: Home / Self Care | Payer: Medicaid Other | Attending: Emergency Medicine | Admitting: Emergency Medicine

## 2016-12-08 ENCOUNTER — Encounter (HOSPITAL_COMMUNITY): Payer: Self-pay

## 2016-12-08 DIAGNOSIS — J00 Acute nasopharyngitis [common cold]: Secondary | ICD-10-CM

## 2016-12-08 DIAGNOSIS — F17210 Nicotine dependence, cigarettes, uncomplicated: Secondary | ICD-10-CM | POA: Insufficient documentation

## 2016-12-08 DIAGNOSIS — R109 Unspecified abdominal pain: Secondary | ICD-10-CM | POA: Insufficient documentation

## 2016-12-08 DIAGNOSIS — M542 Cervicalgia: Secondary | ICD-10-CM | POA: Insufficient documentation

## 2016-12-08 DIAGNOSIS — M62838 Other muscle spasm: Secondary | ICD-10-CM | POA: Diagnosis not present

## 2016-12-08 DIAGNOSIS — R509 Fever, unspecified: Secondary | ICD-10-CM | POA: Diagnosis present

## 2016-12-08 DIAGNOSIS — Z91040 Latex allergy status: Secondary | ICD-10-CM | POA: Insufficient documentation

## 2016-12-08 DIAGNOSIS — G8929 Other chronic pain: Secondary | ICD-10-CM

## 2016-12-08 DIAGNOSIS — J069 Acute upper respiratory infection, unspecified: Secondary | ICD-10-CM | POA: Insufficient documentation

## 2016-12-08 DIAGNOSIS — Z79899 Other long term (current) drug therapy: Secondary | ICD-10-CM | POA: Insufficient documentation

## 2016-12-08 DIAGNOSIS — M25571 Pain in right ankle and joints of right foot: Secondary | ICD-10-CM | POA: Insufficient documentation

## 2016-12-08 DIAGNOSIS — M7918 Myalgia, other site: Secondary | ICD-10-CM

## 2016-12-08 LAB — CBC
HCT: 38 % (ref 36.0–46.0)
HEMOGLOBIN: 12.5 g/dL (ref 12.0–15.0)
MCH: 27.6 pg (ref 26.0–34.0)
MCHC: 32.9 g/dL (ref 30.0–36.0)
MCV: 83.9 fL (ref 78.0–100.0)
Platelets: 153 10*3/uL (ref 150–400)
RBC: 4.53 MIL/uL (ref 3.87–5.11)
RDW: 14.8 % (ref 11.5–15.5)
WBC: 9.1 10*3/uL (ref 4.0–10.5)

## 2016-12-08 LAB — I-STAT BETA HCG BLOOD, ED (MC, WL, AP ONLY)

## 2016-12-08 NOTE — ED Triage Notes (Signed)
Pt reports that she has pulled her back muscle on the right hand side, she is having neck pain and shoulder pain. Pt is also reporting that she is also having cold symptoms. Pt reports that she was working and she was lifting something when she pulled her shoulder and was lifting an AC out of a window when she injured her back. Pt also c/o abdominal pain with nausea.

## 2016-12-09 LAB — COMPREHENSIVE METABOLIC PANEL
ALBUMIN: 4.1 g/dL (ref 3.5–5.0)
ALK PHOS: 45 U/L (ref 38–126)
ALT: 13 U/L — AB (ref 14–54)
AST: 17 U/L (ref 15–41)
Anion gap: 7 (ref 5–15)
BUN: 11 mg/dL (ref 6–20)
CALCIUM: 9.4 mg/dL (ref 8.9–10.3)
CO2: 26 mmol/L (ref 22–32)
CREATININE: 0.74 mg/dL (ref 0.44–1.00)
Chloride: 104 mmol/L (ref 101–111)
GFR calc Af Amer: >60 mL/min (ref >60)
GFR calc non Af Amer: >60 mL/min (ref >60)
GLUCOSE: 93 mg/dL (ref 65–99)
Potassium: 3.3 mmol/L — ABNORMAL LOW (ref 3.5–5.1)
SODIUM: 137 mmol/L (ref 135–145)
Total Bilirubin: 0.6 mg/dL (ref 0.3–1.2)
Total Protein: 7.4 g/dL (ref 6.5–8.1)

## 2016-12-09 LAB — LIPASE, BLOOD: Lipase: 30 U/L (ref 11–51)

## 2016-12-09 MED ORDER — IBUPROFEN 200 MG PO TABS
600.0000 mg | ORAL_TABLET | Freq: Once | ORAL | Status: AC
  Administered 2016-12-09: 600 mg via ORAL
  Filled 2016-12-09: qty 3

## 2016-12-09 MED ORDER — CYCLOBENZAPRINE HCL 10 MG PO TABS: 10.0000 mg | ORAL_TABLET | Freq: Two times a day (BID) | ORAL | 0 refills | Status: DC | PRN

## 2016-12-09 MED ORDER — IBUPROFEN 600 MG PO TABS: 600.0000 mg | ORAL_TABLET | Freq: Four times a day (QID) | ORAL | 0 refills | Status: DC | PRN

## 2016-12-09 NOTE — ED Notes (Signed)
Pt asked tech for ankle brace because her ankle hurts when it is cold outside.  Pt also states she does not want to get up to use the bathroom for a urine sample because she hurts all over.  Pt did not seem interested in bedpan but liked the idea of a purewick external catheter.

## 2016-12-09 NOTE — ED Provider Notes (Signed)
[facility]-EMERGENCY DEPT Provider Note   CSN: [id number] Arrival date & time: 12/08/16  1932     History   Chief Complaint Chief Complaint  Patient presents with  . URI  . Abdominal Pain  . Back Pain    HPI Lisa Holt is a 26 y.o. female.  The patient is here with multiple complaints. She has URI symptoms of congestion, sneezing, cough x 3 days. She reports fever to 102 at home x 1 over the 3 day period. She has not taken anything for symptoms. Children at home without symptoms. No vomiting or significant headache. She has pain in her left shoulder and neck and in right mid-lateral back after heavy lifting yesterday. Pain is worse with movement or use. No SOB or increased pain with breathing. No flank pain or urinary symptoms. She also complains of abdominal pain that causes nausea. She has had this pain for months but "they can't tell me what's wrong." No new symptoms today.    The history is provided by the patient. No language interpreter was used.  URI   This is a new problem. The current episode started more than 2 days ago. The problem has not changed since onset.The maximum temperature recorded prior to her arrival was 102 to 102.9 F. The fever has been present for less than 1 day. Associated symptoms include abdominal pain, nausea, congestion, rhinorrhea, sneezing, neck pain and cough. Pertinent negatives include no chest pain, no vomiting, no dysuria, no headaches and no rash. She has tried nothing for the symptoms.  Abdominal Pain   This is a chronic problem. The problem occurs constantly. The problem has not changed since onset.The pain is located in the LLQ and LUQ. Associated symptoms include fever and nausea. Pertinent negatives include vomiting, dysuria and headaches. Past workup comments: Emergency room evaluations only - no outpatient follow up.  Back Pain   This is a new problem. The current episode started yesterday. The problem occurs  constantly. The problem has not changed since onset.The pain is associated with lifting heavy objects. The pain is present in the thoracic spine (Left neck and shoulder pain). The symptoms are aggravated by certain positions. Associated symptoms include a fever and abdominal pain. Pertinent negatives include no chest pain, no headaches, no dysuria, no paresthesias and no weakness. She has tried nothing for the symptoms.    Past Medical History:  Diagnosis Date  . Post partum depression    was prescribed meds, took 2, then stopped    Patient Active Problem List   Diagnosis Date Noted  . HSIL (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 08/06/2015    Past Surgical History:  Procedure Laterality Date  . WISDOM TOOTH EXTRACTION      OB History    Gravida Para Term Preterm AB Living   2 2 2     2    SAB TAB Ectopic Multiple Live Births         0 2       Home Medications    Prior to Admission medications   Medication Sig Start Date End Date Taking? Authorizing Provider  naproxen (NAPROSYN) 500 MG tablet Take 1 tablet (500 mg total) by mouth 2 (two) times daily. 02/08/16   Lyndal Pulley, MD  naproxen (NAPROSYN) 500 MG tablet Take 1 tablet (500 mg total) by mouth 2 (two) times daily. 04/07/16   Kirichenko, Lemont Fillers, PA-C  Prenatal Vit-Fe Fumarate-FA (PRENATAL VITAMIN) 27-0.8 MG TABS Take 1  tablet by mouth daily. Patient not taking: Reported on 10/13/2015 03/06/15   Constant, Peggy, MD    Family History Family History  Problem Relation Age of Onset  . Hypertension Father   . Diabetes Father   . Stroke Father     Social History Social History  Substance Use Topics  . Smoking status: Current Every Day Smoker    Packs/day: 1.00    Types: Cigarettes    Last attempt to quit: 06/05/2013  . Smokeless tobacco: Never Used  . Alcohol use No     Comment: occ- stopped with pregnancy     Allergies   Latex   Review of Systems Review of Systems    Constitutional: Positive for fever.  HENT: Positive for congestion, rhinorrhea, sneezing and voice change.   Respiratory: Positive for cough. Negative for shortness of breath.   Cardiovascular: Negative.  Negative for chest pain.  Gastrointestinal: Positive for abdominal pain and nausea. Negative for vomiting.  Genitourinary: Negative.  Negative for dysuria and flank pain.  Musculoskeletal: Positive for back pain and neck pain.  Skin: Negative.  Negative for color change, rash and wound.  Neurological: Negative.  Negative for weakness, headaches and paresthesias.     Physical Exam Updated Vital Signs BP 100/72   Pulse 83   Temp 99 F (37.2 C) (Oral)   Resp 18   Ht 5\' 3"  (1.6 m)   Wt 44.5 kg (98 lb)   LMP 11/24/2016 (Approximate)   SpO2 100%   BMI 17.36 kg/m   Physical Exam  Constitutional: She appears well-developed and well-nourished.  HENT:  Head: Normocephalic.  Neck: Normal range of motion. Neck supple.  Cardiovascular: Normal rate and regular rhythm.   No murmur heard. Pulmonary/Chest: Effort normal and breath sounds normal. She has no wheezes. She has no rales. She exhibits no tenderness.  Abdominal: Soft. Bowel sounds are normal. There is tenderness (Left upper and lower abdomen). There is no rebound and no guarding.  Musculoskeletal: Normal range of motion. She exhibits no edema.  Tender to lateral left neck into trapezius and rhomboid. FROM. ?spasm to trapezius. Right lateral back has no swelling. Minimally tender. No rib tenderness.   Neurological: She is alert. No cranial nerve deficit.  Skin: Skin is warm and dry. No rash noted.  Psychiatric: She has a normal mood and affect.     ED Treatments / Results  Labs (all labs ordered are listed, but only abnormal results are displayed) Labs Reviewed  COMPREHENSIVE METABOLIC PANEL - Abnormal; Notable for the following:       Result Value   Potassium 3.3 (*)    ALT 13 (*)    All other components within normal  limits  LIPASE, BLOOD  CBC  URINALYSIS, ROUTINE W REFLEX MICROSCOPIC  I-STAT BETA HCG BLOOD, ED ([facility], [facility], [facility] ONLY)   Results for orders placed or performed during the hospital encounter of 12/08/16  Lipase, blood  Result Value Ref Range   Lipase 30 11 - 51 U/L  Comprehensive metabolic panel  Result Value Ref Range   Sodium 137 135 - 145 mmol/L   Potassium 3.3 (L) 3.5 - 5.1 mmol/L   Chloride 104 101 - 111 mmol/L   CO2 26 22 - 32 mmol/L   Glucose, Bld 93 65 - 99 mg/dL   BUN 11 6 - 20 mg/dL   Creatinine, Ser 1.61 0.44 - 1.00 mg/dL   Calcium 9.4 8.9 - 09.6 mg/dL   Total Protein 7.4 6.5 - 8.1 g/dL  Albumin 4.1 3.5 - 5.0 g/dL   AST 17 15 - 41 U/L   ALT 13 (L) 14 - 54 U/L   Alkaline Phosphatase 45 38 - 126 U/L   Total Bilirubin 0.6 0.3 - 1.2 mg/dL   GFR calc non Af Amer >60 >60 mL/min   GFR calc Af Amer >60 >60 mL/min   Anion gap 7 5 - 15  CBC  Result Value Ref Range   WBC 9.1 4.0 - 10.5 K/uL   RBC 4.53 3.87 - 5.11 MIL/uL   Hemoglobin 12.5 12.0 - 15.0 g/dL   HCT 16.138.0 09.636.0 - 04.546.0 %   MCV 83.9 78.0 - 100.0 fL   MCH 27.6 26.0 - 34.0 pg   MCHC 32.9 30.0 - 36.0 g/dL   RDW 40.914.8 81.111.5 - 91.415.5 %   Platelets 153 150 - 400 K/uL  I-Stat Beta hCG blood, ED ([facility], [facility], [facility] only)  Result Value Ref Range   I-stat hCG, quantitative <5.0 <5 mIU/mL   Comment 3             EKG  EKG Interpretation None       Radiology No results found.  Procedures Procedures (including critical care time)  Medications Ordered in ED Medications - No data to display   Initial Impression / Assessment and Plan / ED Course  I have reviewed the triage vital signs and the nursing notes.  Pertinent labs & imaging results that were available during my care of the patient were reviewed by me and considered in my medical decision making (see chart for details).     Patient with multiple complaints:  Neck and back pain - likely muscular as it occurred after heavy lifting, is worse with movement. Will  treat accordingly.  URI symptoms - no fever here and she has not treated fever prior to arrival. No generalized body aches. Not likely to be flu. Will suggest supportive measures OTC for viral URI.   Abdominal pain - chronic and unchanged.   The patient is also requesting an ankle brace for recurrent right ankle pain since remote injury. She denies recent injury. Will recommend ace wrap or OTC ankle brace prn.  Final Clinical Impressions(s) / ED Diagnoses   Final diagnoses:  None   1. Muscular neck pain 2. Muscle spasm 3. Chronic abdominal pain 4. Chronic recurrent ankle pain, right 5. URI, viral  New Prescriptions New Prescriptions   No medications on file     Elpidio AnisUpstill, Ocie Stanzione, PA-C 12/09/16 0143    Molpus, Jonny RuizJohn, MD 12/09/16 [phone]

## 2016-12-09 NOTE — Discharge Instructions (Signed)
For cold: Recommend Tylenol Cold and sinus for symptomatic treatment. You can add Mucinex if cough is significant.  For chronic abdominal pain: Recommend outpatient follow up with your primary care provider.   For muscle spasm of neck and muscle pain of back: take Flexeril as prescribed, being cautious when taking care of you infant children as it can cause drowsiness.   For ankle pain that is recurrent since injury in the past: Recommend an ACE wrap which can be purchased in any drug store when pain is flaring up.
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
## 2017-04-04 ENCOUNTER — Encounter (HOSPITAL_COMMUNITY): Payer: Self-pay | Admitting: Emergency Medicine

## 2017-04-04 ENCOUNTER — Emergency Department (HOSPITAL_COMMUNITY)
Admission: EM | Admit: 2017-04-04 | Discharge: 2017-04-04 | Disposition: A | Discharge status: Home / Self Care | Payer: Medicaid Other | Attending: Emergency Medicine | Admitting: Emergency Medicine

## 2017-04-04 ENCOUNTER — Emergency Department (HOSPITAL_COMMUNITY): Payer: Medicaid Other

## 2017-04-04 ENCOUNTER — Other Ambulatory Visit: Payer: Self-pay

## 2017-04-04 DIAGNOSIS — R1084 Generalized abdominal pain: Secondary | ICD-10-CM | POA: Diagnosis not present

## 2017-04-04 DIAGNOSIS — B9689 Other specified bacterial agents as the cause of diseases classified elsewhere: Secondary | ICD-10-CM

## 2017-04-04 DIAGNOSIS — R1110 Vomiting, unspecified: Secondary | ICD-10-CM | POA: Diagnosis not present

## 2017-04-04 DIAGNOSIS — R102 Pelvic and perineal pain: Secondary | ICD-10-CM

## 2017-04-04 DIAGNOSIS — N760 Acute vaginitis: Secondary | ICD-10-CM | POA: Diagnosis not present

## 2017-04-04 DIAGNOSIS — F17210 Nicotine dependence, cigarettes, uncomplicated: Secondary | ICD-10-CM | POA: Diagnosis not present

## 2017-04-04 DIAGNOSIS — R109 Unspecified abdominal pain: Secondary | ICD-10-CM

## 2017-04-04 LAB — WET PREP, GENITAL
Sperm: NONE SEEN
TRICH WET PREP: NONE SEEN
Yeast Wet Prep HPF POC: NONE SEEN

## 2017-04-04 LAB — CBC
HEMATOCRIT: 36.4 % (ref 36.0–46.0)
Hemoglobin: 12.1 g/dL (ref 12.0–15.0)
MCH: 28 pg (ref 26.0–34.0)
MCHC: 33.2 g/dL (ref 30.0–36.0)
MCV: 84.3 fL (ref 78.0–100.0)
Platelets: 166 10*3/uL (ref 150–400)
RBC: 4.32 MIL/uL (ref 3.87–5.11)
RDW: 15.4 % (ref 11.5–15.5)
WBC: 14.1 10*3/uL — ABNORMAL HIGH (ref 4.0–10.5)

## 2017-04-04 LAB — COMPREHENSIVE METABOLIC PANEL
ALBUMIN: 4.3 g/dL (ref 3.5–5.0)
ALT: 14 U/L (ref 14–54)
AST: 17 U/L (ref 15–41)
Alkaline Phosphatase: 48 U/L (ref 38–126)
Anion gap: 11 (ref 5–15)
BILIRUBIN TOTAL: 0.7 mg/dL (ref 0.3–1.2)
BUN: 12 mg/dL (ref 6–20)
CO2: 23 mmol/L (ref 22–32)
CREATININE: 0.75 mg/dL (ref 0.44–1.00)
Calcium: 9.1 mg/dL (ref 8.9–10.3)
Chloride: 107 mmol/L (ref 101–111)
GFR calc Af Amer: >60 mL/min (ref >60)
GFR calc non Af Amer: >60 mL/min (ref >60)
Glucose, Bld: 91 mg/dL (ref 65–99)
POTASSIUM: 3.5 mmol/L (ref 3.5–5.1)
Sodium: 141 mmol/L (ref 135–145)
TOTAL PROTEIN: 7.5 g/dL (ref 6.5–8.1)

## 2017-04-04 LAB — I-STAT BETA HCG BLOOD, ED (MC, WL, AP ONLY)

## 2017-04-04 LAB — LIPASE, BLOOD: Lipase: 26 U/L (ref 11–51)

## 2017-04-04 LAB — URINALYSIS, ROUTINE W REFLEX MICROSCOPIC
Bilirubin Urine: NEGATIVE
Glucose, UA: NEGATIVE mg/dL
HGB URINE DIPSTICK: NEGATIVE
Ketones, ur: 20 mg/dL — AB
NITRITE: NEGATIVE
Protein, ur: 100 mg/dL — AB
SPECIFIC GRAVITY, URINE: 1.035 — AB (ref 1.005–1.030)
pH: 5 (ref 5.0–8.0)

## 2017-04-04 MED ORDER — AZITHROMYCIN 250 MG PO TABS: 500.0000 mg | ORAL_TABLET | Freq: Once | ORAL | Status: DC

## 2017-04-04 MED ORDER — ONDANSETRON 4 MG PO TBDP
4.0000 mg | ORAL_TABLET | Freq: Once | ORAL | Status: AC
  Administered 2017-04-04: 4 mg via ORAL
  Filled 2017-04-04: qty 1

## 2017-04-04 MED ORDER — STERILE WATER FOR INJECTION IJ SOLN
INTRAMUSCULAR | Status: AC
  Administered 2017-04-04: 10 mL
  Filled 2017-04-04: qty 10

## 2017-04-04 MED ORDER — AZITHROMYCIN 250 MG PO TABS
1000.0000 mg | ORAL_TABLET | Freq: Once | ORAL | Status: AC
  Administered 2017-04-04: 1000 mg via ORAL
  Filled 2017-04-04: qty 4

## 2017-04-04 MED ORDER — DICYCLOMINE HCL 20 MG PO TABS: 20.0000 mg | ORAL_TABLET | Freq: Three times a day (TID) | ORAL | 0 refills | Status: DC | PRN

## 2017-04-04 MED ORDER — KETOROLAC TROMETHAMINE 15 MG/ML IJ SOLN
15.0000 mg | Freq: Once | INTRAMUSCULAR | Status: AC
  Administered 2017-04-04: 15 mg via INTRAVENOUS
  Filled 2017-04-04: qty 1

## 2017-04-04 MED ORDER — METRONIDAZOLE 500 MG PO TABS: 500.0000 mg | ORAL_TABLET | Freq: Two times a day (BID) | ORAL | 0 refills | Status: DC

## 2017-04-04 MED ORDER — NAPROXEN 500 MG PO TABS: 500.0000 mg | ORAL_TABLET | Freq: Two times a day (BID) | ORAL | 0 refills | Status: DC

## 2017-04-04 MED ORDER — CEFTRIAXONE SODIUM 250 MG IJ SOLR
250.0000 mg | Freq: Once | INTRAMUSCULAR | Status: AC
  Administered 2017-04-04: 250 mg via INTRAMUSCULAR
  Filled 2017-04-04: qty 250
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
## 2017-04-05 LAB — GC/CHLAMYDIA PROBE AMP (~~LOC~~) NOT AT ARMC
Chlamydia: NEGATIVE
Neisseria Gonorrhea: NEGATIVE

## 2017-05-01 ENCOUNTER — Encounter: Payer: Self-pay | Admitting: *Deleted

## 2017-07-31 ENCOUNTER — Ambulatory Visit (HOSPITAL_COMMUNITY)
Admission: EM | Admit: 2017-07-31 | Discharge: 2017-07-31 | Disposition: A | Discharge status: Home / Self Care | Payer: Medicaid Other | Attending: Family Medicine | Admitting: Family Medicine

## 2017-07-31 ENCOUNTER — Encounter (HOSPITAL_COMMUNITY): Payer: Self-pay | Admitting: Emergency Medicine

## 2017-07-31 ENCOUNTER — Telehealth (HOSPITAL_COMMUNITY): Payer: Self-pay

## 2017-07-31 DIAGNOSIS — R109 Unspecified abdominal pain: Secondary | ICD-10-CM | POA: Diagnosis not present

## 2017-07-31 DIAGNOSIS — A084 Viral intestinal infection, unspecified: Secondary | ICD-10-CM

## 2017-07-31 MED ORDER — ONDANSETRON 4 MG PO TBDP
2.0000 mg | ORAL_TABLET | Freq: Once | ORAL | Status: AC
  Administered 2017-07-31: 4 mg via ORAL

## 2017-07-31 MED ORDER — ONDANSETRON 4 MG PO TBDP
ORAL_TABLET | ORAL | Status: AC
  Filled 2017-07-31: qty 1

## 2017-07-31 MED ORDER — ONDANSETRON HCL 4 MG/2ML IJ SOLN: 8.0000 mg | Freq: Once | INTRAMUSCULAR | Status: DC

## 2017-07-31 MED ORDER — ONDANSETRON HCL 4 MG PO TABS: 4.0000 mg | ORAL_TABLET | Freq: Four times a day (QID) | ORAL | 0 refills | Status: DC

## 2017-07-31 MED ORDER — ONDANSETRON HCL 4 MG/2ML IJ SOLN
INTRAMUSCULAR | Status: AC
  Filled 2017-07-31: qty 2

## 2017-07-31 MED ORDER — ACETAMINOPHEN 325 MG PO TABS
ORAL_TABLET | ORAL | Status: AC
  Filled 2017-07-31: qty 2

## 2017-07-31 MED ORDER — ONDANSETRON HCL 4 MG/2ML IJ SOLN
4.0000 mg | Freq: Once | INTRAMUSCULAR | Status: AC
  Administered 2017-07-31: 4 mg via INTRAMUSCULAR

## 2017-07-31 MED ORDER — KETOROLAC TROMETHAMINE 30 MG/ML IJ SOLN
30.0000 mg | Freq: Once | INTRAMUSCULAR | Status: AC
  Administered 2017-07-31: 30 mg via INTRAMUSCULAR

## 2017-07-31 MED ORDER — ACETAMINOPHEN 325 MG PO TABS
650.0000 mg | ORAL_TABLET | Freq: Once | ORAL | Status: AC
  Administered 2017-07-31: 650 mg via ORAL

## 2017-07-31 MED ORDER — KETOROLAC TROMETHAMINE 30 MG/ML IJ SOLN
INTRAMUSCULAR | Status: AC
  Filled 2017-07-31: qty 1

## 2017-07-31 NOTE — ED Triage Notes (Signed)
Pt here for fever and body aches; pt sts weakness and N/V

## 2017-07-31 NOTE — ED Provider Notes (Signed)
[facility]    CSN: [id number] Arrival date & time: 07/31/17  1126     History   Chief Complaint Chief Complaint  Patient presents with  . Fever  . Generalized Body Aches    HPI Lisa Holt is a 27 y.o. female.   HPI  Nausea, vomiting, and fever since yesterday.  Tried to take Tylenol for the fever but throughout.  Temperature to 102 today.  Brought in by mother for evaluation.  Crampy abdominal pain comes and goes.  No diarrhea.  With fever she has generalized body aches.  Mild cough.  Mild postnasal drip.  No ear pressure or pain.  No headache.  No sore throat or difficulty swallowing.  Mother is concerned with the vomiting that she may become dehydrated.  No known exposure to anyone with illness.  No new or questionable food ingestion.  No travel Past Medical History:  Diagnosis Date  . Post partum depression    was prescribed meds, took 2, then stopped    Patient Active Problem List   Diagnosis Date Noted  . HSIL (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 08/06/2015    Past Surgical History:  Procedure Laterality Date  . WISDOM TOOTH EXTRACTION      OB History    Gravida  2   Para  2   Term  2   Preterm      AB      Living  2     SAB      TAB      Ectopic      Multiple  0   Live Births  2            Home Medications    Prior to Admission medications   Medication Sig Start Date End Date Taking? Authorizing Provider  etonogestrel (NEXPLANON) 68 MG IMPL implant 1 each by Subdermal route once.    [provider]  ibuprofen (ADVIL,MOTRIN) 600 MG tablet Take 1 tablet (600 mg total) by mouth every 6 (six) hours as needed. 12/09/16   Elpidio AnisUpstill, Shari, PA-C  Ibuprofen (MIDOL) 200 MG CAPS Take 200 mg by mouth daily.    [provider]  ondansetron (ZOFRAN) 4 MG tablet Take 1 tablet (4 mg total) by mouth every 6 (six) hours. 07/31/17   Eustace MooreNelson, Gaelle Adriance Sue, MD    Family History Family History    Problem Relation Age of Onset  . Hypertension Father   . Diabetes Father   . Stroke Father     Social History Social History   Tobacco Use  . Smoking status: Current Every Day Smoker    Packs/day: 1.00    Types: Cigarettes    Last attempt to quit: 06/05/2013    Years since quitting: 4.1  . Smokeless tobacco: Never Used  Substance Use Topics  . Alcohol use: No    Comment: occ- stopped with pregnancy  . Drug use: No    Types: Marijuana, Cocaine    Comment: las Cocaine use Nov. 2016, last marijuana 02/22/15     Allergies   Latex   Review of Systems Review of Systems  Constitutional: Positive for fever. Negative for chills.  HENT: Positive for rhinorrhea. Negative for ear pain and sore throat.   Eyes: Negative for pain and visual disturbance.  Respiratory: Positive for cough. Negative for shortness of breath.   Cardiovascular: Negative for chest pain and palpitations.  Gastrointestinal: Positive for abdominal pain, nausea and vomiting. Negative for  diarrhea.  Genitourinary: Negative for dysuria and hematuria.  Musculoskeletal: Positive for myalgias. Negative for arthralgias and back pain.  Skin: Negative for color change and rash.  Neurological: Negative for seizures and syncope.  All other systems reviewed and are negative.    Physical Exam Triage Vital Signs ED Triage Vitals [07/31/17 1238]  Enc Vitals Group     BP 110/79     Pulse Rate (!) 113     Resp 18     Temp (!) 102.2 F (39 C)     Temp Source Oral     SpO2 100 %     Weight      Height      Head Circumference      Peak Flow      Pain Score      Pain Loc      Pain Edu?      Excl. in GC?    No data found.  Updated Vital Signs BP 110/79 (BP Location: Left Arm)   Pulse (!) 113   Temp (!) 102.2 F (39 C) (Oral)   Resp 18   SpO2 100%   Visual Acuity Right Eye Distance:   Left Eye Distance:   Bilateral Distance:    Right Eye Near:   Left Eye Near:    Bilateral Near:     Physical Exam   Constitutional: She appears well-developed and well-nourished. She appears distressed.  Appears moderately ill.  Wrapped in blankets on exam table.  HENT:  Head: Normocephalic and atraumatic.  Right Ear: External ear normal.  Left Ear: External ear normal.  Mouth/Throat: Oropharynx is clear and moist.  Mucous membranes are moist  Eyes: Pupils are equal, round, and reactive to light. Conjunctivae are normal.  Neck: Normal range of motion.  Cardiovascular: Regular rhythm and normal heart sounds.  Tachycardia  Pulmonary/Chest: Effort normal and breath sounds normal. No respiratory distress.  Lungs are clear  Abdominal: Soft. Bowel sounds are normal. She exhibits no distension. There is tenderness.  Active bowel sounds.  Generalized tenderness to deep palpation.  No guarding or rebound.  No organomegaly.  Musculoskeletal: Normal range of motion. She exhibits no edema.  Lymphadenopathy:    She has no cervical adenopathy.  Neurological: She is alert.  Skin: Skin is warm and dry.  No tenting of skin     UC Treatments / Results  Labs (all labs ordered are listed, but only abnormal results are displayed) Labs Reviewed - No data to display  EKG None  Radiology No results found.  Procedures Procedures (including critical care time)  Medications Ordered in UC Medications  acetaminophen (TYLENOL) tablet 650 mg (650 mg Oral Given 07/31/17 1249)  ondansetron (ZOFRAN-ODT) disintegrating tablet 2 mg (4 mg Oral Given 07/31/17 1249)  ondansetron (ZOFRAN) injection 4 mg (4 mg Intramuscular Given 07/31/17 1327)  ketorolac (TORADOL) 30 MG/ML injection 30 mg (30 mg Intramuscular Given 07/31/17 1327)    Initial Impression / Assessment and Plan / UC Course  I have reviewed the triage vital signs and the nursing notes.  Pertinent labs & imaging results that were available during my care of the patient were reviewed by me and considered in my medical decision making (see chart for details).      Patient was treated with oral Tylenol and Zofran.  She vomited this up.  She was then given a Zofran injection and Toradol injection.  After 1 hour she was reevaluated.  She was called and sleeping.  Abdominal pain had resolved.  Vomiting is stopped.  She was able to keep down sips of liquid. Final Clinical Impressions(s) / UC Diagnoses   Final diagnoses:  Viral gastroenteritis  Discussed viral gastroenteritis.  Expected course.  Reason for return   Discharge Instructions     Plenty of fluids [place] diet Zofran as needed for nausea Acetaminophen for fever and pain Return if unable to keep fluids down, or if the pain and fever worsen in spite of treatment   ED Prescriptions    Medication Sig Dispense Auth. Provider   ondansetron (ZOFRAN) 4 MG tablet Take 1 tablet (4 mg total) by mouth every 6 (six) hours. 12 tablet Eustace Moore, MD     Controlled Substance Prescriptions [place] Controlled Substance Registry consulted? Not Applicable   Eustace Moore, MD 07/31/17 [phone]

## 2017-07-31 NOTE — Discharge Instructions (Signed)
Plenty of fluids [place] diet Zofran as needed for nausea Acetaminophen for fever and pain Return if unable to keep fluids down, or if the pain and fever worsen in spite of treatment

## 2018-01-20 ENCOUNTER — Inpatient Hospital Stay (HOSPITAL_COMMUNITY)
Admission: EM | Admit: 2018-01-20 | Discharge: 2018-01-23 | DRG: 872 | Disposition: A | Discharge status: Home / Self Care | Payer: Medicaid Other | Attending: Internal Medicine | Admitting: Internal Medicine

## 2018-01-20 ENCOUNTER — Encounter (HOSPITAL_COMMUNITY): Payer: Self-pay | Admitting: Emergency Medicine

## 2018-01-20 ENCOUNTER — Emergency Department (HOSPITAL_COMMUNITY): Payer: Medicaid Other

## 2018-01-20 DIAGNOSIS — Z793 Long term (current) use of hormonal contraceptives: Secondary | ICD-10-CM

## 2018-01-20 DIAGNOSIS — E876 Hypokalemia: Secondary | ICD-10-CM | POA: Diagnosis present

## 2018-01-20 DIAGNOSIS — D61818 Other pancytopenia: Secondary | ICD-10-CM

## 2018-01-20 DIAGNOSIS — D509 Iron deficiency anemia, unspecified: Secondary | ICD-10-CM | POA: Diagnosis present

## 2018-01-20 DIAGNOSIS — Z823 Family history of stroke: Secondary | ICD-10-CM

## 2018-01-20 DIAGNOSIS — Z833 Family history of diabetes mellitus: Secondary | ICD-10-CM

## 2018-01-20 DIAGNOSIS — J101 Influenza due to other identified influenza virus with other respiratory manifestations: Secondary | ICD-10-CM | POA: Diagnosis present

## 2018-01-20 DIAGNOSIS — Z8249 Family history of ischemic heart disease and other diseases of the circulatory system: Secondary | ICD-10-CM | POA: Diagnosis not present

## 2018-01-20 DIAGNOSIS — A4189 Other specified sepsis: Principal | ICD-10-CM | POA: Diagnosis present

## 2018-01-20 DIAGNOSIS — Z91040 Latex allergy status: Secondary | ICD-10-CM | POA: Diagnosis not present

## 2018-01-20 DIAGNOSIS — N920 Excessive and frequent menstruation with regular cycle: Secondary | ICD-10-CM | POA: Diagnosis present

## 2018-01-20 DIAGNOSIS — Z791 Long term (current) use of non-steroidal anti-inflammatories (NSAID): Secondary | ICD-10-CM

## 2018-01-20 DIAGNOSIS — R001 Bradycardia, unspecified: Secondary | ICD-10-CM | POA: Diagnosis present

## 2018-01-20 DIAGNOSIS — F17210 Nicotine dependence, cigarettes, uncomplicated: Secondary | ICD-10-CM | POA: Diagnosis present

## 2018-01-20 DIAGNOSIS — R079 Chest pain, unspecified: Secondary | ICD-10-CM

## 2018-01-20 DIAGNOSIS — A419 Sepsis, unspecified organism: Secondary | ICD-10-CM

## 2018-01-20 LAB — I-STAT CG4 LACTIC ACID, ED: LACTIC ACID, VENOUS: 0.89 mmol/L (ref 0.5–1.9)

## 2018-01-20 LAB — I-STAT TROPONIN, ED: TROPONIN I, POC: 0 ng/mL (ref 0.00–0.08)

## 2018-01-20 LAB — CBC
HCT: 37.5 % (ref 36.0–46.0)
HEMOGLOBIN: 11.7 g/dL — AB (ref 12.0–15.0)
MCH: 26.8 pg (ref 26.0–34.0)
MCHC: 31.2 g/dL (ref 30.0–36.0)
MCV: 86 fL (ref 80.0–100.0)
NRBC: 0 % (ref 0.0–0.2)
PLATELETS: 102 10*3/uL — AB (ref 150–400)
RBC: 4.36 MIL/uL (ref 3.87–5.11)
RDW: 15.9 % — ABNORMAL HIGH (ref 11.5–15.5)
WBC: 3.9 10*3/uL — AB (ref 4.0–10.5)

## 2018-01-20 LAB — BASIC METABOLIC PANEL
ANION GAP: 8 (ref 5–15)
BUN: 8 mg/dL (ref 6–20)
CO2: 26 mmol/L (ref 22–32)
Calcium: 8.5 mg/dL — ABNORMAL LOW (ref 8.9–10.3)
Chloride: 103 mmol/L (ref 98–111)
Creatinine, Ser: 1.05 mg/dL — ABNORMAL HIGH (ref 0.44–1.00)
Glucose, Bld: 95 mg/dL (ref 70–99)
POTASSIUM: 3.3 mmol/L — AB (ref 3.5–5.1)
SODIUM: 137 mmol/L (ref 135–145)

## 2018-01-20 LAB — HEPATIC FUNCTION PANEL
ALT: 13 U/L (ref 0–44)
AST: 18 U/L (ref 15–41)
Albumin: 3.4 g/dL — ABNORMAL LOW (ref 3.5–5.0)
Alkaline Phosphatase: 31 U/L — ABNORMAL LOW (ref 38–126)
Bilirubin, Direct: 0.1 mg/dL (ref 0.0–0.2)
Indirect Bilirubin: 0.4 mg/dL (ref 0.3–0.9)
Total Bilirubin: 0.5 mg/dL (ref 0.3–1.2)
Total Protein: 5.9 g/dL — ABNORMAL LOW (ref 6.5–8.1)

## 2018-01-20 LAB — URINALYSIS, ROUTINE W REFLEX MICROSCOPIC
Bilirubin Urine: NEGATIVE
Glucose, UA: NEGATIVE mg/dL
Hgb urine dipstick: NEGATIVE
Ketones, ur: NEGATIVE mg/dL
Nitrite: NEGATIVE
Protein, ur: NEGATIVE mg/dL
Specific Gravity, Urine: 1.026 (ref 1.005–1.030)
WBC, UA: >50 WBC/hpf — ABNORMAL HIGH (ref 0–5)
pH: 5 (ref 5.0–8.0)

## 2018-01-20 LAB — INFLUENZA PANEL BY PCR (TYPE A & B)
Influenza A By PCR: NEGATIVE
Influenza B By PCR: POSITIVE — AB

## 2018-01-20 LAB — I-STAT BETA HCG BLOOD, ED (MC, WL, AP ONLY): I-stat hCG, quantitative: <5 m[IU]/mL (ref <5)

## 2018-01-20 MED ORDER — ACETAMINOPHEN 325 MG PO TABS
650.0000 mg | ORAL_TABLET | Freq: Once | ORAL | Status: AC
  Administered 2018-01-20: 650 mg via ORAL
  Filled 2018-01-20: qty 2

## 2018-01-20 MED ORDER — SODIUM CHLORIDE 0.9 % IV BOLUS (SEPSIS)
500.0000 mL | Freq: Once | INTRAVENOUS | Status: AC
  Administered 2018-01-20: 500 mL via INTRAVENOUS

## 2018-01-20 MED ORDER — POTASSIUM CHLORIDE CRYS ER 20 MEQ PO TBCR
40.0000 meq | EXTENDED_RELEASE_TABLET | Freq: Once | ORAL | Status: AC
  Administered 2018-01-20: 40 meq via ORAL
  Filled 2018-01-20: qty 2

## 2018-01-20 MED ORDER — OSELTAMIVIR PHOSPHATE 75 MG PO CAPS
75.0000 mg | ORAL_CAPSULE | Freq: Two times a day (BID) | ORAL | Status: AC
  Administered 2018-01-20 – 2018-01-22 (×5): 75 mg via ORAL
  Filled 2018-01-20 (×5): qty 1

## 2018-01-20 MED ORDER — SODIUM CHLORIDE 0.9 % IV SOLN
2.0000 g | INTRAVENOUS | Status: DC
  Administered 2018-01-20: 2 g via INTRAVENOUS
  Filled 2018-01-20: qty 20

## 2018-01-20 MED ORDER — SODIUM CHLORIDE 0.9 % IV BOLUS
1000.0000 mL | Freq: Once | INTRAVENOUS | Status: AC
  Administered 2018-01-20: 1000 mL via INTRAVENOUS

## 2018-01-20 MED ORDER — SODIUM CHLORIDE 0.9 % IV BOLUS (SEPSIS)
1000.0000 mL | Freq: Once | INTRAVENOUS | Status: AC
  Administered 2018-01-20: 1000 mL via INTRAVENOUS

## 2018-01-20 MED ORDER — SODIUM CHLORIDE 0.9 % IV SOLN
500.0000 mg | INTRAVENOUS | Status: DC
  Administered 2018-01-20: 500 mg via INTRAVENOUS
  Filled 2018-01-20: qty 500

## 2018-01-20 NOTE — ED Notes (Signed)
Pt placed on PW 

## 2018-01-20 NOTE — H&P (Signed)
History and Physical    Lisa DubonnetSabrina N Holt ZOX:[id number] DOB: [birth year] DOA: 01/20/2018  [facility]P: [facility] Patient coming from: Home  Chief Complaint: Chest pain, flu symptoms  HPI: Lisa Holt is a 27 y.o. female with medical history significant of postpartum depression presenting to the hospital for evaluation of chest pain and flu symptoms.  Patient reports having a 2-day history of back and joint pains, eye muscles tightening up, and chest pain.  She describes the chest pain as both stabbing and heaviness at rest which is intermittent in nature.  Also reports having fevers and chills.  She has been feeling dizzy.  Her p.o. intake has been reduced.  She has been nauseous and has been vomiting.  No recent sick contacts.  Denies having any dysuria, urinary frequency, or urgency.  States she has been told before she is anemic and reports having menorrhagia for which she is currently on an OCP.   ED Course: Temperature 103 F, hypotensive (blood pressure 78/38 arrival).  Tachycardic on admission, now pulse normal.  Satting well on room air.  No leukocytosis.  Lactic acid normal.  LFTs grossly normal.  UA showing large amount of leukocytes, rare bacteria, >50 WBCs/ HPF, and negative nitrite.  Influenza B PCR positive. I-STAT troponin negative.  EKG showing mild ST depressions in leads II, III, aVF, V3, V5, and V6.  Chest x-ray showing no active cardiopulmonary disease.  Tamiflu ordered and patient started on azithromycin and ceftriaxone in the ED.  2.5 L fluid boluses ordered.  Review of Systems: As per HPI otherwise 10 point review of systems negative.  Past Medical History:  Diagnosis Date  . Post partum depression    was prescribed meds, took 2, then stopped    Past Surgical History:  Procedure Laterality Date  . WISDOM TOOTH EXTRACTION       reports that she has been smoking cigarettes. She has been smoking about 1.00 pack per day. She has never used smokeless tobacco. She  reports that she does not drink alcohol or use drugs.  Allergies  Allergen Reactions  . Latex Itching and Swelling    Family History  Problem Relation Age of Onset  . Hypertension Father   . Diabetes Father   . Stroke Father     Prior to Admission medications   Medication Sig Start Date End Date Taking? Authorizing Provider  etonogestrel (NEXPLANON) 68 MG IMPL implant 1 each by Subdermal route once.    [provider]  ibuprofen (ADVIL,MOTRIN) 600 MG tablet Take 1 tablet (600 mg total) by mouth every 6 (six) hours as needed. 12/09/16   Elpidio AnisUpstill, Shari, PA-C  Ibuprofen (MIDOL) 200 MG CAPS Take 200 mg by mouth daily.    [provider]  ondansetron (ZOFRAN) 4 MG tablet Take 1 tablet (4 mg total) by mouth every 6 (six) hours. 07/31/17   Isa RankinMurray, Laura Wilson, MD    Physical Exam: Vitals:   01/21/18 0104 01/21/18 0300 01/21/18 0302 01/21/18 0431  BP: 104/78 (!) 85/74 (!) 86/56 (!) 111/96  Pulse: 71 92 84 87  Resp: 11 (!) 21 (!) 31   Temp:    (!) 100.4 F (38 C)  TempSrc:    Oral  SpO2: 99% 95% 100% 97%  Weight: 47.3 kg     Height: 5\' 3"  (1.6 m)       Physical Exam  Constitutional: She is oriented to person, place, and time. She appears well-developed and well-nourished. She appears distressed.  Coughing  HENT:  Head: Normocephalic.  Mouth/Throat: Oropharynx is clear and moist.  Eyes: Right eye exhibits no discharge. Left eye exhibits no discharge.  Neck: Neck supple.  Cardiovascular: Normal rate, regular rhythm and intact distal pulses.  Pulmonary/Chest: Effort normal and breath sounds normal. No respiratory distress. She has no wheezes. She has no rales.  Abdominal: Soft. Bowel sounds are normal. She exhibits no distension. There is no tenderness.  Musculoskeletal: She exhibits no edema.  Neurological: She is alert and oriented to person, place, and time.  Skin: Skin is warm and dry. She is not diaphoretic.     Labs on Admission: I have personally  reviewed following labs and imaging studies  CBC: Recent Labs  Lab 01/20/18 1910  WBC 3.9*  HGB 11.7*  HCT 37.5  MCV 86.0  PLT 102*   Basic Metabolic Panel: Recent Labs  Lab 01/20/18 1910 01/21/18 0044  NA 137  --   K 3.3*  --   CL 103  --   CO2 26  --   GLUCOSE 95  --   BUN 8  --   CREATININE 1.05*  --   CALCIUM 8.5*  --   MG  --  1.3*   GFR: Estimated Creatinine Clearance: 61.7 mL/min (A) (by C-G formula based on SCr of 1.05 mg/dL (H)). Liver Function Tests: Recent Labs  Lab 01/20/18 1956  AST 18  ALT 13  ALKPHOS 31*  BILITOT 0.5  PROT 5.9*  ALBUMIN 3.4*   No results for input(s): LIPASE, AMYLASE in the last 168 hours. No results for input(s): AMMONIA in the last 168 hours. Coagulation Profile: No results for input(s): INR, PROTIME in the last 168 hours. Cardiac Enzymes: Recent Labs  Lab 01/21/18 0044  TROPONINI <0.03   BNP (last 3 results) No results for input(s): PROBNP in the last 8760 hours. HbA1C: No results for input(s): HGBA1C in the last 72 hours. CBG: No results for input(s): GLUCAP in the last 168 hours. Lipid Profile: No results for input(s): CHOL, HDL, LDLCALC, TRIG, CHOLHDL, LDLDIRECT in the last 72 hours. Thyroid Function Tests: No results for input(s): TSH, T4TOTAL, FREET4, T3FREE, THYROIDAB in the last 72 hours. Anemia Panel: No results for input(s): VITAMINB12, FOLATE, FERRITIN, TIBC, IRON, RETICCTPCT in the last 72 hours. Urine analysis:    Component Value Date/Time   COLORURINE YELLOW 01/20/2018 2053   APPEARANCEUR CLOUDY (A) 01/20/2018 2053   LABSPEC 1.026 01/20/2018 2053   PHURINE 5.0 01/20/2018 2053   GLUCOSEU NEGATIVE 01/20/2018 2053   HGBUR NEGATIVE 01/20/2018 2053   BILIRUBINUR NEGATIVE 01/20/2018 2053   KETONESUR NEGATIVE 01/20/2018 2053   PROTEINUR NEGATIVE 01/20/2018 2053   UROBILINOGEN 1.0 08/27/2015 1351   NITRITE NEGATIVE 01/20/2018 2053   LEUKOCYTESUR LARGE (A) 01/20/2018 2053    Radiological Exams on  Admission: Dg Chest 2 View  Result Date: 01/20/2018 CLINICAL DATA:  Left-sided chest pain radiating posteriorly with fever and shortness of breath since this morning. EXAM: CHEST - 2 VIEW COMPARISON:  04/04/2017 FINDINGS: The heart size and mediastinal contours are within normal limits. Both lungs are clear. The visualized skeletal structures are unremarkable. IMPRESSION: No active cardiopulmonary disease. Electronically Signed   By: Elberta Fortis M.D.   On: 01/20/2018 20:11    EKG: Independently reviewed.  Sinus tachycardia, ST depressions in leads II, III, aVF, V3, V5, and V6.  No prior EKG for comparison.  Assessment/Plan Principal Problem:   Influenza due to influenza virus, type B Active Problems:   Sepsis (HCC)   Chest pain  Pancytopenia (HCC)   Hypokalemia   Hypomagnesemia   Sepsis secondary to influenza B infection Presenting with a 2-day history of flulike symptoms.  Temperature 103 F on admission.  Initially hypotensive, blood pressure now improved with IV fluid resuscitation.Tachycardic on admission, now pulse normal.  Satting well on room air.  No leukocytosis.  Lactic acid normal.  UA showing large amount of leukocytes, rare bacteria, >50 WBCs/ HPF, and negative nitrite.  Patient does not endorse any UTI symptoms.  Influenza B PCR positive. Chest x-ray showing no active cardiopulmonary disease.   -Patient was started on ceftriaxone and azithromycin in the ED for coverage of possible pneumonia.  Procalcitonin <0.10. Will d/c antibiotics. -Tamiflu 75 mg twice daily -Continue IV fluid resuscitation -Urine culture pending -Blood culture x2 pending -Tylenol PRN -Tessalon pearls prn cough  -IV Zofran PRN nausea/ vomiting   Chest pain Likely related to demand ischemia from sepsis and influenza infection.  Initial EKG showing mild ST depressions in leads II, III, aVF, V3, V5, and V6. Repeat EKG showing resolution of these findings.  Troponin x 2 negative.  -Continue to trend  troponin   Pancytopenia White count 3.9. Hemoglobin 11.7, was 12.1 nine months ago. Platelet count 102,000, normal nine months ago.  No signs of active bleeding.  Patient does endorse menorrhagia which could explain the anemia. -Check iron, ferritin, TIBC -CBC in a.m. -Outpatient GYN follow-up for menorrhagia   Mild hypokalemia, hypomagnesemia In the setting vomiting from influenza infection.  Potassium 3.3.  Magnesium 1.3. -Replete potassium and magnesium -Recheck BMP and magnesium level in the morning  DVT prophylaxis: Lovenox Code Status: Full code Family Communication: No family available Disposition Plan: Anticipate discharge after clinical improvement. Consults called: None Admission status: It is my clinical opinion that admission to INPATIENT is reasonable and necessary in this 27 y.o. female . presenting with symptoms of chest pain, flulike symptoms, concerning for influenza infection . with pertinent positives on physical exam including: Coughing . and pertinent positives on radiographic and laboratory data including: Influenza B positive . Workup and treatment include IV fluid for hypotension, IV antiemetic for nausea/vomiting, Tamiflu, cycling troponin for chest pain.  Given the aforementioned, the predictability of an adverse outcome is felt to be significant. I expect that the patient will require at least 2 midnights in the hospital to treat this condition.    John Giovanni MD [facility] Pager [phone]  If 7PM-7AM, please contact night-coverage [email or web address] Password [facility]  01/21/2018, 6:29 AM

## 2018-01-20 NOTE — ED Triage Notes (Signed)
Chest pain and flu symptoms since Thnaksgiving per patient, reports sharp chest pains with leg pain and arm pain. Also reports body aches and eye pain

## 2018-01-20 NOTE — ED Provider Notes (Addendum)
[facility] EMERGENCY DEPARTMENT Provider Note   CSN: [id number] Arrival date & time: 01/20/18  2536     History   Chief Complaint Chief Complaint  Patient presents with  . Chest Pain  . Influenza    HPI Lisa Holt is a 27 y.o. female.  HPI 27 year old female presents with fever.  Started 2 days ago.  She is had multiple other symptoms with it including joint pain, right-sided headache and right-sided eye pain, cough with yellow sputum, shortness of breath, sharp chest pain and bilateral back pain.  She is taken some aspirin and vitamins for this.  No vomiting, diarrhea, neck stiffness or abdominal pain.  No urinary symptoms.  She is been feeling dizzy/lightheaded as well.  Highest temperature has been 104.  Past Medical History:  Diagnosis Date  . Post partum depression    was prescribed meds, took 2, then stopped    Patient Active Problem List   Diagnosis Date Noted  . HSIL (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 08/06/2015    Past Surgical History:  Procedure Laterality Date  . WISDOM TOOTH EXTRACTION       OB History    Gravida  2   Para  2   Term  2   Preterm      AB      Living  2     SAB      TAB      Ectopic      Multiple  0   Live Births  2            Home Medications    Prior to Admission medications   Medication Sig Start Date End Date Taking? Authorizing Provider  etonogestrel (NEXPLANON) 68 MG IMPL implant 1 each by Subdermal route once.    [provider]  ibuprofen (ADVIL,MOTRIN) 600 MG tablet Take 1 tablet (600 mg total) by mouth every 6 (six) hours as needed. 12/09/16   Elpidio Anis, PA-C  Ibuprofen (MIDOL) 200 MG CAPS Take 200 mg by mouth daily.    [provider]  ondansetron (ZOFRAN) 4 MG tablet Take 1 tablet (4 mg total) by mouth every 6 (six) hours. 07/31/17   Isa Rankin, MD    Family History Family History  Problem Relation Age of  Onset  . Hypertension Father   . Diabetes Father   . Stroke Father     Social History Social History   Tobacco Use  . Smoking status: Current Every Day Smoker    Packs/day: 1.00    Types: Cigarettes    Last attempt to quit: 06/05/2013    Years since quitting: 4.6  . Smokeless tobacco: Never Used  Substance Use Topics  . Alcohol use: No    Comment: occ- stopped with pregnancy  . Drug use: No    Types: Marijuana, Cocaine    Comment: las Cocaine use Nov. 2016, last marijuana 02/22/15     Allergies   Latex   Review of Systems Review of Systems  Constitutional: Positive for chills and fever.  HENT: Negative for sore throat.   Respiratory: Positive for cough and shortness of breath.   Cardiovascular: Positive for chest pain.  Gastrointestinal: Negative for abdominal pain, diarrhea and vomiting.  Genitourinary: Negative for dysuria.  Musculoskeletal: Positive for arthralgias and myalgias.  Neurological: Positive for dizziness and headaches.  All other systems reviewed and are negative.    Physical Exam Updated Vital Signs BP (!) 78/38 (  BP Location: Right Arm)   Pulse (!) 106   Temp (!) 103 F (39.4 C) (Oral)   Resp 20   Ht 5\' 3"  (1.6 m)   SpO2 94%   BMI 17.36 kg/m   Physical Exam  Constitutional: She is oriented to person, place, and time. She appears well-developed and well-nourished. She appears ill.  HENT:  Head: Normocephalic and atraumatic.  Right Ear: External ear normal.  Left Ear: External ear normal.  Nose: Nose normal.  Eyes: Pupils are equal, round, and reactive to light. Right eye exhibits no discharge. Left eye exhibits no discharge.  Neck: Normal range of motion. Neck supple.  Cardiovascular: Regular rhythm and normal heart sounds. Tachycardia present.  Pulmonary/Chest: Effort normal and breath sounds normal.  Abdominal: Soft. There is no tenderness. There is CVA tenderness (bilateral).  Musculoskeletal:  No midline back pain/tenderness    Neurological: She is alert and oriented to person, place, and time.  Skin: Skin is warm and dry.  Psychiatric: Her mood appears not anxious.  Nursing note and vitals reviewed.    ED Treatments / Results  Labs (all labs ordered are listed, but only abnormal results are displayed) Labs Reviewed  BASIC METABOLIC PANEL - Abnormal; Notable for the following components:      Result Value   Potassium 3.3 (*)    Creatinine, Ser 1.05 (*)    Calcium 8.5 (*)    All other components within normal limits  CBC - Abnormal; Notable for the following components:   WBC 3.9 (*)    Hemoglobin 11.7 (*)    RDW 15.9 (*)    Platelets 102 (*)    All other components within normal limits  URINALYSIS, ROUTINE W REFLEX MICROSCOPIC - Abnormal; Notable for the following components:   APPearance CLOUDY (*)    Leukocytes, UA LARGE (*)    WBC, UA >50 (*)    Bacteria, UA RARE (*)    All other components within normal limits  HEPATIC FUNCTION PANEL - Abnormal; Notable for the following components:   Total Protein 5.9 (*)    Albumin 3.4 (*)    Alkaline Phosphatase 31 (*)    All other components within normal limits  INFLUENZA PANEL BY PCR (TYPE A & B) - Abnormal; Notable for the following components:   Influenza B By PCR POSITIVE (*)    All other components within normal limits  CULTURE, BLOOD (ROUTINE X 2)  CULTURE, BLOOD (ROUTINE X 2)  URINE CULTURE  I-STAT TROPONIN, ED  I-STAT BETA HCG BLOOD, ED ([facility], [facility], [facility] ONLY)  I-STAT CG4 LACTIC ACID, ED  I-STAT CG4 LACTIC ACID, ED    EKG EKG Interpretation  Date/Time:  Saturday January 20 2018 18:56:49 EST Ventricular Rate:  106 PR Interval:  114 QRS Duration: 76 QT Interval:  278 QTC Calculation: 369 R Axis:   95 Text Interpretation:  Sinus tachycardia Rightward axis ST & T wave abnormality, consider inferior ischemia Abnormal ECG No old tracing to compare Confirmed by Marily Memos [phone]) on 01/20/2018 7:00:32 PM   EKG  Interpretation  Date/Time:  Saturday January 20 2018 23:38:02 EST Ventricular Rate:  81 PR Interval:  114 QRS Duration: 87 QT Interval:  380 QTC Calculation: 442 R Axis:   92 Text Interpretation:  Sinus rhythm Borderline right axis deviation Low voltage, precordial leads Borderline T abnormalities, anterior leads ST/T wave changes improved, tachycardia resolved compared to earlier in the day Confirmed by Pricilla Loveless [phone]) on 01/20/2018 11:44:24 PM  Radiology Dg Chest 2 View  Result Date: 01/20/2018 CLINICAL DATA:  Left-sided chest pain radiating posteriorly with fever and shortness of breath since this morning. EXAM: CHEST - 2 VIEW COMPARISON:  04/04/2017 FINDINGS: The heart size and mediastinal contours are within normal limits. Both lungs are clear. The visualized skeletal structures are unremarkable. IMPRESSION: No active cardiopulmonary disease. Electronically Signed   By: Elberta Fortis M.D.   On: 01/20/2018 20:11    Procedures .Critical Care Performed by: Pricilla Loveless, MD Authorized by: Pricilla Loveless, MD   Critical care provider statement:    Critical care time (minutes):  40   Critical care time was exclusive of:  Separately billable procedures and treating other patients   Critical care was necessary to treat or prevent imminent or life-threatening deterioration of the following conditions:  Sepsis and shock   Critical care was time spent personally by me on the following activities:  Development of treatment plan with patient or surrogate, discussions with consultants, evaluation of patient's response to treatment, examination of patient, obtaining history from patient or surrogate, ordering and performing treatments and interventions, ordering and review of laboratory studies, ordering and review of radiographic studies, pulse oximetry, re-evaluation of patient's condition and review of old charts   (including critical care time)  Medications Ordered in  ED Medications  cefTRIAXone (ROCEPHIN) 2 g in sodium chloride 0.9 % 100 mL IVPB (0 g Intravenous Stopped 01/20/18 2116)  azithromycin (ZITHROMAX) 500 mg in sodium chloride 0.9 % 250 mL IVPB (0 mg Intravenous Stopped 01/20/18 2130)  oseltamivir (TAMIFLU) capsule 75 mg (75 mg Oral Given 01/20/18 2331)  sodium chloride 0.9 % bolus 1,000 mL (1,000 mLs Intravenous New Bag/Given 01/20/18 2259)  acetaminophen (TYLENOL) tablet 650 mg (650 mg Oral Given 01/20/18 1909)  sodium chloride 0.9 % bolus 1,000 mL (0 mLs Intravenous Stopped 01/20/18 2130)    And  sodium chloride 0.9 % bolus 500 mL (0 mLs Intravenous Stopped 01/20/18 2211)  potassium chloride SA (K-DUR,KLOR-CON) CR tablet 40 mEq (40 mEq Oral Given 01/20/18 2129)     Initial Impression / Assessment and Plan / ED Course  I have reviewed the triage vital signs and the nursing notes.  Pertinent labs & imaging results that were available during my care of the patient were reviewed by me and considered in my medical decision making (see chart for details).     Patient symptoms sound viral/flulike.  Chest x-ray negative for pneumonia.  She is hypotensive on arrival and ill-appearing and was given IV fluids, 30 cc/KG.  She does appear better after this and Tylenol for her fever.  She is noted to be positive for influenza B.  Her urinalysis shows large leukocytes and greater than 50 WBC but there is also squamous epithelials.  However given the flank pain I think is reasonable to continue antibiotic treatment and sent for culture.  Her pressures continue to be soft in the 80s and may be 90 systolic.  However her MAP is consistently greater than 65, between 65 and 69.  She is mentating well.  I will continue to give her some IV fluids and she does not appear to have any respiratory issues at this time besides the cough and subjective shortness of breath.  Hospitalist will admit.  They initially asked for ICU consult and I discussed with Dr. Warrick Parisian, who does  not feel she needs ICU care. Also consulted cardiology at hospitalist request for EKG changes seen.  Dr. Deforest Hoyles recommends echo but otherwise this is  likely not primarily cardiac.  Final Clinical Impressions(s) / ED Diagnoses   Final diagnoses:  Influenza B    ED Discharge Orders    None       Pricilla LovelessGoldston, Abdullah Rizzi, MD 01/20/18 [id number]    Pricilla LovelessGoldston, Falon Huesca, MD 01/20/18 2352

## 2018-01-21 ENCOUNTER — Other Ambulatory Visit: Payer: Self-pay

## 2018-01-21 DIAGNOSIS — R079 Chest pain, unspecified: Secondary | ICD-10-CM

## 2018-01-21 DIAGNOSIS — D61818 Other pancytopenia: Secondary | ICD-10-CM

## 2018-01-21 DIAGNOSIS — E876 Hypokalemia: Secondary | ICD-10-CM

## 2018-01-21 DIAGNOSIS — A419 Sepsis, unspecified organism: Secondary | ICD-10-CM

## 2018-01-21 LAB — BASIC METABOLIC PANEL
Anion gap: 5 (ref 5–15)
BUN: 8 mg/dL (ref 6–20)
CO2: 20 mmol/L — ABNORMAL LOW (ref 22–32)
Calcium: 7 mg/dL — ABNORMAL LOW (ref 8.9–10.3)
Chloride: 112 mmol/L — ABNORMAL HIGH (ref 98–111)
Creatinine, Ser: 0.85 mg/dL (ref 0.44–1.00)
GFR calc Af Amer: >60 mL/min (ref >60)
GFR calc non Af Amer: >60 mL/min (ref >60)
Glucose, Bld: 111 mg/dL — ABNORMAL HIGH (ref 70–99)
POTASSIUM: 3.6 mmol/L (ref 3.5–5.1)
Sodium: 137 mmol/L (ref 135–145)

## 2018-01-21 LAB — CBC
HCT: 31.1 % — ABNORMAL LOW (ref 36.0–46.0)
Hemoglobin: 10 g/dL — ABNORMAL LOW (ref 12.0–15.0)
MCH: 27.4 pg (ref 26.0–34.0)
MCHC: 32.2 g/dL (ref 30.0–36.0)
MCV: 85.2 fL (ref 80.0–100.0)
Platelets: 92 10*3/uL — ABNORMAL LOW (ref 150–400)
RBC: 3.65 MIL/uL — AB (ref 3.87–5.11)
RDW: 16 % — ABNORMAL HIGH (ref 11.5–15.5)
WBC: 3.6 10*3/uL — ABNORMAL LOW (ref 4.0–10.5)
nRBC: 0 % (ref 0.0–0.2)

## 2018-01-21 LAB — MAGNESIUM
MAGNESIUM: 1.3 mg/dL — AB (ref 1.7–2.4)
Magnesium: 2.1 mg/dL (ref 1.7–2.4)

## 2018-01-21 LAB — TROPONIN I
Troponin I: <0.03 ng/mL (ref <0.03)
Troponin I: <0.03 ng/mL (ref <0.03)
Troponin I: <0.03 ng/mL (ref <0.03)

## 2018-01-21 LAB — IRON AND TIBC
Iron: 9 ug/dL — ABNORMAL LOW (ref 28–170)
Saturation Ratios: 4 % — ABNORMAL LOW (ref 10.4–31.8)
TIBC: 227 ug/dL — ABNORMAL LOW (ref 250–450)
UIBC: 218 ug/dL

## 2018-01-21 LAB — HIV ANTIBODY (ROUTINE TESTING W REFLEX): HIV Screen 4th Generation wRfx: NONREACTIVE

## 2018-01-21 LAB — FERRITIN: Ferritin: 29 ng/mL (ref 11–307)

## 2018-01-21 LAB — PROCALCITONIN: Procalcitonin: <0.1 ng/mL

## 2018-01-21 MED ORDER — DM-GUAIFENESIN ER 30-600 MG PO TB12
1.0000 | ORAL_TABLET | Freq: Two times a day (BID) | ORAL | Status: DC
  Administered 2018-01-21: 1 via ORAL
  Filled 2018-01-21: qty 1

## 2018-01-21 MED ORDER — SODIUM CHLORIDE 0.9 % IV BOLUS
500.0000 mL | Freq: Once | INTRAVENOUS | Status: AC
  Administered 2018-01-21: 500 mL via INTRAVENOUS

## 2018-01-21 MED ORDER — BENZONATATE 100 MG PO CAPS
100.0000 mg | ORAL_CAPSULE | Freq: Three times a day (TID) | ORAL | Status: DC | PRN
  Administered 2018-01-21 – 2018-01-22 (×3): 100 mg via ORAL
  Filled 2018-01-21 (×3): qty 1

## 2018-01-21 MED ORDER — FERROUS SULFATE 325 (65 FE) MG PO TABS
325.0000 mg | ORAL_TABLET | Freq: Two times a day (BID) | ORAL | Status: DC
  Administered 2018-01-21 – 2018-01-23 (×4): 325 mg via ORAL
  Filled 2018-01-21 (×4): qty 1

## 2018-01-21 MED ORDER — ACETAMINOPHEN 325 MG PO TABS
650.0000 mg | ORAL_TABLET | Freq: Four times a day (QID) | ORAL | Status: DC | PRN
  Administered 2018-01-21 – 2018-01-23 (×5): 650 mg via ORAL
  Filled 2018-01-21 (×5): qty 2

## 2018-01-21 MED ORDER — ENOXAPARIN SODIUM 40 MG/0.4ML ~~LOC~~ SOLN
40.0000 mg | SUBCUTANEOUS | Status: DC
  Administered 2018-01-22: 40 mg via SUBCUTANEOUS
  Filled 2018-01-21: qty 0.4

## 2018-01-21 MED ORDER — SODIUM CHLORIDE 0.9 % IV SOLN
INTRAVENOUS | Status: AC
  Administered 2018-01-21: 01:00:00 via INTRAVENOUS

## 2018-01-21 MED ORDER — ONDANSETRON HCL 4 MG/2ML IJ SOLN
4.0000 mg | Freq: Four times a day (QID) | INTRAMUSCULAR | Status: DC | PRN
  Administered 2018-01-21: 4 mg via INTRAVENOUS
  Filled 2018-01-21: qty 2

## 2018-01-21 MED ORDER — SODIUM CHLORIDE 0.9 % IV BOLUS
1000.0000 mL | Freq: Once | INTRAVENOUS | Status: AC
  Administered 2018-01-21: 1000 mL via INTRAVENOUS

## 2018-01-21 MED ORDER — MAGNESIUM SULFATE 2 GM/50ML IV SOLN
2.0000 g | Freq: Once | INTRAVENOUS | Status: AC
  Administered 2018-01-21: 2 g via INTRAVENOUS
  Filled 2018-01-21: qty 50

## 2018-01-21 MED ORDER — ENOXAPARIN SODIUM 30 MG/0.3ML ~~LOC~~ SOLN
30.0000 mg | SUBCUTANEOUS | Status: DC
  Administered 2018-01-21: 30 mg via SUBCUTANEOUS
  Filled 2018-01-21: qty 0.3

## 2018-01-21 MED ORDER — ACETAMINOPHEN 650 MG RE SUPP: 650.0000 mg | Freq: Four times a day (QID) | RECTAL | Status: DC | PRN

## 2018-01-21 NOTE — Progress Notes (Addendum)
PROGRESS NOTE  Lisa Holt ZOX:[id number] DOB: 05/04/1990 DOA: 01/20/2018 PCP: [facility]  HPI/Recap of past 24 hours: Lisa Holt is a 27 y.o. female with medical history significant of postpartum depression presenting to the hospital for evaluation of chest pain and flu symptoms.  Patient reports having a 2-day history of back and joint pains, eye muscles tightening up, and chest pain.  She describes the chest pain as both stabbing and heaviness at rest which is intermittent in nature.  Also reports having fevers and chills.  She has been feeling dizzy.  Her p.o. intake has been reduced.  She has been nauseous and has been vomiting.  No recent sick contacts.  Denies having any dysuria, urinary frequency, or urgency.  States she has been told before she is anemic and reports having menorrhagia for which she is currently on an OCP. Temperature 103 F, hypotensive (blood pressure 78/38 arrival).  Tachycardic on admission.  Admitted for sepsis secondary to influenza B infection  01/21/2018: Patient seen and examined at bedside.  States she feels better.  Cough and breathing have improved.    Assessment/Plan: Principal Problem:   Influenza due to influenza virus, type B Active Problems:   Sepsis (HCC)   Chest pain   Pancytopenia (HCC)   Hypokalemia   Hypomagnesemia  Sepsis secondary to influenza B infection Presented with 2 days of flulike symptoms, fever with T-max 103 Positive influenza B PCR Started on Tamiflu 75 mg twice daily x5 days  Chest pain improving Likely related to demand ischemia from sepsis and influenza infection.  Initial EKG showing mild ST depressions in leads II, III, aVF, V3, V5, and V6. Repeat EKG showing resolution of these findings.  Troponin x 3 negative.   Pancytopenia, unclear etiology -Outpatient GYN follow-up for menorrhagia   Iron deficiency anemia Iron studies revealed significant iron deficiency Start ferrous sulfate 325 twice  daily Follow-up with PCP outpatient  Hypokalemia/hypomagnesemia, resolved post repletion   DVT prophylaxis:  Subcu Lovenox daily Code Status: Full code Family Communication: No family available Disposition Plan: Anticipate discharge after clinical improvement. Consults called: None       Objective: Vitals:   01/21/18 0300 01/21/18 0302 01/21/18 0431 01/21/18 0744  BP: (!) 85/74 (!) 86/56 (!) 111/96 91/64  Pulse: 92 84 87 73  Resp: (!) 21 (!) 31  11  Temp:   (!) 100.4 F (38 C) 98.6 F (37 C)  TempSrc:   Oral Oral  SpO2: 95% 100% 97% 96%  Weight:      Height:        Intake/Output Summary (Last 24 hours) at 01/21/2018 1803 Last data filed at 01/21/2018 1501 Gross per 24 hour  Intake 4332.1 ml  Output -  Net 4332.1 ml   Filed Weights   01/21/18 0028 01/21/18 0100 01/21/18 0104  Weight: 44.5 kg 47.3 kg 47.3 kg    Exam:  . General: 27 y.o. year-old female well developed well nourished in no acute distress.  Alert and oriented x3. . Cardiovascular: Regular rate and rhythm with no rubs or gallops.  No thyromegaly or JVD noted.   Marland Kitchen Respiratory: Clear to auscultation with no wheezes or rales. Good inspiratory effort. . Abdomen: Soft nontender nondistended with normal bowel sounds x4 quadrants. . Musculoskeletal: No lower extremity edema. 2/4 pulses in all 4 extremities. . Skin: No ulcerative lesions noted or rashes, . Psychiatry: Mood is appropriate for condition and setting   Data Reviewed: CBC: Recent Labs  Lab 01/20/18 1910  01/21/18 0948  WBC 3.9* 3.6*  HGB 11.7* 10.0*  HCT 37.5 31.1*  MCV 86.0 85.2  PLT 102* 92*   Basic Metabolic Panel: Recent Labs  Lab 01/20/18 1910 01/21/18 0044 01/21/18 0948  NA 137  --  137  K 3.3*  --  3.6  CL 103  --  112*  CO2 26  --  20*  GLUCOSE 95  --  111*  BUN 8  --  8  CREATININE 1.05*  --  0.85  CALCIUM 8.5*  --  7.0*  MG  --  1.3* 2.1   GFR: Estimated Creatinine Clearance: 76.2 mL/min (by C-G formula based  on SCr of 0.85 mg/dL). Liver Function Tests: Recent Labs  Lab 01/20/18 1956  AST 18  ALT 13  ALKPHOS 31*  BILITOT 0.5  PROT 5.9*  ALBUMIN 3.4*   No results for input(s): LIPASE, AMYLASE in the last 168 hours. No results for input(s): AMMONIA in the last 168 hours. Coagulation Profile: No results for input(s): INR, PROTIME in the last 168 hours. Cardiac Enzymes: Recent Labs  Lab 01/21/18 0044 01/21/18 0948 01/21/18 1328  TROPONINI <0.03 <0.03 <0.03   BNP (last 3 results) No results for input(s): PROBNP in the last 8760 hours. HbA1C: No results for input(s): HGBA1C in the last 72 hours. CBG: No results for input(s): GLUCAP in the last 168 hours. Lipid Profile: No results for input(s): CHOL, HDL, LDLCALC, TRIG, CHOLHDL, LDLDIRECT in the last 72 hours. Thyroid Function Tests: No results for input(s): TSH, T4TOTAL, FREET4, T3FREE, THYROIDAB in the last 72 hours. Anemia Panel: Recent Labs    01/21/18 0948  FERRITIN 29  TIBC 227*  IRON 9*   Urine analysis:    Component Value Date/Time   COLORURINE YELLOW 01/20/2018 2053   APPEARANCEUR CLOUDY (A) 01/20/2018 2053   LABSPEC 1.026 01/20/2018 2053   PHURINE 5.0 01/20/2018 2053   GLUCOSEU NEGATIVE 01/20/2018 2053   HGBUR NEGATIVE 01/20/2018 2053   BILIRUBINUR NEGATIVE 01/20/2018 2053   KETONESUR NEGATIVE 01/20/2018 2053   PROTEINUR NEGATIVE 01/20/2018 2053   UROBILINOGEN 1.0 08/27/2015 1351   NITRITE NEGATIVE 01/20/2018 2053   LEUKOCYTESUR LARGE (A) 01/20/2018 2053   Sepsis Labs: @LABRCNTIP (procalcitonin:4,lacticidven:4)  ) Recent Results (from the past 240 hour(s))  Blood Culture (routine x 2)     Status: None (Preliminary result)   Collection Time: 01/20/18  7:38 PM  Result Value Ref Range Status   Specimen Description BLOOD RIGHT ANTECUBITAL  Final   Special Requests   Final    BOTTLES DRAWN AEROBIC AND ANAEROBIC Blood Culture adequate volume   Culture   Final    NO GROWTH < 24 HOURS Performed at [facility], [address], [place], [place] [address]    Report Status PENDING  Incomplete  Blood Culture (routine x 2)     Status: None (Preliminary result)   Collection Time: 01/20/18  8:19 PM  Result Value Ref Range Status   Specimen Description BLOOD LEFT HAND  Final   Special Requests   Final    BOTTLES DRAWN AEROBIC AND ANAEROBIC Blood Culture results may not be optimal due to an excessive volume of blood received in culture bottles   Culture   Final    NO GROWTH < 24 HOURS Performed at [facility], [address], [place], [place] [address]    Report Status PENDING  Incomplete      Studies: Dg Chest 2 View  Result Date: 01/20/2018 CLINICAL DATA:  Left-sided chest pain radiating posteriorly with fever and shortness of breath since this morning. EXAM: CHEST - 2 VIEW COMPARISON:  04/04/2017 FINDINGS: The heart size and mediastinal contours are within normal limits. Both lungs are clear. The visualized skeletal structures are unremarkable. IMPRESSION: No active cardiopulmonary disease. Electronically Signed   By: Elberta Fortisaniel  Boyle M.D.   On: 01/20/2018 20:11    Scheduled Meds: . [START ON 01/22/2018] enoxaparin (LOVENOX) injection  40 mg Subcutaneous Q24H  . oseltamivir  75 mg Oral BID    Continuous Infusions:   LOS: 1 day     Darlin Droparole N , MD [facility] Pager [phone]  If 7PM-7AM, please contact night-coverage [email or web address] Password [email or web address] 01/21/2018, 6:03 PM

## 2018-01-22 LAB — URINE CULTURE: Culture: <10000 — AB

## 2018-01-22 LAB — RAPID URINE DRUG SCREEN, HOSP PERFORMED
Amphetamines: NOT DETECTED
BENZODIAZEPINES: NOT DETECTED
Barbiturates: NOT DETECTED
Cocaine: NOT DETECTED
Opiates: NOT DETECTED
Tetrahydrocannabinol: NOT DETECTED

## 2018-01-22 LAB — TROPONIN I: Troponin I: <0.03 ng/mL (ref <0.03)

## 2018-01-22 NOTE — Evaluation (Signed)
Physical Therapy Evaluation and Discharge Patient Details Name: Lisa Holt MRN: [id number] DOB: 07-12-1990 Today's Date: 01/22/2018   History of Present Illness  Pt is a 27 y/o female admitted secondary to Sepsis from influenza. Pt also with chest pain, however, troponins negative. PMH includes substance abuse and post partum depression.   Clinical Impression  Patient evaluated by Physical Therapy with no further acute PT needs identified. All education has been completed and the patient has no further questions. Pt overall independent with mobility tasks. Scored 24 on DGI indicating low fall risk. Pt has family and friends that can assist as needed at d/c.  See below for any follow-up Physical Therapy or equipment needs. PT is signing off. Thank you for this referral. If needs change, please re-consult.      Follow Up Recommendations No PT follow up    Equipment Recommendations  None recommended by PT    Recommendations for Other Services       Precautions / Restrictions Precautions Precautions: None Restrictions Weight Bearing Restrictions: No      Mobility  Bed Mobility Overal bed mobility: Independent                Transfers Overall transfer level: Independent                  Ambulation/Gait Ambulation/Gait assistance: Independent Gait Distance (Feet): 200 Feet Assistive device: None Gait Pattern/deviations: WFL(Within Functional Limits) Gait velocity: WFL Gait velocity interpretation: >4.37 ft/sec, indicative of normal walking speed General Gait Details: Able to perform dynamic gait tasks without LOB.   Stairs Stairs: Yes Stairs assistance: Independent Stair Management: No rails;Alternating pattern;Forwards Number of Stairs: 3 General stair comments: Steady stair navigation. No LOB noted.   Wheelchair Mobility    Modified Rankin (Stroke Patients Only)       Balance Overall balance assessment: Independent                                Standardized Balance Assessment Standardized Balance Assessment : Dynamic Gait Index   Dynamic Gait Index Level Surface: Normal Change in Gait Speed: Normal Gait with Horizontal Head Turns: Normal Gait with Vertical Head Turns: Normal Gait and Pivot Turn: Normal Step Over Obstacle: Normal Step Around Obstacles: Normal Steps: Normal Total Score: 24       Pertinent Vitals/Pain Pain Assessment: No/denies pain    Home Living Family/patient expects to be discharged to:: Private residence Living Arrangements: (unspecified friends and family ) Available Help at Discharge: Family;Available 24 hours/day;Friend(s) Type of Home: House Home Access: Stairs to enter Entrance Stairs-Rails: Right Entrance Stairs-Number of Steps: 3 Home Layout: Two level;Able to live on main level with bedroom/bathroom Home Equipment: None      Prior Function Level of Independence: Independent               Hand Dominance        Extremity/Trunk Assessment   Upper Extremity Assessment Upper Extremity Assessment: Overall WFL for tasks assessed    Lower Extremity Assessment Lower Extremity Assessment: Overall WFL for tasks assessed    Cervical / Trunk Assessment Cervical / Trunk Assessment: Normal  Communication   Communication: No difficulties  Cognition Arousal/Alertness: Awake/alert Behavior During Therapy: WFL for tasks assessed/performed Overall Cognitive Status: Within Functional Limits for tasks assessed  General Comments      Exercises     Assessment/Plan    PT Assessment Patent does not need any further PT services  PT Problem List         PT Treatment Interventions      PT Goals (Current goals can be found in the Care Plan section)  Acute Rehab PT Goals Patient Stated Goal: to go home PT Goal Formulation: With patient Time For Goal Achievement: 01/22/18 Potential to Achieve Goals: Good     Frequency     Barriers to discharge        Co-evaluation               AM-PAC PT "6 Clicks" Mobility  Outcome Measure Help needed turning from your back to your side while in a flat bed without using bedrails?: None Help needed moving from lying on your back to sitting on the side of a flat bed without using bedrails?: None Help needed moving to and from a bed to a chair (including a wheelchair)?: None Help needed standing up from a chair using your arms (e.g., wheelchair or bedside chair)?: None Help needed to walk in hospital room?: None Help needed climbing 3-5 steps with a railing? : None 6 Click Score: 24    End of Session   Activity Tolerance: Patient tolerated treatment well Patient left: in bed;with call bell/phone within reach Nurse Communication: Mobility status PT Visit Diagnosis: Other abnormalities of gait and mobility (R26.89)    Time: [id number] PT Time Calculation (min) (ACUTE ONLY): 10 min   Charges:   PT Evaluation $PT Eval Low Complexity: 1 Low          Gladys DammeBrittany Roshelle Traub, PT, DPT  Acute Rehabilitation Services  Pager: [phone] Office: [phone]   Lehman PromBrittany S Sandia Pfund 01/22/2018, 6:19 PM

## 2018-01-22 NOTE — Progress Notes (Addendum)
PROGRESS NOTE  Lisa DubonnetSabrina N Lem ZOX:[id number] DOB: May 29, 1990 DOA: 01/20/2018 PCP: [facility]  HPI/Recap of past 24 hours: Lisa Holt is a 27 y.o. female with medical history significant of postpartum depression presenting to the hospital for evaluation of chest pain and flu symptoms.  Patient reports having a 2-day history of back and joint pains, eye muscles tightening up, and chest pain.  She describes the chest pain as both stabbing and heaviness at rest which is intermittent in nature.  Also reports having fevers and chills.  She has been feeling dizzy.  Her p.o. intake has been reduced.  She has been nauseous and has been vomiting.  No recent sick contacts.  Denies having any dysuria, urinary frequency, or urgency.  States she has been told before she is anemic and reports having menorrhagia for which she is currently on an OCP. Temperature 103 F, hypotensive (blood pressure 78/38 arrival).  Tachycardic on admission.  Admitted for sepsis secondary to influenza B infection  01/21/2018: Patient seen and examined at bedside.  States she feels better.  Cough and breathing have improved.  01/22/18: Patient seen and examined at her bedside.  Reports intermittent chest pain.  Admits to prior use of cocaine and marijuana.  None recently.  UDS negative.  Twelve-lead EKG showed sinus bradycardia with rate of 56.  Had 3 sets of troponin done yesterday and all negative.  Obtain 1 set of troponin.  O2 saturation 100% on room air.   Assessment/Plan: Principal Problem:   Influenza due to influenza virus, type B Active Problems:   Sepsis (HCC)   Chest pain   Pancytopenia (HCC)   Hypokalemia   Hypomagnesemia  Sepsis secondary to influenza B infection Presented with 2 days of flulike symptoms, fever with T-max 103, RR 28 Positive influenza B PCR Started on Tamiflu 75 mg twice daily x5 days C/w Tamiflu  Chest pain recurrent Troponin x 3 negative.  Independently reviewed twelve-lead  EKG done on 01/22/2018 revealed sinus bradycardia rate of 56 Obtain 1 set of troponin  Pancytopenia, unclear etiology -Outpatient GYN follow-up for menorrhagia  -Obtain peripheral smear  Iron deficiency anemia Iron studies revealed significant iron deficiency Continue ferrous sulfate 325 twice daily Follow-up with PCP outpatient  Hypokalemia/hypomagnesemia, resolved post repletion  History of polysubstance abuse including marijuana and cocaine UDS negative on 01/22/2018   DVT prophylaxis:  Subcu Lovenox daily Code Status: Full code Family Communication: No family available Disposition Plan:  Discharge possibly tomorrow 01/23/2018 Consults called: None       Objective: Vitals:   01/22/18 0353 01/22/18 0354 01/22/18 0811 01/22/18 1131  BP: 92/63 99/66 111/69 95/71  Pulse:  66 61   Resp:  19  18  Temp:  98.4 F (36.9 C)    TempSrc:  Oral Oral   SpO2:  99% 94% 100%  Weight:  55.5 kg    Height:        Intake/Output Summary (Last 24 hours) at 01/22/2018 1351 Last data filed at 01/22/2018 1234 Gross per 24 hour  Intake 1062 ml  Output -  Net 1062 ml   Filed Weights   01/21/18 0100 01/21/18 0104 01/22/18 0354  Weight: 47.3 kg 47.3 kg 55.5 kg    Exam:  . General: 27 y.o. year-old female developed well-nourished in no acute distress.  Alert and oriented x3.   . Cardiovascular: Regular rate and rhythm with no rubs or gallops.  No JVD or thyromegaly noted.   Marland Kitchen. Respiratory: Clear to auscultation with  no wheezes or rales.  Good respiratory effort. . Abdomen: Soft nontender nondistended with normal bowel sounds x4 quadrants. . Musculoskeletal: No lower extremity edema. 2/4 pulses in all 4 extremities. . Skin: No ulcerative lesions noted or rashes, . Psychiatry: Mood is appropriate for condition and setting   Data Reviewed: CBC: Recent Labs  Lab 01/20/18 1910 01/21/18 0948  WBC 3.9* 3.6*  HGB 11.7* 10.0*  HCT 37.5 31.1*  MCV 86.0 85.2  PLT 102* 92*   Basic  Metabolic Panel: Recent Labs  Lab 01/20/18 1910 01/21/18 0044 01/21/18 0948  NA 137  --  137  K 3.3*  --  3.6  CL 103  --  112*  CO2 26  --  20*  GLUCOSE 95  --  111*  BUN 8  --  8  CREATININE 1.05*  --  0.85  CALCIUM 8.5*  --  7.0*  MG  --  1.3* 2.1   GFR: Estimated Creatinine Clearance: 84.4 mL/min (by C-G formula based on SCr of 0.85 mg/dL). Liver Function Tests: Recent Labs  Lab 01/20/18 1956  AST 18  ALT 13  ALKPHOS 31*  BILITOT 0.5  PROT 5.9*  ALBUMIN 3.4*   No results for input(s): LIPASE, AMYLASE in the last 168 hours. No results for input(s): AMMONIA in the last 168 hours. Coagulation Profile: No results for input(s): INR, PROTIME in the last 168 hours. Cardiac Enzymes: Recent Labs  Lab 01/21/18 0044 01/21/18 0948 01/21/18 1328  TROPONINI <0.03 <0.03 <0.03   BNP (last 3 results) No results for input(s): PROBNP in the last 8760 hours. HbA1C: No results for input(s): HGBA1C in the last 72 hours. CBG: No results for input(s): GLUCAP in the last 168 hours. Lipid Profile: No results for input(s): CHOL, HDL, LDLCALC, TRIG, CHOLHDL, LDLDIRECT in the last 72 hours. Thyroid Function Tests: No results for input(s): TSH, T4TOTAL, FREET4, T3FREE, THYROIDAB in the last 72 hours. Anemia Panel: Recent Labs    01/21/18 0948  FERRITIN 29  TIBC 227*  IRON 9*   Urine analysis:    Component Value Date/Time   COLORURINE YELLOW 01/20/2018 2053   APPEARANCEUR CLOUDY (A) 01/20/2018 2053   LABSPEC 1.026 01/20/2018 2053   PHURINE 5.0 01/20/2018 2053   GLUCOSEU NEGATIVE 01/20/2018 2053   HGBUR NEGATIVE 01/20/2018 2053   BILIRUBINUR NEGATIVE 01/20/2018 2053   KETONESUR NEGATIVE 01/20/2018 2053   PROTEINUR NEGATIVE 01/20/2018 2053   UROBILINOGEN 1.0 08/27/2015 1351   NITRITE NEGATIVE 01/20/2018 2053   LEUKOCYTESUR LARGE (A) 01/20/2018 2053   Sepsis Labs: @LABRCNTIP (procalcitonin:4,lacticidven:4)  ) Recent Results (from the past 240 hour(s))  Blood Culture  (routine x 2)     Status: None (Preliminary result)   Collection Time: 01/20/18  7:38 PM  Result Value Ref Range Status   Specimen Description BLOOD RIGHT ANTECUBITAL  Final   Special Requests   Final    BOTTLES DRAWN AEROBIC AND ANAEROBIC Blood Culture adequate volume   Culture   Final    NO GROWTH 2 DAYS Performed at [facility], [address], [place], [place] [address]    Report Status PENDING  Incomplete  Blood Culture (routine x 2)     Status: None (Preliminary result)   Collection Time: 01/20/18  8:19 PM  Result Value Ref Range Status   Specimen Description BLOOD LEFT HAND  Final   Special Requests   Final    BOTTLES DRAWN AEROBIC AND ANAEROBIC Blood Culture results may not be optimal due to an excessive volume  of blood received in culture bottles   Culture   Final    NO GROWTH 2 DAYS Performed at [facility], [address], [place], [place] [address]    Report Status PENDING  Incomplete  Urine culture     Status: Abnormal   Collection Time: 01/20/18  8:53 PM  Result Value Ref Range Status   Specimen Description URINE, RANDOM  Final   Special Requests NONE  Final   Culture (A)  Final    <10,000 COLONIES/mL INSIGNIFICANT GROWTH Performed at [facility], [address] [place], [place] [address]    Report Status 01/22/2018 FINAL  Final      Studies: No results found.  Scheduled Meds: . enoxaparin (LOVENOX) injection  40 mg Subcutaneous Q24H  . ferrous sulfate  325 mg Oral BID WC  . oseltamivir  75 mg Oral BID    Continuous Infusions:   LOS: 2 days     Darlin Drop, MD [facility] Pager [phone]  If 7PM-7AM, please contact night-coverage [email or web address] Password [email or web address] 01/22/2018, 1:51 PM

## 2018-01-22 NOTE — Progress Notes (Signed)
Patient IV removed. Patient c/o pain on IV site. MD notified and said it is OK not to place new IV. Will continue to monitor patient.

## 2018-01-22 NOTE — Progress Notes (Addendum)
Lisa Holt, [facility] [phone] BP=92/63 on right arm and 99/66 on left arm. HR was 49-54. Pt was diaphoretic; denied CP and SOB. Current HR=76 NP X.Blount was made aware.   (0420 am) X. Blount, NP said to continue to monitor pt; no further intervention right now.

## 2018-01-23 MED ORDER — OSELTAMIVIR PHOSPHATE 75 MG PO CAPS: 75.0000 mg | ORAL_CAPSULE | Freq: Two times a day (BID) | ORAL | 0 refills | Status: AC

## 2018-01-23 MED ORDER — FERROUS SULFATE 325 (65 FE) MG PO TABS: 325.0000 mg | ORAL_TABLET | Freq: Two times a day (BID) | ORAL | 0 refills | Status: DC

## 2018-01-23 MED ORDER — BENZONATATE 100 MG PO CAPS: 100.0000 mg | ORAL_CAPSULE | Freq: Three times a day (TID) | ORAL | 0 refills | Status: DC | PRN

## 2018-01-23 MED ORDER — OSELTAMIVIR PHOSPHATE 75 MG PO CAPS
75.0000 mg | ORAL_CAPSULE | Freq: Two times a day (BID) | ORAL | Status: DC
  Administered 2018-01-23: 75 mg via ORAL
  Filled 2018-01-23: qty 1

## 2018-01-23 MED ORDER — ONDANSETRON HCL 4 MG PO TABS: 4.0000 mg | ORAL_TABLET | Freq: Two times a day (BID) | ORAL | 0 refills | Status: DC | PRN

## 2018-01-23 NOTE — Discharge Instructions (Addendum)
Preventing Influenza, Adult Influenza, more commonly known as the flu, is a viral infection that mainly affects the respiratory tract. The respiratory tract includes structures that help you breathe, such as the lungs, nose, and throat. The flu causes many common cold symptoms, as well as a high fever and body aches. The flu spreads easily from person to person (is contagious). The flu is most common from December through March. This is called flu season.You can catch the flu virus by:  Breathing in droplets from an infected person's cough or sneeze.  Touching something that was recently contaminated with the virus and then touching your mouth, nose, or eyes.  What can I do to lower my risk? You can decrease your risk of getting the flu by:  Getting a flu shot (influenza vaccination) every year. This is the best way to prevent the flu. A flu shot is recommended for everyone age 25 months and older. ? It is best to get a flu shot in the fall, as soon as it is available. Getting a flu shot during winter or spring instead is still a good idea. Flu season can last into early spring. ? Preventing the flu through vaccination requires getting a new flu shot every year. This is because the flu virus changes slightly (mutates) from one year to the next. Even if a flu shot does not completely protect you from all flu virus mutations, it can reduce the severity of your illness and prevent dangerous complications of the flu. ? If you are pregnant, you can and should get a flu shot. ? If you have had a reaction to the shot in the past or if you are allergic to eggs, check with your health care provider before getting a flu shot. ? Sometimes the vaccine is available as a nasal spray. In some years, the nasal spray has not been as effective against the flu virus. Check with your health care provider if you have questions about this.  Practicing good health habits. This is especially important during flu  season. ? Avoid contact with people who are sick with flu or cold symptoms. ? Wash your hands with soap and water often. If soap and water are not available, use hand sanitizer. ? Avoid touching your hands to your face, especially when you have not washed your hands recently. ? Use a disinfectant to clean surfaces at home and at work that may be contaminated with the flu virus. ? Keep your bodys disease-fighting system (immune system) in good shape by eating a healthy diet, drinking plenty of fluids, getting enough sleep, and exercising regularly.  If you do get the flu, avoid spreading it to others by:  Staying home until your symptoms have been gone for at least one day.  Covering your mouth and nose with your elbow when you cough or sneeze.  Avoiding close contact with others, especially babies and elderly people.  Why are these changes important? Getting a flu shot and practicing good health habits protects you as well as other people. If you get the flu, your friends, family, and co-workers are also at risk of getting it, because it spreads so easily to others. Each year, about 2 out of every 10 people get the flu. Having the flu can lead to complications, such as pneumonia, ear infection, and sinus infection. The flu also can be deadly, especially for babies, people older than age 35, and people who have serious long-term diseases. How is this treated? Most people  recover from the flu by resting at home and drinking plenty of fluids. However, a prescription antiviral medicine may reduce your flu symptoms and may make your flu go away sooner. This medicine must be started within a few days of getting flu symptoms. You can talk with your health care provider about whether you need an antiviral medicine. Antiviral medicine may be prescribed for people who are at risk for more serious flu symptoms. This includes people who:  Are older than age 79.  Are pregnant.  Have a condition that  makes the flu worse or more dangerous.  Where to find more information:  Centers for Disease Control and Prevention: tsavxtf.com  ItsBlog.fr: InternetEnthusiasts.hu  American Academy of Family Physicians: familydoctor.org/familydoctor/en/kids/vaccines/preventing-the-flu.html Contact a health care provider if:  You have influenza and you develop new symptoms.  You have: ? Chest pain. ? Diarrhea. ? A fever.  Your cough gets worse, or you produce more mucus. Summary  The best way to prevent the flu is to get a flu shot every year in the fall.  Even if you get the flu after you have received the yearly vaccine, your flu may be milder and go away sooner because of your flu shot.  If you get the flu, antiviral medicines that are started with a few days of symptoms may reduce your flu symptoms and may make your flu go away sooner.  You can also help prevent the flu by practicing good health habits. This information is not intended to replace advice given to you by your health care provider. Make sure you discuss any questions you have with your health care provider. Document Released: 02/22/2015 Document Revised: 10/17/2015 Document Reviewed: 10/17/2015 Elsevier Interactive Patient Education  2018 [employer]. Influenza, Adult Influenza (the flu") is an infection in the lungs, nose, and throat (respiratory tract). It is caused by a virus. The flu causes many common cold symptoms, as well as a high fever and body aches. It can make you feel very sick. The flu spreads easily from person to person (is contagious). Getting a flu shot (influenza vaccination) every year is the best way to prevent the flu. Follow these instructions at home:  Take over-the-counter and prescription medicines only as told by your doctor.  Use a cool mist humidifier to add moisture (humidity) to the air in your home. This can make it easier to breathe.  Rest as needed.  Drink enough  fluid to keep your pee (urine) clear or pale yellow.  Cover your mouth and nose when you cough or sneeze.  Wash your hands with soap and water often, especially after you cough or sneeze. If you cannot use soap and water, use hand sanitizer.  Stay home from work or school as told by your doctor. Unless you are visiting your doctor, try to avoid leaving home until your fever has been gone for 24 hours without the use of medicine.  Keep all follow-up visits as told by your doctor. This is important. How is this prevented?  Getting a yearly (annual) flu shot is the best way to avoid getting the flu. You may get the flu shot in late summer, fall, or winter. Ask your doctor when you should get your flu shot.  Wash your hands often or use hand sanitizer often.  Avoid contact with people who are sick during cold and flu season.  Eat healthy foods.  Drink plenty of fluids.  Get enough sleep.  Exercise regularly. Contact a doctor if:  You  get new symptoms.  You have: ? Chest pain. ? Watery poop (diarrhea). ? A fever.  Your cough gets worse.  You start to have more mucus.  You feel sick to your stomach (nauseous).  You throw up (vomit). Get help right away if:  You start to be short of breath or have trouble breathing.  Your skin or nails turn a bluish color.  You have very bad pain or stiffness in your neck.  You get a sudden headache.  You get sudden pain in your face or ear.  You cannot stop throwing up. This information is not intended to replace advice given to you by your health care provider. Make sure you discuss any questions you have with your health care provider. Document Released: 11/17/2007 Document Revised: 07/16/2015 Document Reviewed: 12/02/2014 Elsevier Interactive Patient Education  2017 [employer]

## 2018-01-23 NOTE — Discharge Summary (Signed)
Discharge Summary  Lisa Holt:[id number] DOB: 1990-04-03  PCP: [facility]  Admit date: 01/20/2018 Discharge date: 01/23/2018  Time spent: 35 minutes  Recommendations for Outpatient Follow-up:  1. Follow-up with your PCP 2. Follow-up with your OB/GYN 3. Take your medications as prescribed  Discharge Diagnoses:  Active Hospital Problems   Diagnosis Date Noted  . Influenza due to influenza virus, type B 01/20/2018  . Sepsis (HCC) 01/21/2018  . Chest pain 01/21/2018  . Pancytopenia (HCC) 01/21/2018  . Hypokalemia 01/21/2018  . Hypomagnesemia 01/21/2018    Resolved Hospital Problems  No resolved problems to display.    Discharge Condition: Stable  Diet recommendation: Resume previous diet  Vitals:   01/23/18 0446 01/23/18 0802  BP: (!) 95/56 100/61  Pulse: 65 71  Resp: 18 18  Temp: 98.7 F (37.1 C) 98.6 F (37 C)  SpO2: 99% 99%    History of present illness:  Lisa Holt a 27 y.o.femalewith medical history significant ofpostpartum depression presenting to the hospital for evaluation of chest pain and flu symptoms.Patient reports having a 2-day history of back and joint pains, eye muscles tightening up, and chest pain. She describes the chest pain as both stabbing and heaviness at rest which is intermittent in nature. Also reports having fevers and chills. She has been feeling dizzy. Her p.o. intake has been reduced. She has been nauseous and has been vomiting. No recent sick contacts. Denies having any dysuria, urinary frequency, or urgency. States she has been told before she is anemic and reports having menorrhagia for which she is currently on an OCP.Temperature 103 F,hypotensive (blood pressure 78/38 arrival).Tachycardic on admission.  Admitted for sepsis secondary to influenza B infection.  Hospital course complicated by intermittent chest pain.  Twelve-lead EKG unrevealing.  Troponin negative x4.  O2 saturation 100% on room  air.  Chest x-ray unremarkable.  01/23/2018: Patient seen and examined at her bedside.  No acute events overnight.  On the day of discharge, the patient was hemodynamically stable.  She will need to follow-up with her PCP and OB/GYN posthospitalization.   Hospital Course:  Principal Problem:   Influenza due to influenza virus, type B Active Problems:   Sepsis (HCC)   Chest pain   Pancytopenia (HCC)   Hypokalemia   Hypomagnesemia  Resolving sepsis secondary to influenza B infection Sepsis physiology is resolving Presented with 2 days of flulike symptoms, fever with T-max 103, RR 28 Positive influenza B PCR Continue on Tamiflu 75 mg twice daily x5 days Follow-up with your PCP  Chest pain recurrent, suspect musculoskeletal etiology Troponin x 3 negative.  Independently reviewed twelve-lead EKG done on 01/22/2018 revealed sinus bradycardia rate of 56 Troponin negative x4 Negative orthostatic vital signs  Pancytopenia, unclear etiology -Outpatient GYN follow-upfor menorrhagia -Follow-up with your PCP posthospitalization   Iron deficiency anemia Iron studies revealed significant iron deficiency Continue ferrous sulfate 325 twice daily Follow-up with PCP outpatient  Hypokalemia/hypomagnesemia, resolved post repletion  Prior history of polysubstance abuse including marijuana and cocaine UDS negative on 01/22/2018   Code Status:Full code    Discharge Exam: BP 100/61 (BP Location: Right Arm)   Pulse 71   Temp 98.6 F (37 C) (Oral)   Resp 18   Ht 5\' 3"  (1.6 m)   Wt 43.6 kg   SpO2 99%   BMI 17.02 kg/m  . General: 27 y.o. year-old female well developed well nourished in no acute distress.  Alert and oriented x3. . Cardiovascular: Regular rate and rhythm with  no rubs or gallops.  No thyromegaly or JVD noted.   Marland Kitchen Respiratory: Clear to auscultation with no wheezes or rales. Good inspiratory effort. . Abdomen: Soft nontender nondistended with normal bowel  sounds x4 quadrants. . Musculoskeletal: No lower extremity edema. 2/4 pulses in all 4 extremities. . Skin: No ulcerative lesions noted or rashes, . Psychiatry: Mood is appropriate for condition and setting  Discharge Instructions You were cared for by a hospitalist during your hospital stay. If you have any questions about your discharge medications or the care you received while you were in the hospital after you are discharged, you can call the unit and asked to speak with the hospitalist on call if the hospitalist that took care of you is not available. Once you are discharged, your primary care physician will handle any further medical issues. Please note that NO REFILLS for any discharge medications will be authorized once you are discharged, as it is imperative that you return to your primary care physician (or establish a relationship with a primary care physician if you do not have one) for your aftercare needs so that they can reassess your need for medications and monitor your lab values.   Allergies as of 01/23/2018      Reactions   Latex Itching, Swelling      Medication List    STOP taking these medications   diphenhydrAMINE 25 mg capsule Commonly known as:  BENADRYL   ibuprofen 600 MG tablet Commonly known as:  ADVIL,MOTRIN     TAKE these medications   benzonatate 100 MG capsule Commonly known as:  TESSALON Take 1 capsule (100 mg total) by mouth 3 (three) times daily as needed for cough.   ferrous sulfate 325 (65 FE) MG tablet Take 1 tablet (325 mg total) by mouth 2 (two) times daily with a meal.   ondansetron 4 MG tablet Commonly known as:  ZOFRAN Take 1 tablet (4 mg total) by mouth 2 (two) times daily as needed for nausea or vomiting. What changed:    when to take this  reasons to take this   oseltamivir 75 MG capsule Commonly known as:  TAMIFLU Take 1 capsule (75 mg total) by mouth 2 (two) times daily for 3 days.      Allergies  Allergen Reactions  .  Latex Itching and Swelling   Follow-up Information    [facility]. Call in 1 day(s).   Why:  please call for a post hospital follow-up appointment. Contact information: [address] [place] [address] [phone]            The results of significant diagnostics from this hospitalization (including imaging, microbiology, ancillary and laboratory) are listed below for reference.    Significant Diagnostic Studies: Dg Chest 2 View  Result Date: 01/20/2018 CLINICAL DATA:  Left-sided chest pain radiating posteriorly with fever and shortness of breath since this morning. EXAM: CHEST - 2 VIEW COMPARISON:  04/04/2017 FINDINGS: The heart size and mediastinal contours are within normal limits. Both lungs are clear. The visualized skeletal structures are unremarkable. IMPRESSION: No active cardiopulmonary disease. Electronically Signed   By: Elberta Fortis M.D.   On: 01/20/2018 20:11    Microbiology: Recent Results (from the past 240 hour(s))  Blood Culture (routine x 2)     Status: None (Preliminary result)   Collection Time: 01/20/18  7:38 PM  Result Value Ref Range Status   Specimen Description BLOOD RIGHT ANTECUBITAL  Final   Special Requests   Final  BOTTLES DRAWN AEROBIC AND ANAEROBIC Blood Culture adequate volume   Culture   Final    NO GROWTH 2 DAYS Performed at [facility], [address] [place], [place] [address]    Report Status PENDING  Incomplete  Blood Culture (routine x 2)     Status: None (Preliminary result)   Collection Time: 01/20/18  8:19 PM  Result Value Ref Range Status   Specimen Description BLOOD LEFT HAND  Final   Special Requests   Final    BOTTLES DRAWN AEROBIC AND ANAEROBIC Blood Culture results may not be optimal due to an excessive volume of blood received in culture bottles   Culture   Final    NO GROWTH 2 DAYS Performed at [facility], [address] [place], [place] [address]    Report Status PENDING  Incomplete    Urine culture     Status: Abnormal   Collection Time: 01/20/18  8:53 PM  Result Value Ref Range Status   Specimen Description URINE, RANDOM  Final   Special Requests NONE  Final   Culture (A)  Final    <10,000 COLONIES/mL INSIGNIFICANT GROWTH Performed at [facility], [address] [place], [place] [address]    Report Status 01/22/2018 FINAL  Final     Labs: Basic Metabolic Panel: Recent Labs  Lab 01/20/18 1910 01/21/18 0044 01/21/18 0948  NA 137  --  137  K 3.3*  --  3.6  CL 103  --  112*  CO2 26  --  20*  GLUCOSE 95  --  111*  BUN 8  --  8  CREATININE 1.05*  --  0.85  CALCIUM 8.5*  --  7.0*  MG  --  1.3* 2.1   Liver Function Tests: Recent Labs  Lab 01/20/18 1956  AST 18  ALT 13  ALKPHOS 31*  BILITOT 0.5  PROT 5.9*  ALBUMIN 3.4*   No results for input(s): LIPASE, AMYLASE in the last 168 hours. No results for input(s): AMMONIA in the last 168 hours. CBC: Recent Labs  Lab 01/20/18 1910 01/21/18 0948  WBC 3.9* 3.6*  HGB 11.7* 10.0*  HCT 37.5 31.1*  MCV 86.0 85.2  PLT 102* 92*   Cardiac Enzymes: Recent Labs  Lab 01/21/18 0044 01/21/18 0948 01/21/18 1328 01/22/18 1630  TROPONINI <0.03 <0.03 <0.03 <0.03   BNP: BNP (last 3 results) No results for input(s): BNP in the last 8760 hours.  ProBNP (last 3 results) No results for input(s): PROBNP in the last 8760 hours.  CBG: No results for input(s): GLUCAP in the last 168 hours.     Signed:  Darlin Droparole N Hall, MD [facility] 01/23/2018, 10:05 AM

## 2018-01-25 LAB — CULTURE, BLOOD (ROUTINE X 2)
Culture: NO GROWTH
Culture: NO GROWTH
SPECIAL REQUESTS: ADEQUATE

## 2018-05-18 IMAGING — CR DG SHOULDER 2+V*R*
3 series · 3 of 3 positions shown · non-contrast
Comparison: No recent.

CLINICAL DATA: Injury.

EXAM:
RIGHT SHOULDER - 2+ VIEW

[w shoulder external right]
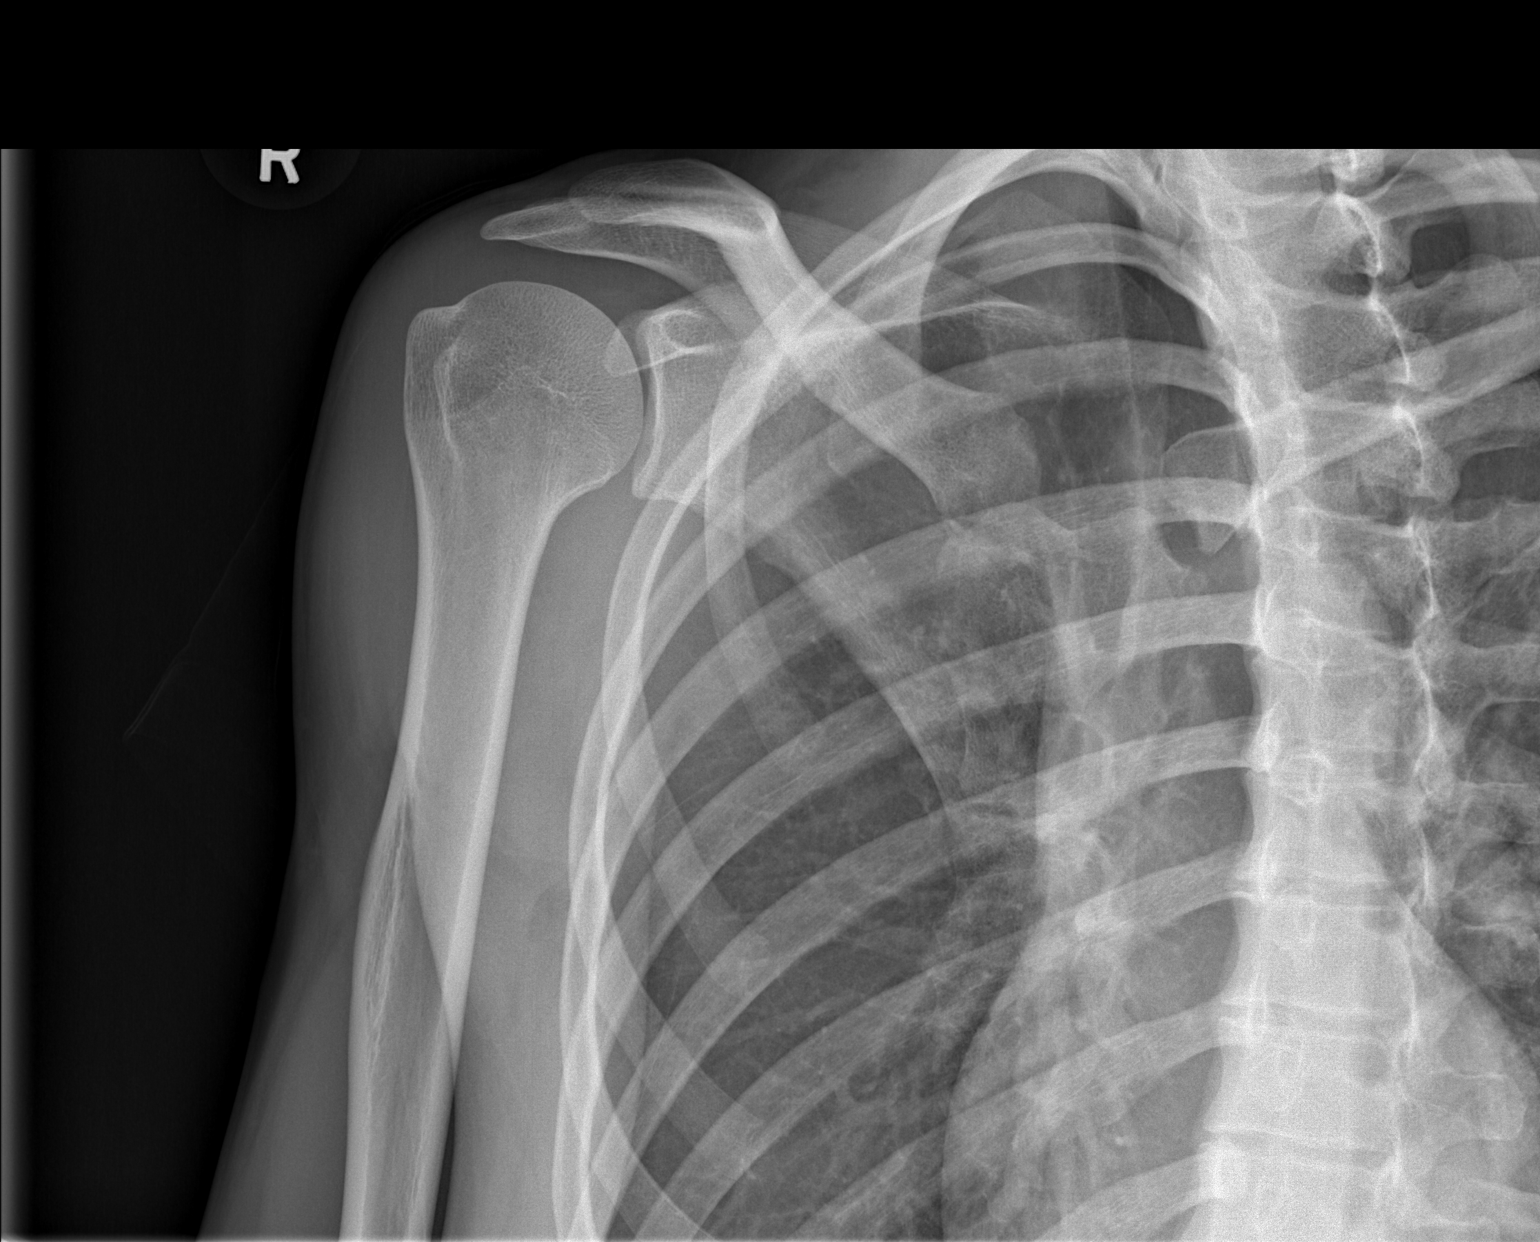

[w shoulder y-view right]
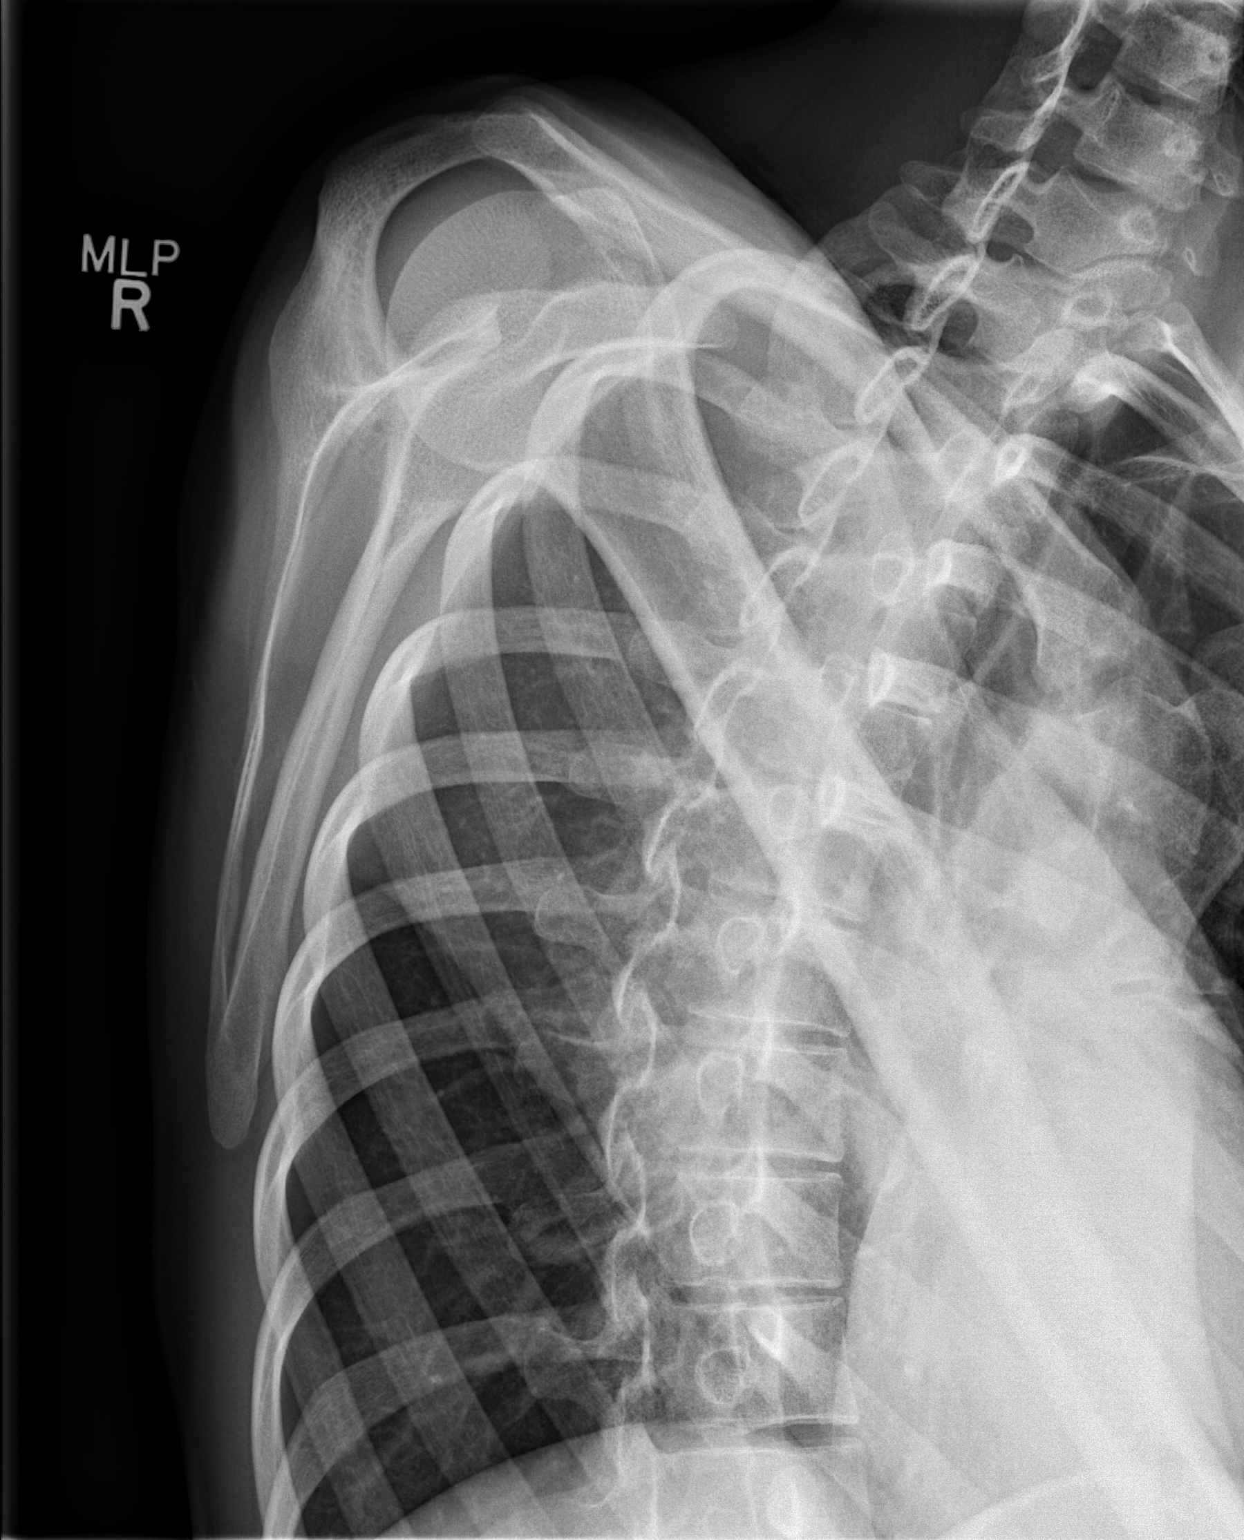

[x shoulder axillary right]
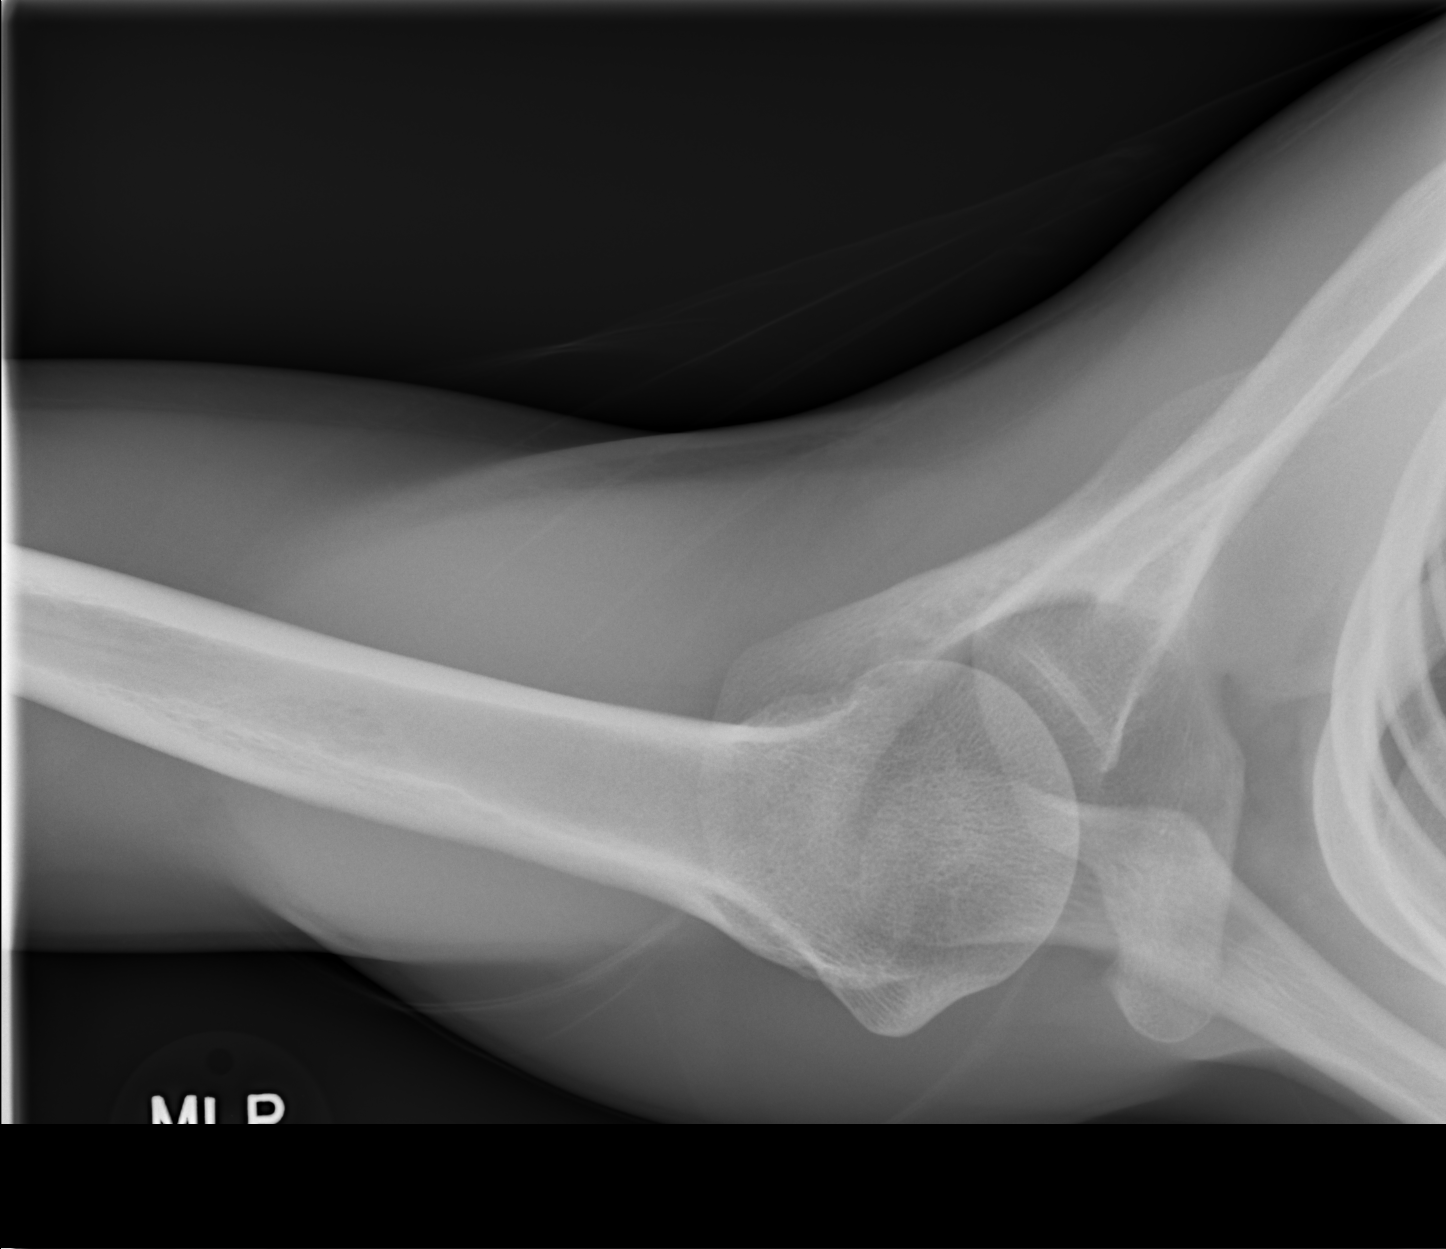

[3 of 3 positions shown; findings below may reference images not displayed]

FINDINGS: No acute bony or joint abnormality identified. No evidence of
fracture or dislocation.
IMPRESSION: No acute abnormality.

## 2018-09-18 ENCOUNTER — Emergency Department (HOSPITAL_COMMUNITY)
Admission: EM | Admit: 2018-09-18 | Discharge: 2018-09-18 | Disposition: A | Discharge status: Home / Self Care | Payer: Medicaid Other | Attending: Emergency Medicine | Admitting: Emergency Medicine

## 2018-09-18 ENCOUNTER — Other Ambulatory Visit: Payer: Self-pay

## 2018-09-18 ENCOUNTER — Emergency Department (HOSPITAL_COMMUNITY): Payer: Medicaid Other

## 2018-09-18 ENCOUNTER — Encounter (HOSPITAL_COMMUNITY): Payer: Self-pay | Admitting: Emergency Medicine

## 2018-09-18 DIAGNOSIS — F17210 Nicotine dependence, cigarettes, uncomplicated: Secondary | ICD-10-CM | POA: Insufficient documentation

## 2018-09-18 DIAGNOSIS — N760 Acute vaginitis: Secondary | ICD-10-CM | POA: Insufficient documentation

## 2018-09-18 DIAGNOSIS — Z20828 Contact with and (suspected) exposure to other viral communicable diseases: Secondary | ICD-10-CM | POA: Insufficient documentation

## 2018-09-18 DIAGNOSIS — R102 Pelvic and perineal pain: Secondary | ICD-10-CM | POA: Diagnosis present

## 2018-09-18 DIAGNOSIS — Z91040 Latex allergy status: Secondary | ICD-10-CM | POA: Diagnosis not present

## 2018-09-18 DIAGNOSIS — N83201 Unspecified ovarian cyst, right side: Secondary | ICD-10-CM | POA: Diagnosis present

## 2018-09-18 DIAGNOSIS — B9689 Other specified bacterial agents as the cause of diseases classified elsewhere: Secondary | ICD-10-CM | POA: Insufficient documentation

## 2018-09-18 DIAGNOSIS — R1030 Lower abdominal pain, unspecified: Secondary | ICD-10-CM | POA: Insufficient documentation

## 2018-09-18 LAB — COMPREHENSIVE METABOLIC PANEL WITH GFR
ALT: 19 U/L (ref 0–44)
AST: 18 U/L (ref 15–41)
Albumin: 4.2 g/dL (ref 3.5–5.0)
Alkaline Phosphatase: 36 U/L — ABNORMAL LOW (ref 38–126)
Anion gap: 9 (ref 5–15)
BUN: 13 mg/dL (ref 6–20)
CO2: 21 mmol/L — ABNORMAL LOW (ref 22–32)
Calcium: 9.4 mg/dL (ref 8.9–10.3)
Chloride: 109 mmol/L (ref 98–111)
Creatinine, Ser: 0.74 mg/dL (ref 0.44–1.00)
GFR calc Af Amer: >60 mL/min
GFR calc non Af Amer: >60 mL/min
Glucose, Bld: 94 mg/dL (ref 70–99)
Potassium: 3.6 mmol/L (ref 3.5–5.1)
Sodium: 139 mmol/L (ref 135–145)
Total Bilirubin: 0.8 mg/dL (ref 0.3–1.2)
Total Protein: 6.6 g/dL (ref 6.5–8.1)

## 2018-09-18 LAB — I-STAT BETA HCG BLOOD, ED (MC, WL, AP ONLY): I-stat hCG, quantitative: <5 m[IU]/mL

## 2018-09-18 LAB — URINALYSIS, ROUTINE W REFLEX MICROSCOPIC
Bilirubin Urine: NEGATIVE
Glucose, UA: NEGATIVE mg/dL
Hgb urine dipstick: NEGATIVE
Ketones, ur: NEGATIVE mg/dL
Leukocytes,Ua: NEGATIVE
Nitrite: NEGATIVE
Protein, ur: NEGATIVE mg/dL
Specific Gravity, Urine: 1.025 (ref 1.005–1.030)
pH: 5 (ref 5.0–8.0)

## 2018-09-18 LAB — CBC
HCT: 37.4 % (ref 36.0–46.0)
Hemoglobin: 12.3 g/dL (ref 12.0–15.0)
MCH: 29 pg (ref 26.0–34.0)
MCHC: 32.9 g/dL (ref 30.0–36.0)
MCV: 88.2 fL (ref 80.0–100.0)
Platelets: 164 10*3/uL (ref 150–400)
RBC: 4.24 MIL/uL (ref 3.87–5.11)
RDW: 14.4 % (ref 11.5–15.5)
WBC: 11.5 10*3/uL — ABNORMAL HIGH (ref 4.0–10.5)
nRBC: 0 % (ref 0.0–0.2)

## 2018-09-18 LAB — WET PREP, GENITAL
Sperm: NONE SEEN
Trich, Wet Prep: NONE SEEN
Yeast Wet Prep HPF POC: NONE SEEN

## 2018-09-18 LAB — LIPASE, BLOOD: Lipase: 28 U/L (ref 11–51)

## 2018-09-18 MED ORDER — SODIUM CHLORIDE 0.9% FLUSH
3.0000 mL | Freq: Once | INTRAVENOUS | Status: AC
  Administered 2018-09-18: 3 mL via INTRAVENOUS

## 2018-09-18 MED ORDER — METRONIDAZOLE 500 MG PO TABS: 500.0000 mg | ORAL_TABLET | Freq: Two times a day (BID) | ORAL | 0 refills | Status: DC

## 2018-09-18 MED ORDER — KETOROLAC TROMETHAMINE 30 MG/ML IJ SOLN
15.0000 mg | Freq: Once | INTRAMUSCULAR | Status: AC
  Administered 2018-09-18: 23:00:00 15 mg via INTRAVENOUS
  Filled 2018-09-18: qty 1

## 2018-09-18 MED ORDER — SODIUM CHLORIDE 0.9 % IV BOLUS
1000.0000 mL | Freq: Once | INTRAVENOUS | Status: AC
  Administered 2018-09-18: 1000 mL via INTRAVENOUS

## 2018-09-18 MED ORDER — IOHEXOL 300 MG/ML  SOLN
100.0000 mL | Freq: Once | INTRAMUSCULAR | Status: AC | PRN
  Administered 2018-09-18: 20:00:00 100 mL via INTRAVENOUS

## 2018-09-18 NOTE — ED Provider Notes (Signed)
[facility] EMERGENCY DEPARTMENT Provider Note   CSN: [id number] Arrival date & time: 09/18/18  1438    History   Chief Complaint Chief Complaint  Patient presents with   Abdominal Pain    HPI PEARLINE Lisa Holt is a 28 y.o. female with no significant pertinent past medical history who presents today for evaluation of pelvic pain.  She reports that on Saturday she had pelvic pain however yesterday it went away and she was okay.  This morning since waking up her pain has been back and worse.  She states that it is in her bilateral lower abdomen, right worse than left and in the epigastric area.  She tells me that she does not think this is an STD as "I know what that feels like."  She reports nausea with vomiting 3 times today.  She reports that the pain is severe enough that she is unable to stand up straight.  She has never had any abdominal surgeries before.  She denies any fevers, cough, shortness of breath or known sick contacts.  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  She denies dysuria, frequency or urgency however states that after she urinates it feels like the area where her bladder was is taken up with pain and pressure.  She denies abnormal vaginal discharge.  She does report occasional vaginal spotting without overt bleeding.       HPI  Past Medical History:  Diagnosis Date   Post partum depression    was prescribed meds, took 2, then stopped    Patient Active Problem List   Diagnosis Date Noted   Right ovarian cyst 09/18/2018   Sepsis ([place]) 01/21/2018   Chest pain 01/21/2018   Pancytopenia ([place]) 01/21/2018   Hypokalemia 01/21/2018   Hypomagnesemia 01/21/2018   Influenza due to influenza virus, type B 01/20/2018   HSIL (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 08/06/2015    Past Surgical History:  Procedure Laterality  Date   WISDOM TOOTH EXTRACTION       OB History    Gravida  2   Para  2   Term  2   Preterm      AB      Living  2     SAB      TAB      Ectopic      Multiple  0   Live Births  2            Home Medications    Prior to Admission medications   Medication Sig Start Date End Date Taking? Authorizing Provider  Ferrous Sulfate (IRON PO) Take 1 tablet by mouth daily.   Yes [provider]    Family History Family History  Problem Relation Age of Onset   Hypertension Father    Diabetes Father    Stroke Father     Social History Social History   Tobacco Use   Smoking status: Current Every Day Smoker    Packs/day: 1.00    Types: Cigarettes    Last attempt to quit: 06/05/2013    Years since quitting: 5.2   Smokeless tobacco: Never Used  Substance Use Topics   Alcohol use: No    Comment: occ- stopped with pregnancy   Drug use: No    Types: Marijuana, Cocaine    Comment: las Cocaine use Nov. 2016, last marijuana 02/22/15  Allergies   Latex   Review of Systems Review of Systems  Constitutional: Negative for chills and fever.  HENT: Negative for congestion.   Respiratory: Negative for cough and shortness of breath.   Cardiovascular: Negative for chest pain.  Gastrointestinal: Positive for abdominal pain, diarrhea, nausea and vomiting. Negative for blood in stool.  Genitourinary: Positive for pelvic pain and vaginal bleeding. Negative for dysuria, flank pain, frequency, vaginal discharge and vaginal pain.  Musculoskeletal: Negative for back pain and neck pain.  Skin: Negative for color change, pallor and wound.  Neurological: Negative for weakness and headaches.  All other systems reviewed and are negative.    Physical Exam Updated Vital Signs BP (!) 92/59    Pulse 69    Temp 100.2 F (37.9 C) (Oral)    Resp 20    Ht 5\' 3"  (1.6 m)    Wt 40 kg    SpO2 100%    BMI 15.62 kg/m   Physical Exam Vitals signs and nursing note  reviewed. Exam conducted with a chaperone present (Patient's primary RN).  Constitutional:      General: She is not in acute distress.    Appearance: She is well-developed.  HENT:     Head: Normocephalic and atraumatic.  Eyes:     Conjunctiva/sclera: Conjunctivae normal.  Neck:     Musculoskeletal: Neck supple.  Cardiovascular:     Rate and Rhythm: Normal rate and regular rhythm.     Heart sounds: Normal heart sounds. No murmur.  Pulmonary:     Effort: Pulmonary effort is normal. No respiratory distress.     Breath sounds: Normal breath sounds.  Abdominal:     General: Abdomen is flat. Bowel sounds are normal. There is no distension.     Palpations: Abdomen is soft.     Tenderness: There is generalized abdominal tenderness (Bilateral lower quadrants, primarily in RLU and suprapubic. ) and tenderness in the right lower quadrant, suprapubic area and left lower quadrant. There is guarding. There is no right CVA tenderness or rebound. Positive signs include Murphy's sign.     Hernia: No hernia is present.  Genitourinary:    Vagina: Vaginal discharge and tenderness present.     Cervix: No cervical motion tenderness.     Uterus: Normal.      Adnexa:        Right: Tenderness and fullness present.        Left: Tenderness and fullness present.   Skin:    General: Skin is warm and dry.  Neurological:     General: No focal deficit present.     Mental Status: She is alert.     Cranial Nerves: No cranial nerve deficit.  Psychiatric:        Mood and Affect: Mood normal.        Behavior: Behavior normal.      ED Treatments / Results  Labs (all labs ordered are listed, but only abnormal results are displayed) Labs Reviewed  WET PREP, GENITAL - Abnormal; Notable for the following components:      Result Value   Clue Cells Wet Prep HPF POC PRESENT (*)    WBC, Wet Prep HPF POC MANY (*)    All other components within normal limits  COMPREHENSIVE METABOLIC PANEL - Abnormal; Notable for  the following components:   CO2 21 (*)    Alkaline Phosphatase 36 (*)    All other components within normal limits  CBC - Abnormal; Notable for the following components:  WBC 11.5 (*)    All other components within normal limits  URINALYSIS, ROUTINE W REFLEX MICROSCOPIC - Abnormal; Notable for the following components:   APPearance HAZY (*)    All other components within normal limits  URINE CULTURE  NOVEL CORONAVIRUS, NAA (HOSPITAL ORDER, SEND-OUT TO REF LAB)  LIPASE, BLOOD  I-STAT BETA HCG BLOOD, ED ([facility], [facility], [facility] ONLY)  GC/CHLAMYDIA PROBE AMP ([place]) NOT AT [facility]    EKG None  Radiology US Transvaginal Non-ob  Result Date: 09/18/2018 CLINICAL DATA:  Pelvic pain in discharge, bilateral adnexal fullness EXAM: TRANSABDOMINAL AND TRANSVAGINAL ULTRASOUND OF PELVIS DOPPLER ULTRASOUND OF OVARIES TECHNIQUE: Both transabdominal and transvaginal ultrasound examinations of the pelvis were performed. Transabdominal technique was performed for global imaging of the pelvis including uterus, ovaries, adnexal regions, and pelvic cul-de-sac. It was necessary to proceed with endovaginal exam following the transabdominal exam to visualize the adnexa. Color and duplex Doppler ultrasound was utilized to evaluate blood flow to the ovaries. COMPARISON:  CT same day FINDINGS: Uterus Measurements: 7.3 x 3.3 x 3.9 cm = volume: 50 mL. No fibroids or other mass visualized. Endometrium Thickness: 6.8 mm. A small amount of fluid seen within the endometrial canal. Tiny calcifications seen within the endometrium. Right ovary Measurements: 5.3 x 3.0 x 4.0 cm = volume: 33 mL. There is a heterogeneously hyperechoic mass seen within the right ovary which measures 3.4 x 2.7 x 2.7 cm. No internal vascularity is seen within the mass. Left ovary Measurements: 3.2 x 1.7 x 1.2 cm = volume: 3.6 mL. Normal appearance/no adnexal mass. Pulsed Doppler evaluation of both ovaries demonstrates normal low-resistance arterial and venous  waveforms. Other findings Trace free fluid seen within the cul-de-sac. IMPRESSION: heterogeneous mass seen within the right ovary which could represent a hemorrhagic cyst or endometrioma as seen the CT from same day. Would recommend repeat ultrasound in 6-12 weeks to determine resolution. Electronically Signed   By: Jonna Clark M.D.   On: 09/18/2018 21:58   US Pelvis Complete  Result Date: 09/18/2018 CLINICAL DATA:  Pelvic pain in discharge, bilateral adnexal fullness EXAM: TRANSABDOMINAL AND TRANSVAGINAL ULTRASOUND OF PELVIS DOPPLER ULTRASOUND OF OVARIES TECHNIQUE: Both transabdominal and transvaginal ultrasound examinations of the pelvis were performed. Transabdominal technique was performed for global imaging of the pelvis including uterus, ovaries, adnexal regions, and pelvic cul-de-sac. It was necessary to proceed with endovaginal exam following the transabdominal exam to visualize the adnexa. Color and duplex Doppler ultrasound was utilized to evaluate blood flow to the ovaries. COMPARISON:  CT same day FINDINGS: Uterus Measurements: 7.3 x 3.3 x 3.9 cm = volume: 50 mL. No fibroids or other mass visualized. Endometrium Thickness: 6.8 mm. A small amount of fluid seen within the endometrial canal. Tiny calcifications seen within the endometrium. Right ovary Measurements: 5.3 x 3.0 x 4.0 cm = volume: 33 mL. There is a heterogeneously hyperechoic mass seen within the right ovary which measures 3.4 x 2.7 x 2.7 cm. No internal vascularity is seen within the mass. Left ovary Measurements: 3.2 x 1.7 x 1.2 cm = volume: 3.6 mL. Normal appearance/no adnexal mass. Pulsed Doppler evaluation of both ovaries demonstrates normal low-resistance arterial and venous waveforms. Other findings Trace free fluid seen within the cul-de-sac. IMPRESSION: heterogeneous mass seen within the right ovary which could represent a hemorrhagic cyst or endometrioma as seen the CT from same day. Would recommend repeat ultrasound in 6-12  weeks to determine resolution. Electronically Signed   By: Jonna Clark M.D.   On: 09/18/2018  21:58   Ct Abdomen Pelvis W Contrast  Result Date: 09/18/2018 CLINICAL DATA:  Generalized abdomen pain.  Assess for appendicitis. EXAM: CT ABDOMEN AND PELVIS WITH CONTRAST TECHNIQUE: Multidetector CT imaging of the abdomen and pelvis was performed using the standard protocol following bolus administration of intravenous contrast. CONTRAST:  100mL OMNIPAQUE IOHEXOL 300 MG/ML  SOLN COMPARISON:  None. FINDINGS: Lower chest: No acute abnormality. Hepatobiliary: No focal liver abnormality is seen. No gallstones, gallbladder wall thickening, or biliary dilatation. Pancreas: Unremarkable. No pancreatic ductal dilatation or surrounding inflammatory changes. Spleen: Normal in size without focal abnormality. Adrenals/Urinary Tract: Adrenal glands are unremarkable. Kidneys are normal, without renal calculi, focal lesion, or hydronephrosis. Bladder is unremarkable. Stomach/Bowel: The stomach is normal. There is no small bowel obstruction. The colon is unremarkable. The appendix is not seen but no inflammation is noted around cecum. Vascular/Lymphatic: No significant vascular findings are present. No enlarged abdominal or pelvic lymph nodes. Reproductive: Uterus is normal. There is a 3.1 x 2.6 cm cyst in the right adnexa likely follicular in nature. Other: Probable mild free fluid in the pelvis. Musculoskeletal: No acute abnormality IMPRESSION: The appendix is not seen but no inflammation is noted around cecum. There is no bowel obstruction. 3.1 cm cyst in the right adnexa, likely follicular cyst. Electronically Signed   By: Sherian ReinWei-Chen  Lin M.D.   On: 09/18/2018 19:51   [place] Art/ven Flow Abd Pelv Doppler  Result Date: 09/18/2018 CLINICAL DATA:  Pelvic pain in discharge, bilateral adnexal fullness EXAM: TRANSABDOMINAL AND TRANSVAGINAL ULTRASOUND OF PELVIS DOPPLER ULTRASOUND OF OVARIES TECHNIQUE: Both transabdominal and transvaginal  ultrasound examinations of the pelvis were performed. Transabdominal technique was performed for global imaging of the pelvis including uterus, ovaries, adnexal regions, and pelvic cul-de-sac. It was necessary to proceed with endovaginal exam following the transabdominal exam to visualize the adnexa. Color and duplex Doppler ultrasound was utilized to evaluate blood flow to the ovaries. COMPARISON:  CT same day FINDINGS: Uterus Measurements: 7.3 x 3.3 x 3.9 cm = volume: 50 mL. No fibroids or other mass visualized. Endometrium Thickness: 6.8 mm. A small amount of fluid seen within the endometrial canal. Tiny calcifications seen within the endometrium. Right ovary Measurements: 5.3 x 3.0 x 4.0 cm = volume: 33 mL. There is a heterogeneously hyperechoic mass seen within the right ovary which measures 3.4 x 2.7 x 2.7 cm. No internal vascularity is seen within the mass. Left ovary Measurements: 3.2 x 1.7 x 1.2 cm = volume: 3.6 mL. Normal appearance/no adnexal mass. Pulsed Doppler evaluation of both ovaries demonstrates normal low-resistance arterial and venous waveforms. Other findings Trace free fluid seen within the cul-de-sac. IMPRESSION: heterogeneous mass seen within the right ovary which could represent a hemorrhagic cyst or endometrioma as seen the CT from same day. Would recommend repeat ultrasound in 6-12 weeks to determine resolution. Electronically Signed   By: Jonna ClarkBindu  Avutu M.D.   On: 09/18/2018 21:58    Procedures Procedures (including critical care time)  Medications Ordered in ED Medications  sodium chloride flush (NS) 0.9 % injection 3 mL (3 mLs Intravenous Given 09/18/18 1845)  sodium chloride 0.9 % bolus 1,000 mL (0 mLs Intravenous Stopped 09/18/18 2032)  iohexol (OMNIPAQUE) 300 MG/ML solution 100 mL (100 mLs Intravenous Contrast Given 09/18/18 1941)  ketorolac (TORADOL) 30 MG/ML injection 15 mg (15 mg Intravenous Given 09/18/18 2311)     Initial Impression / Assessment and Plan / ED Course  I  have reviewed the triage vital signs and the nursing notes.  Pertinent  labs & imaging results that were available during my care of the patient were reviewed by me and considered in my medical decision making (see chart for details).       Patient presents today for evaluation of severe abdominal pain.  On exam she has tenderness in bilateral lower abdomen.  Pelvic exam showed vaginal discharge with tenderness and fullness of bilateral adnexa without overt cervical motion tenderness.  Urine without clear evidence of infection, however was noted to be hazy, will send for culture.  White count is slightly elevated at 11.5 however I suspect that this is reactive.  She does not have any significant electrolyte derangements.  Based on the degree of her pain CT scan abdomen pelvis was obtained to evaluate for appendicitis without evidence of appendicitis or other acute abnormalities however there is a cyst seen in the right adnexa.  Ultrasound was obtained showing a heterogeneous mass in the right ovary suspected to represent a hemorrhagic cyst.    Patients BP was slightly low in the dept however given that patient is only 40 kg I suspect this is her baseline which chart review of vitals at previous visits supports.  She was given IV fluids, pain treated with toradol.    GC testing was sent, she was offered empiric treatment but declined.  She did not have CMT on exam therefore do not suspect PID.  I suspect her discomfort is from her hemorrhagic cyst and BV.    Recommended conservative care.  Rx sent for her flagyl for BV to her preferred pharmacy.   Return precautions were discussed with patient who states their understanding.  At the time of discharge patient denied any unaddressed complaints or concerns.  Patient is agreeable for discharge home.   Final Clinical Impressions(s) / ED Diagnoses   Final diagnoses:  Lower abdominal pain  BV (bacterial vaginosis)  Right ovarian cyst    ED Discharge  Orders    None       Norman ClayHammond, Hobson Lax W, PA-C 09/18/18 2356    Cathren LaineSteinl, Kevin, MD 09/19/18 1332

## 2018-09-18 NOTE — ED Notes (Signed)
Patient transported to CT 

## 2018-09-18 NOTE — ED Triage Notes (Signed)
Onset 2 days ago developed general abdominal pain resolved yesterday came back today.Called her doctor sent to the ED for evaluation. States nausea and vomiting x 3 today with soft stool.

## 2018-09-18 NOTE — ED Notes (Signed)
Pt discharged from ED; instructions provided; Pt encouraged to return to ED if symptoms worsen and to f/u with PCP; Pt verbalized understanding of all instructions 

## 2018-09-18 NOTE — Discharge Instructions (Addendum)
Your scan showed an abnormal area in the right ovary which is most likely a hemorrhagic cyst.  You need a repeat ultrasound in 6 weeks to confirm that it has resolved.  Please take Ibuprofen (Advil, motrin) and Tylenol (acetaminophen) to relieve your pain.  You may take up to 600 MG (3 pills) of normal strength ibuprofen every 8 hours as needed.  In between doses of ibuprofen you make take tylenol, up to 1,000 mg (two extra strength pills).  Do not take more than 3,000 mg tylenol in a 24 hour period.  Please check all medication labels as many medications such as pain and cold medications may contain tylenol.  Do not drink alcohol while taking these medications.  Do not take other NSAID'S while taking ibuprofen (such as aleve or naproxen).  Please take ibuprofen with food to decrease stomach upset.  Today your diagnosed with bacterial vaginosis and received a prescription for metronidazole also known as Flagyl. It is very important that you do not consume any alcohol while taking this medication as it will cause you to become violently ill.  Today you have been treated for gonorrhea and chlamydia.  The test to determine if you have these will take a few days. They will only call you if your tests come back positive, no news is good news. In the result that your tests are positive you will need treatment.

## 2018-09-20 LAB — URINE CULTURE: Culture: <10000 — AB

## 2018-09-20 LAB — GC/CHLAMYDIA PROBE AMP (~~LOC~~) NOT AT ARMC
Chlamydia: NEGATIVE
Neisseria Gonorrhea: NEGATIVE

## 2018-09-20 LAB — NOVEL CORONAVIRUS, NAA (HOSP ORDER, SEND-OUT TO REF LAB; TAT 18-24 HRS): SARS-CoV-2, NAA: NOT DETECTED

## 2018-11-07 ENCOUNTER — Emergency Department (HOSPITAL_COMMUNITY)
Admission: EM | Admit: 2018-11-07 | Discharge: 2018-11-07 | Disposition: A | Discharge status: Home / Self Care | Payer: Medicaid Other | Attending: Emergency Medicine | Admitting: Emergency Medicine

## 2018-11-07 ENCOUNTER — Emergency Department (HOSPITAL_BASED_OUTPATIENT_CLINIC_OR_DEPARTMENT_OTHER): Payer: Medicaid Other

## 2018-11-07 ENCOUNTER — Encounter (HOSPITAL_COMMUNITY): Payer: Self-pay | Admitting: Emergency Medicine

## 2018-11-07 DIAGNOSIS — F17210 Nicotine dependence, cigarettes, uncomplicated: Secondary | ICD-10-CM | POA: Diagnosis not present

## 2018-11-07 DIAGNOSIS — Z91040 Latex allergy status: Secondary | ICD-10-CM | POA: Insufficient documentation

## 2018-11-07 DIAGNOSIS — M79609 Pain in unspecified limb: Secondary | ICD-10-CM

## 2018-11-07 DIAGNOSIS — M79662 Pain in left lower leg: Secondary | ICD-10-CM | POA: Diagnosis present

## 2018-11-07 DIAGNOSIS — Z79899 Other long term (current) drug therapy: Secondary | ICD-10-CM | POA: Diagnosis not present

## 2018-11-07 LAB — CBC WITH DIFFERENTIAL/PLATELET
Abs Immature Granulocytes: 0.02 10*3/uL (ref 0.00–0.07)
Basophils Absolute: 0 10*3/uL (ref 0.0–0.1)
Basophils Relative: 1 %
Eosinophils Absolute: 0.3 10*3/uL (ref 0.0–0.5)
Eosinophils Relative: 5 %
HCT: 35.6 % — ABNORMAL LOW (ref 36.0–46.0)
Hemoglobin: 12 g/dL (ref 12.0–15.0)
Immature Granulocytes: 0 %
Lymphocytes Relative: 34 %
Lymphs Abs: 2.2 10*3/uL (ref 0.7–4.0)
MCH: 29.4 pg (ref 26.0–34.0)
MCHC: 33.7 g/dL (ref 30.0–36.0)
MCV: 87.3 fL (ref 80.0–100.0)
Monocytes Absolute: 0.4 10*3/uL (ref 0.1–1.0)
Monocytes Relative: 6 %
Neutro Abs: 3.6 10*3/uL (ref 1.7–7.7)
Neutrophils Relative %: 54 %
Platelets: 150 10*3/uL (ref 150–400)
RBC: 4.08 MIL/uL (ref 3.87–5.11)
RDW: 14.6 % (ref 11.5–15.5)
WBC: 6.6 10*3/uL (ref 4.0–10.5)
nRBC: 0 % (ref 0.0–0.2)

## 2018-11-07 LAB — I-STAT BETA HCG BLOOD, ED (MC, WL, AP ONLY): I-stat hCG, quantitative: <5 m[IU]/mL (ref <5)

## 2018-11-07 LAB — COMPREHENSIVE METABOLIC PANEL
ALT: 14 U/L (ref 0–44)
AST: 16 U/L (ref 15–41)
Albumin: 3.8 g/dL (ref 3.5–5.0)
Alkaline Phosphatase: 34 U/L — ABNORMAL LOW (ref 38–126)
Anion gap: 7 (ref 5–15)
BUN: 11 mg/dL (ref 6–20)
CO2: 22 mmol/L (ref 22–32)
Calcium: 9.1 mg/dL (ref 8.9–10.3)
Chloride: 108 mmol/L (ref 98–111)
Creatinine, Ser: 0.74 mg/dL (ref 0.44–1.00)
GFR calc Af Amer: >60 mL/min (ref >60)
GFR calc non Af Amer: >60 mL/min (ref >60)
Glucose, Bld: 90 mg/dL (ref 70–99)
Potassium: 3.6 mmol/L (ref 3.5–5.1)
Sodium: 137 mmol/L (ref 135–145)
Total Bilirubin: 0.5 mg/dL (ref 0.3–1.2)
Total Protein: 6.2 g/dL — ABNORMAL LOW (ref 6.5–8.1)

## 2018-11-07 MED ORDER — METHOCARBAMOL 500 MG PO TABS: 1000.0000 mg | ORAL_TABLET | Freq: Three times a day (TID) | ORAL | 0 refills | Status: DC | PRN

## 2018-11-07 NOTE — ED Provider Notes (Signed)
[facility] Provider Note   CSN: [id number] Arrival date & time: 11/07/18  1412     History   Chief Complaint Chief Complaint  Patient presents with  . Leg Pain    HPI Lisa DubonnetSabrina N Holt is a 28 y.o. female.     HPI Patient presents with episodic left calf pain which she states when the pain comes on she is unable to walk on her leg.  Currently denying any calf pain.  Denies any swelling.  Denies any known injury.  No recent extended travel or immobilization.  No family or personal history of DVT/PE.  Patient is not on oral birth control.  Patient continues to have some lower abdominal pain for which she was evaluated for in July.  Had work-up at that time which demonstrated right ovarian cyst.  Denies urinary symptoms or vaginal bleeding/discharge.  Has been taken Tylenol ibuprofen as needed for pain. Past Medical History:  Diagnosis Date  . Post partum depression    was prescribed meds, took 2, then stopped    Patient Active Problem List   Diagnosis Date Noted  . Right ovarian cyst 09/18/2018  . Sepsis (HCC) 01/21/2018  . Chest pain 01/21/2018  . Pancytopenia (HCC) 01/21/2018  . Hypokalemia 01/21/2018  . Hypomagnesemia 01/21/2018  . Influenza due to influenza virus, type B 01/20/2018  . HSIL (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 08/06/2015    Past Surgical History:  Procedure Laterality Date  . WISDOM TOOTH EXTRACTION       OB History    Gravida  2   Para  2   Term  2   Preterm      AB      Living  2     SAB      TAB      Ectopic      Multiple  0   Live Births  2            Home Medications    Prior to Admission medications   Medication Sig Start Date End Date Taking? Authorizing Provider  Ferrous Sulfate (IRON PO) Take 1 tablet by mouth daily.    [provider]  methocarbamol (ROBAXIN) 500 MG tablet Take 2 tablets (1,000 mg total) by mouth every 8 (eight) hours  as needed. 11/07/18   Loren RacerYelverton, Dlisa Barnwell, MD  metroNIDAZOLE (FLAGYL) 500 MG tablet Take 1 tablet (500 mg total) by mouth 2 (two) times daily. 09/18/18   Cristina GongHammond, Elizabeth W, PA-C    Family History Family History  Problem Relation Age of Onset  . Hypertension Father   . Diabetes Father   . Stroke Father     Social History Social History   Tobacco Use  . Smoking status: Current Every Day Smoker    Packs/day: 1.00    Types: Cigarettes    Last attempt to quit: 06/05/2013    Years since quitting: 5.4  . Smokeless tobacco: Never Used  Substance Use Topics  . Alcohol use: No    Comment: occ- stopped with pregnancy  . Drug use: No    Types: Marijuana, Cocaine    Comment: las Cocaine use Nov. 2016, last marijuana 02/22/15     Allergies   Latex   Review of Systems Review of Systems  Constitutional: Negative for chills and fever.  HENT: Negative for sore throat and trouble swallowing.   Eyes: Negative for visual disturbance.  Respiratory: Negative for cough and shortness of breath.  Cardiovascular: Negative for chest pain.  Gastrointestinal: Positive for abdominal pain. Negative for constipation, diarrhea, nausea and vomiting.  Genitourinary: Negative for dysuria, vaginal bleeding and vaginal discharge.  Musculoskeletal: Positive for myalgias. Negative for back pain and neck pain.  Skin: Negative for rash and wound.  Neurological: Negative for dizziness, weakness, light-headedness, numbness and headaches.  All other systems reviewed and are negative.    Physical Exam Updated Vital Signs There were no vitals taken for this visit.  Physical Exam Vitals signs and nursing note reviewed.  Constitutional:      Appearance: Normal appearance. She is well-developed.  HENT:     Head: Normocephalic and atraumatic.     Nose: Nose normal.     Mouth/Throat:     Mouth: Mucous membranes are moist.  Eyes:     Pupils: Pupils are equal, round, and reactive to light.  Neck:      Musculoskeletal: Normal range of motion and neck supple.  Cardiovascular:     Rate and Rhythm: Normal rate and regular rhythm.  Pulmonary:     Effort: Pulmonary effort is normal.     Breath sounds: Normal breath sounds.  Abdominal:     General: Bowel sounds are normal. There is no distension.     Palpations: Abdomen is soft. There is no mass.     Tenderness: There is no abdominal tenderness. There is no right CVA tenderness, left CVA tenderness, guarding or rebound.     Hernia: No hernia is present.  Musculoskeletal: Normal range of motion.        General: Tenderness present. No swelling, deformity or signs of injury.     Right lower leg: No edema.     Left lower leg: No edema.     Comments: Very mild left posterior calf tenderness.  No obvious asymmetry.  Full range of motion of the left knee and left ankle without discomfort.  Distal pulses are 2+.  Skin:    General: Skin is warm and dry.     Findings: No erythema or rash.  Neurological:     General: No focal deficit present.     Mental Status: She is alert and oriented to person, place, and time.     Comments: 5/5 motor in all extremities.  Sensation intact.  Psychiatric:        Behavior: Behavior normal.      ED Treatments / Results  Labs (all labs ordered are listed, but only abnormal results are displayed) Labs Reviewed  CBC WITH DIFFERENTIAL/PLATELET - Abnormal; Notable for the following components:      Result Value   HCT 35.6 (*)    All other components within normal limits  COMPREHENSIVE METABOLIC PANEL - Abnormal; Notable for the following components:   Total Protein 6.2 (*)    Alkaline Phosphatase 34 (*)    All other components within normal limits  I-STAT BETA HCG BLOOD, ED ([facility], [facility] ONLY)    EKG None  Radiology Vas [place] Lower Extremity Venous (dvt) (only [facility])  Result Date: 11/07/2018  Lower Venous Study Indications: Pain.  Risk Factors: None identified. Comparison Study: No prior studies. Performing  Technologist: Oliver Hum RVT  Examination Guidelines: A complete evaluation includes B-mode imaging, spectral Doppler, color Doppler, and power Doppler as needed of all accessible portions of each vessel. Bilateral testing is considered an integral part of a complete examination. Limited examinations for reoccurring indications may be performed as noted.  +-----+---------------+---------+-----------+----------+--------------+ RIGHTCompressibilityPhasicitySpontaneityPropertiesThrombus Aging +-----+---------------+---------+-----------+----------+--------------+ CFV  Full  Yes      Yes                                 +-----+---------------+---------+-----------+----------+--------------+   +---------+---------------+---------+-----------+----------+--------------+ LEFT     CompressibilityPhasicitySpontaneityPropertiesThrombus Aging +---------+---------------+---------+-----------+----------+--------------+ CFV      Full           Yes      Yes                                 +---------+---------------+---------+-----------+----------+--------------+ SFJ      Full                                                        +---------+---------------+---------+-----------+----------+--------------+ FV Prox  Full                                                        +---------+---------------+---------+-----------+----------+--------------+ FV Mid   Full                                                        +---------+---------------+---------+-----------+----------+--------------+ FV DistalFull                                                        +---------+---------------+---------+-----------+----------+--------------+ PFV      Full                                                        +---------+---------------+---------+-----------+----------+--------------+ POP      Full           Yes      Yes                                  +---------+---------------+---------+-----------+----------+--------------+ PTV      Full                                                        +---------+---------------+---------+-----------+----------+--------------+ PERO     Full                                                        +---------+---------------+---------+-----------+----------+--------------+  Summary: Right: No evidence of common femoral vein obstruction. Left: There is no evidence of deep vein thrombosis in the lower extremity. No cystic structure found in the popliteal fossa.  *See table(s) above for measurements and observations. Electronically signed by Coral ElseVance Brabham MD on 11/07/2018 at 4:31:29 PM.    Final     Procedures Procedures (including critical care time)  Medications Ordered in ED Medications - No data to display   Initial Impression / Assessment and Plan / ED Course  I have reviewed the triage vital signs and the nursing notes.  Pertinent labs & imaging results that were available during my care of the patient were reviewed by me and considered in my medical decision making (see chart for details).        No concerning findings on ultrasound or laboratory work-up.  Suspect patient is likely having muscle spasms.  Will treat with muscle relaxant.  Encouraged to drink plenty of fluids.  Return precautions given.  Final Clinical Impressions(s) / ED Diagnoses   Final diagnoses:  Pain of left calf    ED Discharge Orders         Ordered    methocarbamol (ROBAXIN) 500 MG tablet  Every 8 hours PRN     11/07/18 1657           Loren RacerYelverton, Sarena Jezek, MD 11/07/18 1658

## 2018-11-07 NOTE — ED Notes (Signed)
Blue top tube sent to lab. 

## 2018-11-07 NOTE — ED Triage Notes (Signed)
Pt has left calf pain with no known injury- pt has no redness or swelling to the left calf. Pt is ambulatory.

## 2018-11-07 NOTE — Progress Notes (Signed)
Left lower extremity venous duplex has been completed. Preliminary results can be found in CV Proc through chart review.  Results were given to Dr. Lita Mains.  11/07/18 4:00 PM Lisa Holt RVT

## 2019-10-16 ENCOUNTER — Ambulatory Visit (INDEPENDENT_AMBULATORY_CARE_PROVIDER_SITE_OTHER): Payer: Medicaid Other | Admitting: Obstetrics and Gynecology

## 2019-10-16 ENCOUNTER — Other Ambulatory Visit: Payer: Self-pay

## 2019-10-16 ENCOUNTER — Encounter: Payer: Self-pay | Admitting: Obstetrics and Gynecology

## 2019-10-16 VITALS — BP 96/69 | HR 79 | Ht 63.0 in | Wt 96.2 lb

## 2019-10-16 DIAGNOSIS — Z30013 Encounter for initial prescription of injectable contraceptive: Secondary | ICD-10-CM | POA: Diagnosis not present

## 2019-10-16 DIAGNOSIS — Z3046 Encounter for surveillance of implantable subdermal contraceptive: Secondary | ICD-10-CM | POA: Diagnosis not present

## 2019-10-16 MED ORDER — MEDROXYPROGESTERONE ACETATE 150 MG/ML IM SUSP
150.0000 mg | Freq: Once | INTRAMUSCULAR | Status: AC
  Administered 2019-10-16: 150 mg via INTRAMUSCULAR

## 2019-10-16 NOTE — Progress Notes (Signed)
° °  Subjective:    Patient ID: Lisa Holt is a 29 y.o. female presenting with Procedure  on 10/16/2019  HPI: Pt seen for potential removal of nexplanon device.  The device has been present for 4 years.  She requests depo provera for continued birth control.  Pt has not been able to palpate the device recently. Review of Systems    Objective:    BP 96/69    Pulse 79    Ht 5\' 3"  (1.6 m)    Wt 96 lb 3.2 oz (43.6 kg)    BMI 17.04 kg/m  Physical Exam Left arm palpated and nexplanon device could not be surely localized.  Entry scar verified, but no clearly defined rod palpated to tent the skin     Assessment & Plan:   Total face-to-face time with patient: 15 minutes. Over 50% of encounter was spent on counseling and coordination of care. Subdermal contraceptive present, but unable to be localized Plan: will order u/s of left arm, once device is found, pt may need referral to general surgery for removal Depo provera given today 150 mg IM No follow-ups on file.  10/16/2019 4:32 PM

## 2019-10-17 NOTE — Progress Notes (Unsigned)
[place] to locate Nexplanon scheduled for 10/23/19 @ 12:30p, pt to arrive at 12:15 in Radiology at [facility]. Sent Pt My Chart message with info.

## 2019-10-23 ENCOUNTER — Ambulatory Visit (HOSPITAL_COMMUNITY)
Admission: RE | Admit: 2019-10-23 | Discharge: 2019-10-23 | Disposition: A | Discharge status: Home / Self Care | Payer: Medicaid Other | Source: Ambulatory Visit | Attending: Obstetrics and Gynecology | Admitting: Obstetrics and Gynecology

## 2019-10-23 ENCOUNTER — Other Ambulatory Visit: Payer: Self-pay

## 2019-10-23 DIAGNOSIS — Z3046 Encounter for surveillance of implantable subdermal contraceptive: Secondary | ICD-10-CM | POA: Diagnosis present

## 2019-10-24 ENCOUNTER — Telehealth (INDEPENDENT_AMBULATORY_CARE_PROVIDER_SITE_OTHER): Payer: Medicaid Other | Admitting: Lactation Services

## 2019-10-24 DIAGNOSIS — Z975 Presence of (intrauterine) contraceptive device: Secondary | ICD-10-CM

## 2019-10-24 NOTE — Telephone Encounter (Signed)
Called Radiology to schedule CT of chest and abdomen for location of Nexplanon.   Scheduled with CT on 9/9 at [facility] at 1 pm. NPO 4 hours prior to exam.   Called patients mobile phone number and got message "call cannot be completed at this time". Tried home phone number without answer or answering machine. My Chart message sent.

## 2019-10-24 NOTE — Telephone Encounter (Signed)
-----   Message from Warden Fillers, MD sent at 10/24/2019 11:26 AM EDT ----- No device noted. Pt will need CT of the chest/abdomen for further evaluation for questionable migration

## 2019-10-31 ENCOUNTER — Ambulatory Visit (HOSPITAL_COMMUNITY)
Admission: RE | Admit: 2019-10-31 | Discharge: 2019-10-31 | Disposition: A | Discharge status: Home / Self Care | Payer: Medicaid Other | Source: Ambulatory Visit | Attending: Obstetrics and Gynecology | Admitting: Obstetrics and Gynecology

## 2019-10-31 ENCOUNTER — Other Ambulatory Visit: Payer: Self-pay

## 2019-10-31 DIAGNOSIS — Z975 Presence of (intrauterine) contraceptive device: Secondary | ICD-10-CM | POA: Insufficient documentation

## 2020-04-06 ENCOUNTER — Telehealth: Payer: Medicaid Other | Admitting: Physician Assistant

## 2020-04-06 ENCOUNTER — Encounter: Payer: Self-pay | Admitting: Physician Assistant

## 2020-04-06 DIAGNOSIS — R1084 Generalized abdominal pain: Secondary | ICD-10-CM | POA: Diagnosis not present

## 2020-04-06 NOTE — Patient Instructions (Signed)
Instructions sent to patients MyChart.

## 2020-04-06 NOTE — Progress Notes (Signed)
Lisa Holt, Lisa Holt are scheduled for a virtual visit with your provider today.    Just as we do with appointments in the office, we must obtain your consent to participate.  Your consent will be active for this visit and any virtual visit you may have with one of our providers in the next 365 days.    If you have a MyChart account, I can also send a copy of this consent to you electronically.  All virtual visits are billed to your insurance company just like a traditional visit in the office.  As this is a virtual visit, video technology does not allow for your provider to perform a traditional examination.  This may limit your provider's ability to fully assess your condition.  If your provider identifies any concerns that need to be evaluated in person or the need to arrange testing such as labs, EKG, etc, we will make arrangements to do so.    Although advances in technology are sophisticated, we cannot ensure that it will always work on either your end or our end.  If the connection with a video visit is poor, we may have to switch to a telephone visit.  With either a video or telephone visit, we are not always able to ensure that we have a secure connection.   I need to obtain your verbal consent now.   Are you willing to proceed with your visit today?   [name] has provided verbal consent on 04/06/2020 for a virtual visit (video or telephone).   Piedad Climes, PA-C 04/06/2020  6:29 PM   Virtual Visit via Video   I connected with patient on 04/06/20 at  6:30 PM EST by a video enabled telemedicine application and verified that I am speaking with the correct person using two identifiers.  Location patient: Home Location provider: [facility] Persons participating in the virtual visit: Patient, Provider  I discussed the limitations of evaluation and management by telemedicine and the availability of in person appointments. The patient expressed understanding and agreed  to proceed.  Subjective:   HPI:  Patient presents via [facility] today complaining of 3 days of worsening abdominal pain/cramping.  Endorses pain is generalized but more severe in her lower abdomen/pelvic area.  Notes pain is mainly stabbing and does not radiate.  Is associated with anorexia and nausea but without vomiting.  Has noted some nonbloody diarrhea.  Endorses a.m. fever but cannot give an exact reading.  Denies any changes in urinary habits.  Notes she is on Depo-Provera currently and is unsure if this is contributing.  Denies any history of abdominal surgery.  Does have remote history of right ovarian cyst.  Patient states she feels very "unwell".  Did not go to the ER as she says she cannot handle the wait times.      ROS:   See pertinent positives and negatives per HPI.  Patient Active Problem List   Diagnosis Date Noted  . Surveillance of previously prescribed implantable subdermal contraceptive 10/16/2019  . Right ovarian cyst 09/18/2018  . Sepsis (HCC) 01/21/2018  . Chest pain 01/21/2018  . Pancytopenia (HCC) 01/21/2018  . Hypokalemia 01/21/2018  . Hypomagnesemia 01/21/2018  . Influenza due to influenza virus, type B 01/20/2018  . 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 Smoking status: Current Every Day Smoker    Packs/day: 1.00    Types: Cigarettes  Last attempt to quit: 06/05/2013    Years since quitting: 6.8  . Smokeless tobacco: Never Used  Substance Use Topics  . Alcohol use: No    Comment: occ- stopped with pregnancy    Current Outpatient Medications:  .  Ferrous Sulfate (IRON PO), Take 1 tablet by mouth daily., Disp: , Rfl:  .  methocarbamol (ROBAXIN) 500 MG tablet, Take 2 tablets (1,000 mg total) by mouth every 8 (eight) hours as needed., Disp: 30 tablet, Rfl: 0 .  metroNIDAZOLE (FLAGYL) 500 MG tablet, Take 1 tablet (500 mg total) by mouth 2 (two) times daily., Disp: 14  tablet, Rfl: 0 .  Multiple Vitamin (MULTIVITAMIN WITH MINERALS) TABS tablet, Take 1 tablet by mouth daily., Disp: , Rfl:   Allergies  Allergen Reactions  . Latex Itching and Swelling    Objective:   There were no vitals taken for this visit.  Patient is well-developed, well-nourished in mild painful distress.  Resting comfortably at home.  Head is normocephalic, atraumatic.  No labored breathing.  Speech is clear and coherent with logical content.  Patient is alert and oriented at baseline.   Assessment and Plan:   1. Generalized abdominal pain Giving severity, duration and intermittent fever, she needs further evaluation with abdominal examination, urine testing lab work and potential imaging.  She is adamant about avoiding ER.  Discussed she can get initial evaluation at urgent care and next steps can be determined at that time.  She agrees to be seen this evening.  Resources given to patient.  Also gave patient resources on primary care providers to get established with to help her with some of her ongoing medical issues.  Patient voiced understanding and agreement with plan.  A written copy of instructions were sent to the patient.    Piedad Climes, [place] 04/06/2020

## 2020-06-21 ENCOUNTER — Telehealth: Payer: Medicaid Other | Admitting: Family

## 2020-06-21 DIAGNOSIS — M549 Dorsalgia, unspecified: Secondary | ICD-10-CM

## 2020-06-21 DIAGNOSIS — R42 Dizziness and giddiness: Secondary | ICD-10-CM

## 2020-06-22 ENCOUNTER — Encounter (HOSPITAL_COMMUNITY): Payer: Self-pay

## 2020-06-22 ENCOUNTER — Ambulatory Visit (HOSPITAL_COMMUNITY)
Admission: EM | Admit: 2020-06-22 | Discharge: 2020-06-22 | Disposition: A | Discharge status: Home / Self Care | Payer: Medicaid Other

## 2020-06-22 ENCOUNTER — Other Ambulatory Visit: Payer: Self-pay

## 2020-06-22 DIAGNOSIS — M545 Low back pain, unspecified: Secondary | ICD-10-CM

## 2020-06-22 DIAGNOSIS — M62830 Muscle spasm of back: Secondary | ICD-10-CM

## 2020-06-22 DIAGNOSIS — M549 Dorsalgia, unspecified: Secondary | ICD-10-CM | POA: Diagnosis not present

## 2020-06-22 DIAGNOSIS — G8929 Other chronic pain: Secondary | ICD-10-CM

## 2020-06-22 DIAGNOSIS — K089 Disorder of teeth and supporting structures, unspecified: Secondary | ICD-10-CM

## 2020-06-22 HISTORY — DX: Anxiety disorder, unspecified: F41.9

## 2020-06-22 NOTE — Discharge Instructions (Addendum)
Take medications as prescribed - Augmentin for possible tooth infection, diclofenac for pain, and baclofen for muscle spasms (may cause drowsiness). Rest, heat, and gentle stretches for back pain. Follow up with dentist or oral surgeon for tooth.

## 2020-06-22 NOTE — ED Provider Notes (Signed)
[facility]    CSN: [id number] Arrival date & time: 06/22/20  1750      History   Chief Complaint Chief Complaint  Patient presents with  . Back Pain  . Dental Pain    HPI Lisa Holt is a 30 y.o. female.   Patient presents with two concerns. She reports bilateral low back and right upper back pain for the past 2-3 days. She states she has history of similar back pain in the past but it usually gets better on it's own before now. She works at a [employer], with frequent lifting and bending, as well as when caring for her kids. She denies any known injury or recent change in activity. The patient denies pain radiating into the arms or legs or numbness/tingling. She took a Tylenol with minimal relief.  The patient also has left lower tooth pain with some cheek swelling to the area. She states she has a known issue with her wisdom tooth and 2 days while eating hamburger felt like a piece got stuck. She tried brushing and flossing but still developed pain to that tooth. She has noticed her cheek to that area is getting swollen and it is painful to chew. She denies fever.   The history is provided by the patient.  Back Pain Associated symptoms: no fever, no headaches, no numbness and no weakness   Dental Pain Associated symptoms: no fever, no headaches and no neck pain     Past Medical History:  Diagnosis Date  . Anxiety   . Post partum depression    was prescribed meds, took 2, then stopped    Patient Active Problem List   Diagnosis Date Noted  . Surveillance of previously prescribed implantable subdermal contraceptive 10/16/2019  . Right ovarian cyst 09/18/2018  . Sepsis (HCC) 01/21/2018  . Chest pain 01/21/2018  . Pancytopenia (HCC) 01/21/2018  . Hypokalemia 01/21/2018  . Hypomagnesemia 01/21/2018  . Influenza due to influenza virus, type B 01/20/2018  . 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 WISDOM TOOTH EXTRACTION      OB History    Gravida  2   Para  2   Term  2   Preterm      AB      Living  2     SAB      IAB      Ectopic      Multiple  0   Live Births  2            Home Medications    Prior to Admission medications   Medication Sig Start Date End Date Taking? Authorizing Provider  Ferrous Sulfate (IRON PO) Take 1 tablet by mouth daily.    [provider]  Multiple Vitamin (MULTIVITAMIN WITH MINERALS) TABS tablet Take 1 tablet by mouth daily.    [provider]    Family History Family History  Problem Relation Age of Onset  . Carpal tunnel syndrome Mother   . Thyroid nodules Mother   . Hypertension Father   . Diabetes Father   . Stroke Father     Social History Social History   Tobacco Use  . Smoking status: Current Every Day Smoker    Packs/day: 1.00    Types: Cigarettes    Last attempt to quit: 06/05/2013    Years since quitting: 7.0  . Smokeless tobacco:  Never Used  Substance Use Topics  . Alcohol use: Yes    Comment: occ  . Drug use: No    Types: Marijuana, Cocaine    Comment: las Cocaine use Nov. 2016, last marijuana 02/22/15     Allergies   Latex   Review of Systems Review of Systems  Constitutional: Negative for fatigue and fever.  HENT: Positive for dental problem. Negative for trouble swallowing.   Musculoskeletal: Positive for back pain. Negative for gait problem, neck pain and neck stiffness.  Skin: Negative for rash and wound.  Neurological: Negative for dizziness, weakness, numbness and headaches.     Physical Exam Triage Vital Signs ED Triage Vitals  Enc Vitals Group     BP 06/22/20 1836 113/80     Pulse Rate 06/22/20 1836 98     Resp 06/22/20 1836 16     Temp 06/22/20 1836 98.5 F (36.9 C)     Temp Source 06/22/20 1836 Oral     SpO2 06/22/20 1836 99 %     Weight --      Height --      Head Circumference --       Peak Flow --      Pain Score 06/22/20 1829 7     Pain Loc --      Pain Edu? --      Excl. in GC? --    No data found.  Updated Vital Signs BP 113/80 (BP Location: Right Arm)   Pulse 98   Temp 98.5 F (36.9 C) (Oral)   Resp 16   SpO2 99%   Visual Acuity Right Eye Distance:   Left Eye Distance:   Bilateral Distance:    Right Eye Near:   Left Eye Near:    Bilateral Near:     Physical Exam Vitals and nursing note reviewed.  Constitutional:      General: She is not in acute distress. HENT:     Mouth/Throat:     Dentition: Abnormal dentition.     Pharynx: Oropharynx is clear.     Comments: Left lower most posterior molar with what appears to be overgrowing lingual gums (vs incomplete eruption?). Mild swelling and tenderness to overlying cheek. No submental swelling. No drooling or trismus.  Eyes:     Pupils: Pupils are equal, round, and reactive to light.  Cardiovascular:     Rate and Rhythm: Normal rate and regular rhythm.     Heart sounds: Normal heart sounds.  Pulmonary:     Effort: Pulmonary effort is normal.     Breath sounds: Normal breath sounds.  Musculoskeletal:     Thoracic back: Spasms and tenderness present.     Lumbar back: Tenderness present. No spasms. Negative right straight leg raise test and negative left straight leg raise test.     Comments: Bilateral lumbar paraspinal tenderness. Right upper thoracic tenderness, mainly over upper trapezius, with spasm.   Neurological:     Mental Status: She is alert.  Psychiatric:        Mood and Affect: Mood normal.      UC Treatments / Results  Labs (all labs ordered are listed, but only abnormal results are displayed) Labs Reviewed - No data to display  EKG   Radiology No results found.  Procedures Procedures (including critical care time)  Medications Ordered in UC Medications - No data to display  Initial Impression / Assessment and Plan / UC Course  I have reviewed the triage vital signs and  the nursing notes.  Pertinent labs & imaging results that were available during my care of the patient were reviewed by me and considered in my medical decision making (see chart for details).     No sign of severe dental abscess.  Final Clinical Impressions(s) / UC Diagnoses   Final diagnoses:  Chronic bilateral low back pain without sciatica  Upper back pain on right side  Muscle spasm of back  Dental disease     Discharge Instructions     Take medications as prescribed - Augmentin for possible tooth infection, diclofenac for pain, and baclofen for muscle spasms (may cause drowsiness). Rest, heat, and gentle stretches for back pain. Follow up with dentist or oral surgeon for tooth.     ED Prescriptions    None     PDMP not reviewed this encounter.   Estanislado Pandy, [place] 06/22/20 1904

## 2020-06-22 NOTE — ED Triage Notes (Signed)
Pt is here with on & off back pain that pt states it feels like a pinching nerves and shoots to shoulders and hip sometimes, this started 2 days ago. Pt is here also with dental pain that started this morning. Pt states her left side jaw/face is swollen and she has not been able to eat today.

## 2020-06-22 NOTE — Progress Notes (Signed)
Based on what you shared with me, I feel your condition warrants further evaluation and I recommend that you be seen in a face to face office visit.  Given you are having multiple symptoms, you need to be seen face to face to rule out more serious problems.    NOTE: If you entered your credit card information for this eVisit, you will not be charged. You may see a "hold" on your card for the $35 but that hold will drop off and you will not have a charge processed.   If you are having a true medical emergency please call 911.      For an urgent face to face visit, [facility] has six urgent care centers for your convenience:     [facility] at [facility] Directions [phone] [address] [place], [place] [address] . 8 am - 4 pm Monday - Friday    [facility]) Get Driving Directions [phone] [address] [place], [place] [address] . 8 am to 8 pm Monday-Friday . 10 am to 6 pm [facility]) Get Driving Directions [phone]  [address] [place],  [place]  [address] . 8 am to 8 pm Monday-Friday . 8 am to 4 pm [facility] at [facility] Get Driving Directions [phone] [address] [place], [place] [address] . 8 am to 8 pm Monday-Friday . 8 am to 4 pm [facility] at [facility] Get Driving Directions  [phone] [address] [place], [place] [address] . 8 am to 8 pm Monday-Friday . 8 am to 4 pm [facility] Urgent Care at [facility] Directions [phone] [address] [place], [place] [address] . 8 am to 8 pm Monday-Friday . 8 am to 4 pm Saturday-Sunday     Your MyChart E-visit questionnaire answers were reviewed by a board certified advanced clinical practitioner  to complete your personal care plan based on your specific symptoms.  Thank you for using e-Visits.

## 2020-06-23 ENCOUNTER — Telehealth: Payer: Medicaid Other | Admitting: Physician Assistant

## 2020-06-23 ENCOUNTER — Encounter: Payer: Self-pay | Admitting: Physician Assistant

## 2020-06-23 DIAGNOSIS — K047 Periapical abscess without sinus: Secondary | ICD-10-CM

## 2020-06-23 DIAGNOSIS — M545 Low back pain, unspecified: Secondary | ICD-10-CM | POA: Diagnosis not present

## 2020-06-23 MED ORDER — BACLOFEN 10 MG PO TABS: 10.0000 mg | ORAL_TABLET | Freq: Three times a day (TID) | ORAL | 0 refills | Status: DC | PRN

## 2020-06-23 MED ORDER — DICLOFENAC SODIUM 75 MG PO TBEC: 75.0000 mg | DELAYED_RELEASE_TABLET | Freq: Two times a day (BID) | ORAL | 0 refills | Status: DC

## 2020-06-23 MED ORDER — AMOXICILLIN-POT CLAVULANATE 875-125 MG PO TABS: 1.0000 | ORAL_TABLET | Freq: Two times a day (BID) | ORAL | 0 refills | Status: DC

## 2020-06-23 NOTE — Patient Instructions (Signed)
Instructions sent to patients MyChart.

## 2020-06-23 NOTE — Progress Notes (Signed)
Lisa Holt, Lisa Holt are scheduled for a virtual visit with your provider today.    Just as we do with appointments in the office, we must obtain your consent to participate.  Your consent will be active for this visit and any virtual visit you may have with one of our providers in the next 365 days.    If you have a MyChart account, I can also send a copy of this consent to you electronically.  All virtual visits are billed to your insurance company just like a traditional visit in the office.  As this is a virtual visit, video technology does not allow for your provider to perform a traditional examination.  This may limit your provider's ability to fully assess your condition.  If your provider identifies any concerns that need to be evaluated in person or the need to arrange testing such as labs, EKG, etc, we will make arrangements to do so.    Although advances in technology are sophisticated, we cannot ensure that it will always work on either your end or our end.  If the connection with a video visit is poor, we may have to switch to a telephone visit.  With either a video or telephone visit, we are not always able to ensure that we have a secure connection.   I need to obtain your verbal consent now.   Are you willing to proceed with your visit today?   Lisa Holt has provided verbal consent on 06/23/2020 for a virtual visit (video or telephone).  Piedad Climes, PA-C 06/23/2020  2:28 PM  Virtual Visit via Video   I connected with patient on 06/23/20 at  2:30 PM EDT by a video enabled telemedicine application and verified that I am speaking with the correct person using two identifiers.  Location patient: Home Location provider: [facility] Persons participating in the virtual visit: Patient, Provider  I discussed the limitations of evaluation and management by telemedicine and the availability of in person appointments. The patient expressed understanding and agreed to  proceed.  Subjective:   HPI:  Patient presents via Caregility today to discuss getting medication for her dental infection and back pain, after recent urgent care visit on 06/23/2020.  Patient states she has been dealing with bilateral lower back pain over the past few days.  No known trauma or injury but she does lift heavy boxes at work.  Endorses pain as a tightness and pulling sensation.  Denies numbness, tingling or weakness of lower extremities.  Will on very rare occasion have radiation of pain into her right buttock, not extending any further.  Also noticing tension in her right shoulder without overt pain.  Denies any change to bowel or bladder habits.  In terms of tooth pain, she endorses this is been present over the past few days and involves her left lower wisdom tooth.  Has noted redness and swelling of the gum around this tooth now extending to her cheek.  Denies fever or chills.  Per EMR review she was evaluated at [facility] urgent care yesterday, 06/22/2020 around 6 PM.  She was diagnosed with low back pain and odontogenic infection.  No further work-up was recommended at that time.  Note is not 100% complete on record reviewed but in AVS patient was instructed to take Augmentin as directed for dental pain and follow-up with a dentist/oral surgeon.  For low back pain she was encouraged to rest and apply heat to the area, as  well as to take baclofen and diclofenac as directed.  Per chart review medication was not actually submitted.  ROS:   See pertinent positives and negatives per HPI.  Patient Active Problem List   Diagnosis Date Noted  . Surveillance of previously prescribed implantable subdermal contraceptive 10/16/2019  . Right ovarian cyst 09/18/2018  . Sepsis (HCC) 01/21/2018  . Chest pain 01/21/2018  . Pancytopenia (HCC) 01/21/2018  . Hypokalemia 01/21/2018  . Hypomagnesemia 01/21/2018  . Influenza due to influenza virus, type B 01/20/2018  . HSIL (high grade squamous  intraepithelial lesion) on Pap smear of cervix 08/06/2015    Social History   Tobacco Use  . Smoking status: Current Every Day Smoker    Packs/day: 1.00    Types: Cigarettes    Last attempt to quit: 06/05/2013    Years since quitting: 7.0  . Smokeless tobacco: Never Used  Substance Use Topics  . Alcohol use: Yes    Comment: occ    Current Outpatient Medications:  .  Ferrous Sulfate (IRON PO), Take 1 tablet by mouth daily., Disp: , Rfl:  .  Multiple Vitamin (MULTIVITAMIN WITH MINERALS) TABS tablet, Take 1 tablet by mouth daily., Disp: , Rfl:   Allergies  Allergen Reactions  . Latex Itching and Swelling    Objective:   There were no vitals taken for this visit.  Patient is well-developed, well-nourished in no acute distress.  Resting comfortably at home.  Head is normocephalic, atraumatic.  No labored breathing.  Speech is clear and coherent with logical content.  Patient is alert and oriented at baseline.   Assessment and Plan:   1. Dental infection Plan to send in Augmentin that was not sent in by urgent care provider yesterday.  She is to take twice daily as directed with food for 10 days.  She does not have a dentist.  We will send some local resources to her MyChart.  If there is any worsening of the symptoms she needs to be reevaluated in person, at primary care provider's office of possible. - amoxicillin-clavulanate (AUGMENTIN) 875-125 MG tablet; Take 1 tablet by mouth 2 (two) times daily.  Dispense: 20 tablet; Refill: 0  2. Acute bilateral low back pain without sciatica No alarm signs or symptoms.  Agree with rice therapy.  Heat to lower back.  Gentle stretching.  We will send in the baclofen to use up to 3 times daily as needed and diclofenac to use twice daily as needed for the next few days to help calm symptoms down.  Strict follow-up precautions discussed with patient who voiced understanding and agreement with the plan. - baclofen (LIORESAL) 10 MG tablet; Take  1 tablet (10 mg total) by mouth 3 (three) times daily as needed for muscle spasms.  Dispense: 15 each; Refill: 0 - diclofenac (VOLTAREN) 75 MG EC tablet; Take 1 tablet (75 mg total) by mouth 2 (two) times daily.  Dispense: 20 tablet; Refill: 0  A copy of written instructions were sent to the patient's MyChart.  Work note was provided to the patient via Clinical cytogeneticist.  Piedad Climes, PA-C 06/23/2020

## 2020-10-23 ENCOUNTER — Other Ambulatory Visit: Payer: Self-pay | Admitting: Internal Medicine

## 2020-10-24 LAB — VITAMIN D 25 HYDROXY (VIT D DEFICIENCY, FRACTURES): Vit D, 25-Hydroxy: 72 ng/mL (ref 30–100)

## 2020-10-24 LAB — CBC
HCT: 41.7 % (ref 35.0–45.0)
Hemoglobin: 13.2 g/dL (ref 11.7–15.5)
MCH: 28.8 pg (ref 27.0–33.0)
MCHC: 31.7 g/dL — ABNORMAL LOW (ref 32.0–36.0)
MCV: 91 fL (ref 80.0–100.0)
MPV: 10.7 fL (ref 7.5–12.5)
Platelets: 160 10*3/uL (ref 140–400)
RBC: 4.58 10*6/uL (ref 3.80–5.10)
RDW: 12.9 % (ref 11.0–15.0)
WBC: 6.9 10*3/uL (ref 3.8–10.8)

## 2020-10-24 LAB — LIPID PANEL
Cholesterol: 151 mg/dL (ref <200)
HDL: 48 mg/dL — ABNORMAL LOW (ref >=50)
LDL Cholesterol (Calc): 91 mg/dL (calc)
Non-HDL Cholesterol (Calc): 103 mg/dL (calc) (ref <130)
Total CHOL/HDL Ratio: 3.1 (calc) (ref <5.0)
Triglycerides: 40 mg/dL (ref <150)

## 2020-10-24 LAB — COMPLETE METABOLIC PANEL WITH GFR
AG Ratio: 1.9 (calc) (ref 1.0–2.5)
ALT: 12 U/L (ref 6–29)
AST: 17 U/L (ref 10–30)
Albumin: 4.4 g/dL (ref 3.6–5.1)
Alkaline phosphatase (APISO): 40 U/L (ref 31–125)
BUN: 14 mg/dL (ref 7–25)
CO2: 21 mmol/L (ref 20–32)
Calcium: 9.4 mg/dL (ref 8.6–10.2)
Chloride: 106 mmol/L (ref 98–110)
Creat: 0.92 mg/dL (ref 0.50–0.96)
Globulin: 2.3 g/dL (calc) (ref 1.9–3.7)
Glucose, Bld: 78 mg/dL (ref 65–99)
Potassium: 3.5 mmol/L (ref 3.5–5.3)
Sodium: 139 mmol/L (ref 135–146)
Total Bilirubin: 0.5 mg/dL (ref 0.2–1.2)
Total Protein: 6.7 g/dL (ref 6.1–8.1)
eGFR: 88 mL/min/{1.73_m2} (ref >=60)

## 2020-10-24 LAB — TSH: TSH: 2.29 mIU/L

## 2020-11-22 ENCOUNTER — Telehealth: Payer: Medicaid Other | Admitting: Nurse Practitioner

## 2020-11-22 DIAGNOSIS — S6980XA Other specified injuries of unspecified wrist, hand and finger(s), initial encounter: Secondary | ICD-10-CM

## 2020-11-22 MED ORDER — IBUPROFEN 600 MG PO TABS: 600.0000 mg | ORAL_TABLET | Freq: Four times a day (QID) | ORAL | 0 refills | Status: DC | PRN

## 2020-11-22 NOTE — Patient Instructions (Addendum)
  [name], thank you for joining Claiborne Rigg, NP for today's virtual visit.  While this provider is not your primary care provider (PCP), if your PCP is located in our provider database this encounter information will be shared with them immediately following your visit.  Consent: (Patient) Lisa Holt provided verbal consent for this virtual visit at the beginning of the encounter.  Current Medications:  Current Outpatient Medications:    ibuprofen (ADVIL) 600 MG tablet, Take 1 tablet (600 mg total) by mouth every 6 (six) hours as needed., Disp: 60 tablet, Rfl: 0   amoxicillin-clavulanate (AUGMENTIN) 875-125 MG tablet, Take 1 tablet by mouth 2 (two) times daily., Disp: 20 tablet, Rfl: 0   baclofen (LIORESAL) 10 MG tablet, Take 1 tablet (10 mg total) by mouth 3 (three) times daily as needed for muscle spasms., Disp: 15 each, Rfl: 0   diclofenac (VOLTAREN) 75 MG EC tablet, Take 1 tablet (75 mg total) by mouth 2 (two) times daily., Disp: 20 tablet, Rfl: 0   Ferrous Sulfate (IRON PO), Take 1 tablet by mouth daily., Disp: , Rfl:    Multiple Vitamin (MULTIVITAMIN WITH MINERALS) TABS tablet, Take 1 tablet by mouth daily., Disp: , Rfl:    omeprazole (PRILOSEC) 20 MG capsule, Take 20 mg by mouth daily., Disp: , Rfl:    Vitamin D, Ergocalciferol, (DRISDOL) 1.25 MG (50000 UNIT) CAPS capsule, Take 50,000 Units by mouth every 7 (seven) days., Disp: , Rfl:    Medications ordered in this encounter:  Meds ordered this encounter  Medications   ibuprofen (ADVIL) 600 MG tablet    Sig: Take 1 tablet (600 mg total) by mouth every 6 (six) hours as needed.    Dispense:  60 tablet    Refill:  0    Order Specific Question:   Supervising Provider    Answer:   Hyacinth Meeker, BRIAN [[id number]]     *If you need refills on other medications prior to your next appointment, please contact your pharmacy*  Follow-Up: Call back or seek an in-person evaluation if the symptoms worsen or if the condition fails to  improve as anticipated.  Other Instructions 1. Hyperextension injury of thumb - ibuprofen (ADVIL) 600 MG tablet; Take 1 tablet (600 mg total) by mouth every 6 (six) hours as needed.  Dispense: 60 tablet; Refill: 0 Apply ice to affected area Wear splint or brace for the next 1-2 weeks Follow up with PCP in 2 weeks if no improvement  If you have been instructed to have an in-person evaluation today at a local Urgent Care facility, please use the link below. It will take you to a list of all of our available [facility], including address, phone number and hours of operation. Please do not delay care.  [facility]  If you or a family member do not have a primary care provider, use the link below to schedule a visit and establish care. When you choose a [facility] primary care physician or advanced practice provider, you gain a long-term partner in health. Find a Primary Care Provider  Learn more about [facility] in-office and virtual care options: [facility] - Get Care Now
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
## 2020-12-15 ENCOUNTER — Ambulatory Visit: Payer: Medicaid Other | Admitting: Internal Medicine

## 2021-01-07 ENCOUNTER — Ambulatory Visit (INDEPENDENT_AMBULATORY_CARE_PROVIDER_SITE_OTHER): Payer: Medicaid Other | Admitting: Internal Medicine

## 2021-01-07 ENCOUNTER — Encounter: Payer: Self-pay | Admitting: Internal Medicine

## 2021-01-07 VITALS — BP 100/62 | HR 105 | Ht 63.0 in | Wt 104.2 lb

## 2021-01-07 DIAGNOSIS — R109 Unspecified abdominal pain: Secondary | ICD-10-CM | POA: Diagnosis not present

## 2021-01-07 NOTE — Patient Instructions (Signed)
If you are age 28 or older, your body mass index should be between 23-30. Your Body mass index is 18.47 kg/m. If this is out of the aforementioned range listed, please consider follow up with your Primary Care Provider.  If you are age 73 or younger, your body mass index should be between 19-25. Your Body mass index is 18.47 kg/m. If this is out of the aformentioned range listed, please consider follow up with your Primary Care Provider.   ________________________________________________________  The [facility] providers would like to encourage you to use [facility] to communicate with providers for non-urgent requests or questions.  Due to long hold times on the telephone, sending your provider a message by [facility] may be a faster and more efficient way to get a response.  Please allow 48 business hours for a response.  Please remember that this is for non-urgent requests.  _______________________________________________________  Bonita Quin will be contacted by [facility] Scheduling in the next 2 days to arrange an abdominal ultrasound  The number on your caller ID will be [phone], please answer when they call.  If you have not heard from them in 2 days please call [phone] to schedule.    You have been scheduled for an endoscopy. Please follow written instructions given to you at your visit today. If you use inhalers (even only as needed), please bring them with you on the day of your procedure.

## 2021-01-07 NOTE — Progress Notes (Signed)
HISTORY OF PRESENT ILLNESS:  Lisa Holt is a 30 y.o. female with past medical history as listed below.  [facility].  She is accompanied by her mother.  Patient reports a 5-year history of problems with abdominal pain.  This since the birth of her child.  She has difficulties at least once per week.  She describes the discomfort as either abdominal swelling or cramping.  Typically at the midline or lower.  Pain may last anywhere from 20 minutes to 2 hours.  She typically needs to lie down.  When she has the pain, it is exacerbated by meals, walking around, or activity.  She states that this has affected her ability to work.  She tells me that she was diagnosed with acid reflux and placed on omeprazole.  She takes 20 mg daily.  Has been doing so for some time.  She has multiple NSAIDs listed on her medication list but tells me she is not taking these.  Her last gynecology checkup was August 2021.  She has had multiple imaging studies performed to evaluate her abdominal complaints.  She does complain of nausea with decreased appetite but no vomiting.  Her bowel habits are regular.  No GI bleeding.  She denies prior GI evaluation.  Review of blood work from October 23, 2020 shows normal comprehensive metabolic panel.  Normal CBC with hemoglobin 13.2.  Normal TSH.  Normal liver test.  CT scan of the abdomen without contrast September 2021.  No abnormality.  REVIEW OF SYSTEMS:  All non-GI ROS negative unless otherwise stated in the HPI except for anxiety, back pain, fatigue, muscle cramps, headaches, sleeping problems  Past Medical History:  Diagnosis Date   Allergic rhinitis    Anemia    Anorexia    Anxiety    GERD (gastroesophageal reflux disease)    Ovarian cyst    Post partum depression    was prescribed meds, took 2, then stopped   Thyroglossal cyst    Vitamin D deficiency     Past Surgical History:  Procedure Laterality Date    WISDOM TOOTH EXTRACTION      Social History Lisa Holt  reports that she has been smoking cigarettes. She has been smoking an average of 1 pack per day. She has never used smokeless tobacco. She reports current alcohol use. She reports that she does not use drugs.  family history includes Carpal tunnel syndrome in her mother; Diabetes in her father; Hypertension in her father; Stroke in her father; Thyroid nodules in her mother.  Allergies  Allergen Reactions   Latex Itching and Swelling       PHYSICAL EXAMINATION: Vital signs: BP 100/62   Pulse (!) 105   Ht 5\' 3"  (1.6 m)   Wt 104 lb 4 oz (47.3 kg)   BMI 18.47 kg/m   Constitutional: generally well-appearing, no acute distress Psychiatric: alert and oriented x3, cooperative Eyes: extraocular movements intact, anicteric, conjunctiva pink Mouth: oral pharynx moist, no lesions Neck: supple no lymphadenopathy Cardiovascular: heart regular rate and rhythm, no murmur Lungs: clear to auscultation bilaterally Abdomen: soft, nontender, nondistended, no obvious ascites, no peritoneal signs, normal bowel sounds, no organomegaly Rectal: Omitted Extremities: no clubbing, cyanosis, or lower extremity edema bilaterally Skin: no lesions on visible extremities save multiple tattoos Neuro: No focal deficits.  Cranial nerves intact  ASSESSMENT:  1.  Chronic abdominal pain (5 years duration) as described.  Symptoms persist despite PPI.  Etiology unclear.  Rule out GI.  Rule out GYN.  Rule out functional.  Favor the latter. 2.  History of anxiety and depression  PLAN:  1.  Continue PPI for now 2.  Schedule abdominal ultrasound 3.  Schedule diagnostic upper endoscopy.The nature of the procedure, as well as the risks, benefits, and alternatives were carefully and thoroughly reviewed with the patient. Ample time for discussion and questions allowed. The patient understood, was satisfied, and agreed to proceed.  4.  Recommended that she  return to her gynecologist for a comprehensive evaluation.  She agrees 5.  If the above work-up unrevealing, could consider an as needed antispasmodic to see if this helps 6.  Ongoing general medical care with her PCP, Dr. Concepcion Elk A total time of 60 minutes was spent preparing to see the patient, reviewing outside clinical evaluations, laboratories, and x-rays.  Obtaining comprehensive history, performing medically appropriate physical examination, counseling the patient and her mother regarding the above listed issues, ordering advanced radiology studies and endoscopic procedure.  Finally, documenting clinical information in the health record

## 2021-01-10 DIAGNOSIS — M79641 Pain in right hand: Secondary | ICD-10-CM | POA: Insufficient documentation

## 2021-01-10 DIAGNOSIS — M6530 Trigger finger, unspecified finger: Secondary | ICD-10-CM | POA: Insufficient documentation

## 2021-01-12 ENCOUNTER — Encounter (HOSPITAL_COMMUNITY): Payer: Self-pay | Admitting: Emergency Medicine

## 2021-01-12 ENCOUNTER — Emergency Department (HOSPITAL_COMMUNITY): Payer: Medicaid Other

## 2021-01-12 ENCOUNTER — Emergency Department (HOSPITAL_COMMUNITY)
Admission: EM | Admit: 2021-01-12 | Discharge: 2021-01-12 | Disposition: A | Discharge status: Home / Self Care | Payer: Medicaid Other | Attending: Emergency Medicine | Admitting: Emergency Medicine

## 2021-01-12 ENCOUNTER — Telehealth: Payer: Medicaid Other | Admitting: Family

## 2021-01-12 DIAGNOSIS — R0981 Nasal congestion: Secondary | ICD-10-CM | POA: Diagnosis not present

## 2021-01-12 DIAGNOSIS — Z91040 Latex allergy status: Secondary | ICD-10-CM | POA: Insufficient documentation

## 2021-01-12 DIAGNOSIS — J069 Acute upper respiratory infection, unspecified: Secondary | ICD-10-CM | POA: Diagnosis not present

## 2021-01-12 DIAGNOSIS — Z20822 Contact with and (suspected) exposure to covid-19: Secondary | ICD-10-CM | POA: Diagnosis not present

## 2021-01-12 DIAGNOSIS — R059 Cough, unspecified: Secondary | ICD-10-CM | POA: Diagnosis not present

## 2021-01-12 DIAGNOSIS — F17210 Nicotine dependence, cigarettes, uncomplicated: Secondary | ICD-10-CM | POA: Diagnosis not present

## 2021-01-12 DIAGNOSIS — M791 Myalgia, unspecified site: Secondary | ICD-10-CM | POA: Diagnosis not present

## 2021-01-12 LAB — RESP PANEL BY RT-PCR (FLU A&B, COVID) ARPGX2
Influenza A by PCR: NEGATIVE
Influenza B by PCR: NEGATIVE
SARS Coronavirus 2 by RT PCR: NEGATIVE

## 2021-01-12 MED ORDER — ALBUTEROL SULFATE HFA 108 (90 BASE) MCG/ACT IN AERS: 2.0000 | INHALATION_SPRAY | Freq: Once | RESPIRATORY_TRACT | Status: DC

## 2021-01-12 MED ORDER — ACETAMINOPHEN 500 MG PO TABS
1000.0000 mg | ORAL_TABLET | Freq: Once | ORAL | Status: AC
  Administered 2021-01-12: 1000 mg via ORAL
  Filled 2021-01-12: qty 2

## 2021-01-12 MED ORDER — ALBUTEROL SULFATE HFA 108 (90 BASE) MCG/ACT IN AERS: 2.0000 | INHALATION_SPRAY | RESPIRATORY_TRACT | 1 refills | Status: DC | PRN

## 2021-01-12 MED ORDER — GUAIFENESIN-DM 100-10 MG/5ML PO SYRP
15.0000 mL | ORAL_SOLUTION | ORAL | Status: DC | PRN
  Administered 2021-01-12: 15 mL via ORAL
  Filled 2021-01-12: qty 5
  Filled 2021-01-12: qty 15

## 2021-01-12 NOTE — Progress Notes (Signed)
Virtual Visit Consent   Lisa Holt, you are scheduled for a virtual visit with a [facility] provider today.     Just as with appointments in the office, your consent must be obtained to participate.  Your consent will be active for this visit and any virtual visit you may have with one of our providers in the next 365 days.     If you have a MyChart account, a copy of this consent can be sent to you electronically.  All virtual visits are billed to your insurance company just like a traditional visit in the office.    As this is a virtual visit, video technology does not allow for your provider to perform a traditional examination.  This may limit your provider's ability to fully assess your condition.  If your provider identifies any concerns that need to be evaluated in person or the need to arrange testing (such as labs, EKG, etc.), we will make arrangements to do so.     Although advances in technology are sophisticated, we cannot ensure that it will always work on either your end or our end.  If the connection with a video visit is poor, the visit may have to be switched to a telephone visit.  With either a video or telephone visit, we are not always able to ensure that we have a secure connection.     I need to obtain your verbal consent now.   Are you willing to proceed with your visit today?    Lisa Holt has provided verbal consent on 01/12/2021 for a virtual visit (video or telephone).   Jannifer Rodney, FNP   Date: 01/12/2021 6:07 PM   Virtual Visit via Video Note   I, Jannifer Rodney, connected with  Lisa Holt  ([id number], 01-Aug-1990) on 01/12/21 at  6:00 PM EST by a video-enabled telemedicine application and verified that I am speaking with the correct person using two identifiers.  Location: Patient: Virtual Visit Location Patient: Home Provider: Virtual Visit Location Provider: Home Office   I discussed the limitations of evaluation and management by  telemedicine and the availability of in person appointments. The patient expressed understanding and agreed to proceed.    History of Present Illness: Lisa Holt is a 30 y.o. who identifies as a female who was assigned female at birth, and is being seen today for cough. She went to the ED today and was diagnosed with URI. She was negative for COVID. She was given cough medication. She reports she is having a lot of cough and congestion.   HPI: Cough This is a new problem. The current episode started in the past 7 days. The problem has been waxing and waning. The cough is Productive of sputum. Associated symptoms include chills, ear pain, a fever, headaches, myalgias, nasal congestion and shortness of breath. Pertinent negatives include no chest pain. She has tried rest for the symptoms.   Problems:  Patient Active Problem List   Diagnosis Date Noted   Surveillance of previously prescribed implantable subdermal contraceptive 10/16/2019   Right ovarian cyst 09/18/2018   Sepsis (HCC) 01/21/2018   Chest pain 01/21/2018   Pancytopenia (HCC) 01/21/2018   Hypokalemia 01/21/2018   Hypomagnesemia 01/21/2018   Influenza due to influenza virus, type B 01/20/2018   HSIL (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 08/06/2015    Allergies:  Allergies  Allergen Reactions   Latex Itching and Swelling   Medications:  Current Outpatient  Medications:    albuterol (VENTOLIN HFA) 108 (90 Base) MCG/ACT inhaler, Inhale 2 puffs into the lungs every 4 (four) hours as needed for wheezing or shortness of breath., Disp: 1 each, Rfl: 1   cetirizine (ZYRTEC) 10 MG tablet, Take 10 mg by mouth daily., Disp: , Rfl:    diclofenac (VOLTAREN) 75 MG EC tablet, Take 1 tablet (75 mg total) by mouth 2 (two) times daily., Disp: 20 tablet, Rfl: 0   dicyclomine (BENTYL) 10 MG capsule, Take 10 mg by mouth 4 (four) times daily -  before meals and at bedtime., Disp: , Rfl:    hydrOXYzine  (ATARAX/VISTARIL) 25 MG tablet, Take 25 mg by mouth 2 (two) times daily as needed., Disp: , Rfl:    ibuprofen (ADVIL) 600 MG tablet, Take 1 tablet (600 mg total) by mouth every 6 (six) hours as needed., Disp: 60 tablet, Rfl: 0   medroxyPROGESTERone (DEPO-PROVERA) 150 MG/ML injection, Inject 150 mg into the muscle every 3 (three) months., Disp: , Rfl:    meloxicam (MOBIC) 7.5 MG tablet, Take 7.5 mg by mouth daily., Disp: , Rfl:    Multiple Vitamin (MULTIVITAMIN WITH MINERALS) TABS tablet, Take 1 tablet by mouth daily., Disp: , Rfl:    omeprazole (PRILOSEC) 20 MG capsule, Take 20 mg by mouth daily., Disp: , Rfl:    Vitamin D, Ergocalciferol, (DRISDOL) 1.25 MG (50000 UNIT) CAPS capsule, Take 50,000 Units by mouth every 7 (seven) days., Disp: , Rfl:  No current facility-administered medications for this visit.  Facility-Administered Medications Ordered in Other Visits:    albuterol (VENTOLIN HFA) 108 (90 Base) MCG/ACT inhaler 2 puff, 2 puff, Inhalation, Once, Cathren Laine, MD   guaiFENesin-dextromethorphan (ROBITUSSIN DM) 100-10 MG/5ML syrup 15 mL, 15 mL, Oral, Q4H PRN, Cathren Laine, MD, 15 mL at 01/12/21 1121  Observations/Objective: Patient is well-developed, well-nourished in no acute distress.  Resting comfortably in her bed Head is normocephalic, atraumatic.  No labored breathing.  Speech is clear and coherent with logical content.  Patient is alert and oriented at baseline.  Patient had intermittent cough  Assessment and Plan: 1. Viral URI Force fluids Continue zyrtec and cough medication Tylenol as needed  Rest Alternate motrin and tylenol  Reassurance given   Follow Up Instructions: I discussed the assessment and treatment plan with the patient. The patient was provided an opportunity to ask questions and all were answered. The patient agreed with the plan and demonstrated an understanding of the instructions.  A copy of instructions were sent to the patient via MyChart unless  otherwise noted below.     The patient was advised to call back or seek an in-person evaluation if the symptoms worsen or if the condition fails to improve as anticipated.  Time:  I spent 9 minutes with the patient via telehealth technology discussing the above problems/concerns.    Jannifer Rodney, FNP

## 2021-01-12 NOTE — ED Provider Notes (Signed)
[facility] EMERGENCY DEPARTMENT Provider Note   CSN: [id number] Arrival date & time: 01/12/21  6063     History Chief Complaint  Patient presents with   Cough   Generalized Body Aches    Lisa Holt is a 30 y.o. female.  Patient c/o non prod cough, chest soreness w coughing, nasal congestion, and body aches. Symptoms acute onset in past couple days, moderate, constant, persistent. No sore throat or trouble swallowing. No sob. No hx asthma. +smoker. No severe headaches. No neck pain or stiffness. No ear pain. No exertional, episodic, or pleuritic chest pain. No leg pain or swelling. No hx dvt or pe. No hx cad, or fam hx cad/premature cad. No chest wall injury or strain. No heartburn. No abd pain or nvd. No dysuria or gu c/o. No known covid exposure. Pts child w flu ~ 3 weeks ago.   The history is provided by the patient and medical records.      Past Medical History:  Diagnosis Date   Allergic rhinitis    Anemia    Anorexia    Anxiety    GERD (gastroesophageal reflux disease)    Ovarian cyst    Post partum depression    was prescribed meds, took 2, then stopped   Thyroglossal cyst    Vitamin D deficiency     Patient Active Problem List   Diagnosis Date Noted   Surveillance of previously prescribed implantable subdermal contraceptive 10/16/2019   Right ovarian cyst 09/18/2018   Sepsis (HCC) 01/21/2018   Chest pain 01/21/2018   Pancytopenia (HCC) 01/21/2018   Hypokalemia 01/21/2018   Hypomagnesemia 01/21/2018   Influenza due to influenza virus, type B 01/20/2018   HSIL (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 08/06/2015    Past Surgical History:  Procedure Laterality Date   WISDOM TOOTH EXTRACTION       OB History     Gravida  2   Para  2   Term  2   Preterm      AB      Living  2      SAB      IAB      Ectopic      Multiple  0   Live Births  2           Family History  Problem  Relation Age of Onset   Carpal tunnel syndrome Mother    Thyroid nodules Mother    Hypertension Father    Diabetes Father    Stroke Father     Social History   Tobacco Use   Smoking status: Every Day    Packs/day: 1.00    Types: Cigarettes    Last attempt to quit: 06/05/2013    Years since quitting: 7.6   Smokeless tobacco: Never  Substance Use Topics   Alcohol use: Yes    Comment: occ   Drug use: No    Types: Marijuana, Cocaine    Comment: las Cocaine use Nov. 2016, last marijuana 02/22/15    Home Medications Prior to Admission medications   Medication Sig Start Date End Date Taking? Authorizing Provider  amoxicillin-clavulanate (AUGMENTIN) 875-125 MG tablet Take 1 tablet by mouth 2 (two) times daily. 06/23/20   Waldon Merl, PA-C  baclofen (LIORESAL) 10 MG tablet Take 1 tablet (10 mg total) by mouth 3 (three) times daily as needed for muscle spasms. 06/23/20   Waldon Merl, PA-C  cetirizine (ZYRTEC) 10 MG  tablet Take 10 mg by mouth daily.    [provider]  diclofenac (VOLTAREN) 75 MG EC tablet Take 1 tablet (75 mg total) by mouth 2 (two) times daily. 06/23/20   Waldon Merl, PA-C  dicyclomine (BENTYL) 10 MG capsule Take 10 mg by mouth 4 (four) times daily -  before meals and at bedtime.    [provider]  hydrOXYzine (ATARAX/VISTARIL) 25 MG tablet Take 25 mg by mouth 2 (two) times daily as needed.    [provider]  ibuprofen (ADVIL) 600 MG tablet Take 1 tablet (600 mg total) by mouth every 6 (six) hours as needed. 11/22/20   Claiborne Rigg, NP  medroxyPROGESTERone (DEPO-PROVERA) 150 MG/ML injection Inject 150 mg into the muscle every 3 (three) months.    [provider]  meloxicam (MOBIC) 7.5 MG tablet Take 7.5 mg by mouth daily.    [provider]  Multiple Vitamin (MULTIVITAMIN WITH MINERALS) TABS tablet Take 1 tablet by mouth daily.    [provider]  omeprazole (PRILOSEC) 20 MG capsule Take 20 mg by mouth  daily.    [provider]  Vitamin D, Ergocalciferol, (DRISDOL) 1.25 MG (50000 UNIT) CAPS capsule Take 50,000 Units by mouth every 7 (seven) days.    [provider]    Allergies    Latex  Review of Systems   Review of Systems  Constitutional:  Negative for chills and fever.  HENT:  Positive for congestion and rhinorrhea. Negative for sinus pressure and trouble swallowing.   Eyes:  Negative for pain, redness and visual disturbance.  Respiratory:  Positive for cough. Negative for shortness of breath.   Cardiovascular:  Positive for chest pain. Negative for palpitations and leg swelling.  Gastrointestinal:  Negative for abdominal pain, diarrhea and vomiting.  Genitourinary:  Negative for dysuria and flank pain.  Musculoskeletal:  Positive for myalgias. Negative for neck pain and neck stiffness.  Skin:  Negative for rash.  Neurological:  Negative for headaches.  Hematological:  Does not bruise/bleed easily.  Psychiatric/Behavioral:  Negative for confusion.    Physical Exam Updated Vital Signs BP 105/72 (BP Location: Left Arm)   Pulse 94   Temp 99.4 F (37.4 C) (Oral)   Resp 20   SpO2 100%   Physical Exam Vitals and nursing note reviewed.  Constitutional:      Appearance: Normal appearance. She is well-developed.  HENT:     Head: Atraumatic.     Nose: Nose normal.     Mouth/Throat:     Mouth: Mucous membranes are moist.     Pharynx: Oropharynx is clear. No oropharyngeal exudate or posterior oropharyngeal erythema.  Eyes:     General: No scleral icterus.    Conjunctiva/sclera: Conjunctivae normal.     Pupils: Pupils are equal, round, and reactive to light.  Neck:     Trachea: No tracheal deviation.     Comments: No stiffness or rigidity. Cardiovascular:     Rate and Rhythm: Normal rate and regular rhythm.     Pulses: Normal pulses.     Heart sounds: Normal heart sounds. No murmur heard.   No friction rub. No gallop.  Pulmonary:     Effort: Pulmonary  effort is normal. No respiratory distress.     Breath sounds: Normal breath sounds.  Abdominal:     General: Bowel sounds are normal. There is no distension.     Palpations: Abdomen is soft.     Tenderness: There is no abdominal tenderness.  Comments: No hsm.   Genitourinary:    Comments: No cva tenderness.  Musculoskeletal:        General: No swelling.     Cervical back: Normal range of motion and neck supple. No rigidity or tenderness. No muscular tenderness.  Lymphadenopathy:     Cervical: No cervical adenopathy.  Skin:    General: Skin is warm and dry.     Findings: No rash.  Neurological:     Mental Status: She is alert.     Comments: Alert, speech normal. Steady gait.   Psychiatric:        Mood and Affect: Mood normal.    ED Results / Procedures / Treatments   Labs (all labs ordered are listed, but only abnormal results are displayed) Results for orders placed or performed during the hospital encounter of 01/12/21  Resp Panel by RT-PCR (Flu A&B, Covid) Nasopharyngeal Swab   Specimen: Nasopharyngeal Swab; Nasopharyngeal(NP) swabs in vial transport medium  Result Value Ref Range   SARS Coronavirus 2 by RT PCR NEGATIVE NEGATIVE   Influenza A by PCR NEGATIVE NEGATIVE   Influenza B by PCR NEGATIVE NEGATIVE   DG Chest 2 View  Result Date: 01/12/2021 CLINICAL DATA:  Cough and chest pain EXAM: CHEST - 2 VIEW COMPARISON:  01/20/2018 FINDINGS: The heart size and mediastinal contours are within normal limits. Both lungs are clear. The visualized skeletal structures are unremarkable. IMPRESSION: No acute abnormality of the lungs. Electronically Signed   By: Jearld Lesch M.D.   On: 01/12/2021 10:29    EKG None  Radiology DG Chest 2 View  Result Date: 01/12/2021 CLINICAL DATA:  Cough and chest pain EXAM: CHEST - 2 VIEW COMPARISON:  01/20/2018 FINDINGS: The heart size and mediastinal contours are within normal limits. Both lungs are clear. The visualized skeletal structures  are unremarkable. IMPRESSION: No acute abnormality of the lungs. Electronically Signed   By: Jearld Lesch M.D.   On: 01/12/2021 10:29    Procedures Procedures   Medications Ordered in ED Medications  albuterol (VENTOLIN HFA) 108 (90 Base) MCG/ACT inhaler 2 puff (has no administration in time range)  guaiFENesin-dextromethorphan (ROBITUSSIN DM) 100-10 MG/5ML syrup 15 mL (15 mLs Oral Given 01/12/21 1121)  acetaminophen (TYLENOL) tablet 1,000 mg (1,000 mg Oral Given 01/12/21 1004)    ED Course  I have reviewed the triage vital signs and the nursing notes.  Pertinent labs & imaging results that were available during my care of the patient were reviewed by me and considered in my medical decision making (see chart for details).    MDM Rules/Calculators/A&P                          Labs sent. Imaging ordered.   Reviewed nursing notes and prior charts for additional history.   Xrays reviewed/interpreted by me - no pna.   Labs reviewed/interpreted by me - covid and flu neg.   Mild wheezing, albuterol tx.   Acetaminophen po. Po fluids.   Rec pcp f/u.  Return precautions provided.      Final Clinical Impression(s) / ED Diagnoses Final diagnoses:  None    Rx / DC Orders ED Discharge Orders     None        Cathren Laine, MD 01/12/21 1250

## 2021-01-12 NOTE — Discharge Instructions (Signed)
It was our pleasure to provide your ER care today - we hope that you feel better.  Your chest xray shows no pneumonia, and your flu/covid test is negative.   Drink plenty of fluids/stay well hydrated. You may take over the counter cold/flu med as need for symptom relief. Use albuterol inhaler as need if wheezing. Avoid any smoking.   Follow up with primary care doctor in one week if symptoms fail to improve/resolve.  Return to ER if worse, new symptoms, increased trouble breathing, new/severe pain, recurrent/persistent chest pain, or other concern.

## 2021-01-12 NOTE — ED Triage Notes (Signed)
Pt. Stated, I got a cough, chest , back pain started 3 days ago.

## 2021-01-17 ENCOUNTER — Telehealth: Payer: Medicaid Other | Admitting: Emergency Medicine

## 2021-01-17 DIAGNOSIS — N898 Other specified noninflammatory disorders of vagina: Secondary | ICD-10-CM | POA: Diagnosis not present

## 2021-01-17 MED ORDER — CLOTRIMAZOLE 1 % VA CREA: 1.0000 | TOPICAL_CREAM | Freq: Every day | VAGINAL | 1 refills | Status: AC

## 2021-01-17 MED ORDER — FLUCONAZOLE 150 MG PO TABS: ORAL_TABLET | ORAL | 0 refills | Status: DC

## 2021-01-17 NOTE — Patient Instructions (Signed)
[name], thank you for joining [place], PA-C for today's virtual visit.  While this provider is not your primary care provider (PCP), if your PCP is located in our provider database this encounter information will be shared with them immediately following your visit.  Consent: (Patient) Lisa Holt provided verbal consent for this virtual visit at the beginning of the encounter.  Current Medications:  Current Outpatient Medications:    clotrimazole (GYNE-LOTRIMIN) 1 % vaginal cream, Place 1 Applicatorful vaginally at bedtime for 7 days., Disp: 45 g, Rfl: 1   fluconazole (DIFLUCAN) 150 MG tablet, Take one dose by mouth, wait 72 hours, and then take second dose by mouth, Disp: 1 tablet, Rfl: 0   albuterol (VENTOLIN HFA) 108 (90 Base) MCG/ACT inhaler, Inhale 2 puffs into the lungs every 4 (four) hours as needed for wheezing or shortness of breath., Disp: 1 each, Rfl: 1   cetirizine (ZYRTEC) 10 MG tablet, Take 10 mg by mouth daily., Disp: , Rfl:    diclofenac (VOLTAREN) 75 MG EC tablet, Take 1 tablet (75 mg total) by mouth 2 (two) times daily., Disp: 20 tablet, Rfl: 0   dicyclomine (BENTYL) 10 MG capsule, Take 10 mg by mouth 4 (four) times daily -  before meals and at bedtime., Disp: , Rfl:    hydrOXYzine (ATARAX/VISTARIL) 25 MG tablet, Take 25 mg by mouth 2 (two) times daily as needed., Disp: , Rfl:    ibuprofen (ADVIL) 600 MG tablet, Take 1 tablet (600 mg total) by mouth every 6 (six) hours as needed., Disp: 60 tablet, Rfl: 0   medroxyPROGESTERone (DEPO-PROVERA) 150 MG/ML injection, Inject 150 mg into the muscle every 3 (three) months., Disp: , Rfl:    meloxicam (MOBIC) 7.5 MG tablet, Take 7.5 mg by mouth daily., Disp: , Rfl:    Multiple Vitamin (MULTIVITAMIN WITH MINERALS) TABS tablet, Take 1 tablet by mouth daily., Disp: , Rfl:    omeprazole (PRILOSEC) 20 MG capsule, Take 20 mg by mouth daily., Disp: , Rfl:    Vitamin D, Ergocalciferol, (DRISDOL) 1.25 MG (50000 UNIT) CAPS  capsule, Take 50,000 Units by mouth every 7 (seven) days., Disp: , Rfl:    Medications ordered in this encounter:  Meds ordered this encounter  Medications   fluconazole (DIFLUCAN) 150 MG tablet    Sig: Take one dose by mouth, wait 72 hours, and then take second dose by mouth    Dispense:  1 tablet    Refill:  0    Order Specific Question:   Supervising Provider    Answer:   Hyacinth Meeker, BRIAN [[id number]]   clotrimazole (GYNE-LOTRIMIN) 1 % vaginal cream    Sig: Place 1 Applicatorful vaginally at bedtime for 7 days.    Dispense:  45 g    Refill:  1    Order Specific Question:   Supervising Provider    Answer:   Hyacinth Meeker, BRIAN [[id number]]     *If you need refills on other medications prior to your next appointment, please contact your pharmacy*  Follow-Up: Call back or seek an in-person evaluation if the symptoms worsen or if the condition fails to improve as anticipated.  Other Instructions Based on symptoms I will go ahead and treat you for possible yeast infection Prescribed diflucan take as directed and to completion Also prescribed clotrimazole.  Use as directed Follow up with PCP if symptoms persists Follow up in person at urgent care or go to ER if you have any new or worsening symptoms such as fever,  vomiting, abdominal pain, vaginal pain, discharge, etc...   If you have been instructed to have an in-person evaluation today at a local Urgent Care facility, please use the link below. It will take you to a list of all of our available [facility], including address, phone number and hours of operation. Please do not delay care.  [facility]  If you or a family member do not have a primary care provider, use the link below to schedule a visit and establish care. When you choose a [facility] primary care physician or advanced practice provider, you gain a long-term partner in health. Find a Primary Care Provider  Learn more about [facility] in-office and virtual  care options: [facility] - Get Care Now

## 2021-01-17 NOTE — Progress Notes (Signed)
Virtual Visit Consent   Lisa Holt, you are scheduled for a virtual visit with a [facility] provider today.     Just as with appointments in the office, your consent must be obtained to participate.  Your consent will be active for this visit and any virtual visit you may have with one of our providers in the next 365 days.     If you have a MyChart account, a copy of this consent can be sent to you electronically.  All virtual visits are billed to your insurance company just like a traditional visit in the office.    As this is a virtual visit, video technology does not allow for your provider to perform a traditional examination.  This may limit your provider's ability to fully assess your condition.  If your provider identifies any concerns that need to be evaluated in person or the need to arrange testing (such as labs, EKG, etc.), we will make arrangements to do so.     Although advances in technology are sophisticated, we cannot ensure that it will always work on either your end or our end.  If the connection with a video visit is poor, the visit may have to be switched to a telephone visit.  With either a video or telephone visit, we are not always able to ensure that we have a secure connection.     I need to obtain your verbal consent now.   Are you willing to proceed with your visit today? Yes   Lisa Holt has provided verbal consent on 01/17/2021 for a virtual visit (video or telephone).   Rennis Harding, [place]   Date: 01/17/2021 6:38 PM   Virtual Visit via Video Note   I, Rennis Harding, connected with  Lisa Holt  ([id number], [birth year]) on 01/17/21 at  6:30 PM EST by a video-enabled telemedicine application and verified that I am speaking with the correct person using two identifiers.  Location: Patient: Virtual Visit Location Patient: Home Provider: Virtual Visit Location Provider: Home Office   I discussed the limitations of evaluation and management by  telemedicine and the availability of in person appointments. The patient expressed understanding and agreed to proceed.    History of Present Illness: Lisa Holt is a 30 y.o. who identifies as a female who was assigned female at birth, and is being seen today for vaginal itching x 8 days.  She denies a precipitating event, recent sexual encounter or recent antibiotic use.  She has tried OTC treatments without relief.  She reports worsening symptoms with itching.  She denies similar symptoms in the past.  She denies fever, chills, nausea, vomiting, vaginal discharge, vaginal odor, vaginal bleeding, dyspareunia, vaginal lesions.   Denies pregnancy.    HPI: HPI  Problems:  Patient Active Problem List   Diagnosis Date Noted   Surveillance of previously prescribed implantable subdermal contraceptive 10/16/2019   Right ovarian cyst 09/18/2018   Sepsis (HCC) 01/21/2018   Chest pain 01/21/2018   Pancytopenia (HCC) 01/21/2018   Hypokalemia 01/21/2018   Hypomagnesemia 01/21/2018   Influenza due to influenza virus, type B 01/20/2018   HSIL (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 08/06/2015    Allergies:  Allergies  Allergen Reactions   Latex Itching and Swelling   Medications:  Current Outpatient Medications:    fluconazole (DIFLUCAN) 150 MG tablet, Take one dose by mouth, wait 72 hours, and then take second dose by mouth, Disp: 1 tablet, Rfl:  0   albuterol (VENTOLIN HFA) 108 (90 Base) MCG/ACT inhaler, Inhale 2 puffs into the lungs every 4 (four) hours as needed for wheezing or shortness of breath., Disp: 1 each, Rfl: 1   cetirizine (ZYRTEC) 10 MG tablet, Take 10 mg by mouth daily., Disp: , Rfl:    diclofenac (VOLTAREN) 75 MG EC tablet, Take 1 tablet (75 mg total) by mouth 2 (two) times daily., Disp: 20 tablet, Rfl: 0   dicyclomine (BENTYL) 10 MG capsule, Take 10 mg by mouth 4 (four) times daily -  before meals and at bedtime., Disp: , Rfl:    hydrOXYzine  (ATARAX/VISTARIL) 25 MG tablet, Take 25 mg by mouth 2 (two) times daily as needed., Disp: , Rfl:    ibuprofen (ADVIL) 600 MG tablet, Take 1 tablet (600 mg total) by mouth every 6 (six) hours as needed., Disp: 60 tablet, Rfl: 0   medroxyPROGESTERone (DEPO-PROVERA) 150 MG/ML injection, Inject 150 mg into the muscle every 3 (three) months., Disp: , Rfl:    meloxicam (MOBIC) 7.5 MG tablet, Take 7.5 mg by mouth daily., Disp: , Rfl:    Multiple Vitamin (MULTIVITAMIN WITH MINERALS) TABS tablet, Take 1 tablet by mouth daily., Disp: , Rfl:    omeprazole (PRILOSEC) 20 MG capsule, Take 20 mg by mouth daily., Disp: , Rfl:    Vitamin D, Ergocalciferol, (DRISDOL) 1.25 MG (50000 UNIT) CAPS capsule, Take 50,000 Units by mouth every 7 (seven) days., Disp: , Rfl:   Observations/Objective: Patient is well-developed, well-nourished in no acute distress.  Resting comfortably at home.  Head is normocephalic, atraumatic.  No labored breathing.  Speech is clear and coherent with logical content.  Patient is alert and oriented at baseline.    Assessment and Plan: 1. Vaginal itching Based on symptoms I will go ahead and treat you for possible yeast infection Prescribed diflucan take as directed and to completion Also prescribed clotrimazole.  Use as directed Follow up with PCP if symptoms persists Follow up in person at urgent care or go to ER if you have any new or worsening symptoms such as fever, vomiting, abdominal pain, vaginal pain, discharge, etc...  Follow Up Instructions: I discussed the assessment and treatment plan with the patient. The patient was provided an opportunity to ask questions and all were answered. The patient agreed with the plan and demonstrated an understanding of the instructions.  A copy of instructions were sent to the patient via MyChart unless otherwise noted below.   The patient was advised to call back or seek an in-person evaluation if the symptoms worsen or if the condition  fails to improve as anticipated.  Time:  I spent 5-10 minutes with the patient via telehealth technology discussing the above problems/concerns.    Rennis Harding, PA-C

## 2021-01-18 ENCOUNTER — Ambulatory Visit (HOSPITAL_COMMUNITY): Admission: RE | Admit: 2021-01-18 | Payer: Medicaid Other | Source: Ambulatory Visit

## 2021-02-08 ENCOUNTER — Ambulatory Visit (AMBULATORY_SURGERY_CENTER): Payer: Medicaid Other | Admitting: Internal Medicine

## 2021-02-08 ENCOUNTER — Encounter: Payer: Self-pay | Admitting: Internal Medicine

## 2021-02-08 ENCOUNTER — Other Ambulatory Visit: Payer: Self-pay

## 2021-02-08 VITALS — BP 120/66 | HR 54 | Temp 98.0°F | Resp 12 | Ht 63.0 in | Wt 104.0 lb

## 2021-02-08 DIAGNOSIS — R109 Unspecified abdominal pain: Secondary | ICD-10-CM

## 2021-02-08 MED ORDER — SODIUM CHLORIDE 0.9 % IV SOLN: 500.0000 mL | Freq: Once | INTRAVENOUS | Status: DC

## 2021-02-08 NOTE — Progress Notes (Signed)
Pt in recovery with monitors in place, VSS. Report given to receiving RN. Bite guard was placed with pt awake to ensure comfort. No dental or soft tissue damage noted. 

## 2021-02-08 NOTE — Patient Instructions (Addendum)
Office to call and schedule abdominal ultrasound.  YOU HAD AN ENDOSCOPIC PROCEDURE TODAY AT THE [facility]:   Refer to the procedure report that was given to you for any specific questions about what was found during the examination.  If the procedure report does not answer your questions, please call your gastroenterologist to clarify.  If you requested that your care partner not be given the details of your procedure findings, then the procedure report has been included in a sealed envelope for you to review at your convenience later.  YOU SHOULD EXPECT: Some feelings of bloating in the abdomen. Passage of more gas than usual.  Walking can help get rid of the air that was put into your GI tract during the procedure and reduce the bloating. If you had a lower endoscopy (such as a colonoscopy or flexible sigmoidoscopy) you may notice spotting of blood in your stool or on the toilet paper. If you underwent a bowel prep for your procedure, you may not have a normal bowel movement for a few days.  Please Note:  You might notice some irritation and congestion in your nose or some drainage.  This is from the oxygen used during your procedure.  There is no need for concern and it should clear up in a day or so.  SYMPTOMS TO REPORT IMMEDIATELY:  Following upper endoscopy (EGD)  Vomiting of blood or coffee ground material  New chest pain or pain under the shoulder blades  Painful or persistently difficult swallowing  New shortness of breath  Fever of 100F or higher  Black, tarry-looking stools  For urgent or emergent issues, a gastroenterologist can be reached at any hour by calling [phone]. Do not use MyChart messaging for urgent concerns.    DIET:  We do recommend a small meal at first, but then you may proceed to your regular diet.  Drink plenty of fluids but you should avoid alcoholic beverages for 24 hours.  ACTIVITY:  You should plan to take it easy for the rest of today  and you should NOT DRIVE or use heavy machinery until tomorrow (because of the sedation medicines used during the test).    FOLLOW UP: Our staff will call the number listed on your records 48-72 hours following your procedure to check on you and address any questions or concerns that you may have regarding the information given to you following your procedure. If we do not reach you, we will leave a message.  We will attempt to reach you two times.  During this call, we will ask if you have developed any symptoms of COVID 19. If you develop any symptoms (ie: fever, flu-like symptoms, shortness of breath, cough etc.) before then, please call [phone].  If you test positive for Covid 19 in the 2 weeks post procedure, please call and report this information to [place].    If any biopsies were taken you will be contacted by phone or by letter within the next 1-3 weeks.  Please call us at [phone] if you have not heard about the biopsies in 3 weeks.    SIGNATURES/CONFIDENTIALITY: You and/or your care partner have signed paperwork which will be entered into your electronic medical record.  These signatures attest to the fact that that the information above on your After Visit Summary has been reviewed and is understood.  Full responsibility of the confidentiality of this discharge information lies with you and/or your care-partner.

## 2021-02-08 NOTE — Progress Notes (Signed)
PREPROCEDURE H&P  This patient was seen in the office 1 month ago for abdominal pain.  See the complete H&P.  No interval change.  She is now for upper endoscopy.

## 2021-02-08 NOTE — Progress Notes (Signed)
Pt's states no medical or surgical changes since previsit or office visit. 

## 2021-02-08 NOTE — Op Note (Signed)
[facility] Patient Name: Lisa Holt Procedure Date: 02/08/2021 10:37 AM MRN: [id number] Endoscopist: Wilhemina Bonito. Marina Goodell , MD Age: 30 Referring MD:  Date of Birth: [birth year] Gender: Female Account #: [id number] Procedure:                Upper GI endoscopy Indications:              Abdominal pain Medicines:                Monitored Anesthesia Care Procedure:                Pre-Anesthesia Assessment:                           - Prior to the procedure, a History and Physical                            was performed, and patient medications and                            allergies were reviewed. The patient's tolerance of                            previous anesthesia was also reviewed. The risks                            and benefits of the procedure and the sedation                            options and risks were discussed with the patient.                            All questions were answered, and informed consent                            was obtained. Prior Anticoagulants: The patient has                            taken no previous anticoagulant or antiplatelet                            agents. ASA Grade Assessment: I - A normal, healthy                            patient. After reviewing the risks and benefits,                            the patient was deemed in satisfactory condition to                            undergo the procedure.                           After obtaining informed consent, the endoscope was  passed under direct vision. Throughout the                            procedure, the patient's blood pressure, pulse, and                            oxygen saturations were monitored continuously. The                            GIF [id number] #[id number] was introduced through the                            mouth, and advanced to the second part of duodenum.                            The upper GI endoscopy was accomplished without                             difficulty. The patient tolerated the procedure                            well. Scope In: Scope Out: Findings:                 The esophagus was normal.                           The stomach was normal.                           The examined duodenum was normal.                           The cardia and gastric fundus were normal on                            retroflexion. Complications:            No immediate complications. Estimated Blood Loss:     Estimated blood loss: none. Impression:               1. Normal EGD                           2. No cause for pain found. Recommendation:           - Patient has a contact number available for                            emergencies. The signs and symptoms of potential                            delayed complications were discussed with the                            patient. Return to normal activities tomorrow.  Written discharge instructions were provided to the                            patient.                           - Resume previous diet.                           - Continue present medications.                           - Make sure you have a full evaluation with your                            gynecologist to assess for gynecologic causes for                            pain                           - SCHEDULE abdominal ultrasound "abdominal pain,                            evaluate". We will contact you with results after                            they are available Wilhemina Bonito. Marina Goodell, MD 02/08/2021 11:05:28 AM This report has been signed electronically.

## 2021-02-09 ENCOUNTER — Other Ambulatory Visit: Payer: Self-pay

## 2021-02-09 DIAGNOSIS — R109 Unspecified abdominal pain: Secondary | ICD-10-CM

## 2021-02-10 ENCOUNTER — Telehealth: Payer: Self-pay | Admitting: *Deleted

## 2021-02-10 NOTE — Telephone Encounter (Signed)
°  Follow up Call-  Call back number 02/08/2021  Post procedure Call Back phone  # [phone]  Permission to leave phone message Yes  Some recent data might be hidden   [facility]

## 2021-02-10 NOTE — Telephone Encounter (Signed)
°  Follow up Call-  Call back number 02/08/2021  Post procedure Call Back phone  # [phone]  Permission to leave phone message Yes  Some recent data might be hidden     Patient questions:  Do you have a fever, pain , or abdominal swelling? No. Pain Score  0 *  Have you tolerated food without any problems? Yes.    Have you been able to return to your normal activities? Yes.    Do you have any questions about your discharge instructions: Diet   No. Medications  No. Follow up visit  No.  Do you have questions or concerns about your Care? No.  Actions: * If pain score is 4 or above: No action needed, pain <4.  Have you developed a fever since your procedure? no  2.   Have you had an respiratory symptoms (SOB or cough) since your procedure? no  3.   Have you tested positive for COVID 19 since your procedure no  4.   Have you had any family members/close contacts diagnosed with the COVID 19 since your procedure?  no   If yes to any of these questions please route to Laverna Peace, RN and Karlton Lemon, RN

## 2021-02-18 ENCOUNTER — Ambulatory Visit (HOSPITAL_COMMUNITY): Payer: Medicaid Other

## 2021-09-11 ENCOUNTER — Telehealth: Payer: Medicaid Other | Admitting: Nurse Practitioner

## 2021-09-11 DIAGNOSIS — H6012 Cellulitis of left external ear: Secondary | ICD-10-CM

## 2021-09-11 MED ORDER — CEPHALEXIN 500 MG PO CAPS: 500.0000 mg | ORAL_CAPSULE | Freq: Three times a day (TID) | ORAL | 0 refills | Status: DC

## 2021-09-11 NOTE — Progress Notes (Signed)
Virtual Visit Consent   Lisa Holt, you are scheduled for a virtual visit with Mary-Margaret Daphine Deutscher, FNP, a [facility] provider, today.     Just as with appointments in the office, your consent must be obtained to participate.  Your consent will be active for this visit and any virtual visit you may have with one of our providers in the next 365 days.     If you have a MyChart account, a copy of this consent can be sent to you electronically.  All virtual visits are billed to your insurance company just like a traditional visit in the office.    As this is a virtual visit, video technology does not allow for your provider to perform a traditional examination.  This may limit your provider's ability to fully assess your condition.  If your provider identifies any concerns that need to be evaluated in person or the need to arrange testing (such as labs, EKG, etc.), we will make arrangements to do so.     Although advances in technology are sophisticated, we cannot ensure that it will always work on either your end or our end.  If the connection with a video visit is poor, the visit may have to be switched to a telephone visit.  With either a video or telephone visit, we are not always able to ensure that we have a secure connection.     I need to obtain your verbal consent now.   Are you willing to proceed with your visit today? YES   Lisa Holt has provided verbal consent on 09/11/2021 for a virtual visit (video or telephone).   Mary-Margaret Daphine Deutscher, FNP   Date: 09/11/2021 6:14 PM   Virtual Visit via Video Note   I, Mary-Margaret Daphine Deutscher, connected with Lisa Holt ([id number], 18-Feb-1991) on 09/11/21 at  6:30 PM EDT by a video-enabled telemedicine application and verified that I am speaking with the correct person using two identifiers.  Location: Patient: Virtual Visit Location Patient: Home Provider: Virtual Visit Location Provider: Mobile   I discussed the limitations of  evaluation and management by telemedicine and the availability of in person appointments. The patient expressed understanding and agreed to proceed.    History of Present Illness: Lisa Holt is a 31 y.o. who identifies as a female who was female at birth, and is being seen today for fever and ear pain  HPI: Patient noticed that she had a sore lesion on auricle of her  ear. Very sore to touch. No drainage. Has fever develop today of 101.4 and mild swelling around left ear.    Review of Systems  Constitutional:  Positive for chills and fever.  HENT:  Positive for ear pain. Negative for congestion, ear discharge and sore throat.   Respiratory: Negative.    Neurological: Negative.     Problems:  Patient Active Problem List   Diagnosis Date Noted   Surveillance of previously prescribed implantable subdermal contraceptive 10/16/2019   Right ovarian cyst 09/18/2018   Sepsis (HCC) 01/21/2018   Chest pain 01/21/2018   Pancytopenia (HCC) 01/21/2018   Hypokalemia 01/21/2018   Hypomagnesemia 01/21/2018   Influenza due to influenza virus, type B 01/20/2018   HSIL (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 08/06/2015    Allergies:  Allergies  Allergen Reactions   Latex Itching and Swelling   Medications:  Current Outpatient Medications:    albuterol (VENTOLIN HFA) 108 (90 Base) MCG/ACT inhaler, Inhale 2 puffs  into the lungs every 4 (four) hours as needed for wheezing or shortness of breath., Disp: 1 each, Rfl: 1   cetirizine (ZYRTEC) 10 MG tablet, Take 10 mg by mouth daily., Disp: , Rfl:    diclofenac (VOLTAREN) 75 MG EC tablet, Take 1 tablet (75 mg total) by mouth 2 (two) times daily., Disp: 20 tablet, Rfl: 0   dicyclomine (BENTYL) 10 MG capsule, Take 10 mg by mouth 4 (four) times daily -  before meals and at bedtime., Disp: , Rfl:    hydrOXYzine (ATARAX/VISTARIL) 25 MG tablet, Take 25 mg by mouth 2 (two) times daily as needed., Disp: , Rfl:    ibuprofen  (ADVIL) 600 MG tablet, Take 1 tablet (600 mg total) by mouth every 6 (six) hours as needed., Disp: 60 tablet, Rfl: 0   medroxyPROGESTERone (DEPO-PROVERA) 150 MG/ML injection, Inject 150 mg into the muscle every 3 (three) months., Disp: , Rfl:    meloxicam (MOBIC) 7.5 MG tablet, Take 7.5 mg by mouth daily., Disp: , Rfl:    Multiple Vitamin (MULTIVITAMIN WITH MINERALS) TABS tablet, Take 1 tablet by mouth daily., Disp: , Rfl:    omeprazole (PRILOSEC) 20 MG capsule, Take 20 mg by mouth daily., Disp: , Rfl:    Vitamin D, Ergocalciferol, (DRISDOL) 1.25 MG (50000 UNIT) CAPS capsule, Take 50,000 Units by mouth every 7 (seven) days., Disp: , Rfl:   Observations/Objective: Patient is well-developed, well-nourished in no acute distress.  Resting comfortably  at home.  Head is normocephalic, atraumatic.  No labored breathing.  Speech is clear and coherent with logical content.  Patient is alert and oriented at baseline.  Unable to see lesion in video  Assessment and Plan:  Lisa Holt in today with chief complaint of Fever and Ear Pain   1. Cellulitis of left pinna Cool compresses Do not pick at area If not improving will need face to face visit.  Meds ordered this encounter  Medications   cephALEXin (KEFLEX) 500 MG capsule    Sig: Take 1 capsule (500 mg total) by mouth 3 (three) times daily.    Dispense:  21 capsule    Refill:  0    Order Specific Question:   Supervising Provider    Answer:   Eber Hong [[id number]]      Follow Up Instructions: I discussed the assessment and treatment plan with the patient. The patient was provided an opportunity to ask questions and all were answered. The patient agreed with the plan and demonstrated an understanding of the instructions.  A copy of instructions were sent to the patient via MyChart.  The patient was advised to call back or seek an in-person evaluation if the symptoms worsen or if the condition fails to improve as anticipated.  Time:   I spent 7 minutes with the patient via telehealth technology discussing the above problems/concerns.    Mary-Margaret Daphine Deutscher, FNP

## 2021-09-12 ENCOUNTER — Emergency Department (HOSPITAL_COMMUNITY)
Admission: EM | Admit: 2021-09-12 | Discharge: 2021-09-12 | Disposition: A | Discharge status: Home / Self Care | Payer: Medicaid Other | Attending: Emergency Medicine | Admitting: Emergency Medicine

## 2021-09-12 ENCOUNTER — Encounter (HOSPITAL_COMMUNITY): Payer: Self-pay

## 2021-09-12 ENCOUNTER — Encounter: Payer: Self-pay | Admitting: Nurse Practitioner

## 2021-09-12 ENCOUNTER — Other Ambulatory Visit: Payer: Self-pay

## 2021-09-12 ENCOUNTER — Telehealth: Payer: Medicaid Other

## 2021-09-12 ENCOUNTER — Other Ambulatory Visit: Payer: Self-pay | Admitting: Nurse Practitioner

## 2021-09-12 DIAGNOSIS — H6012 Cellulitis of left external ear: Secondary | ICD-10-CM

## 2021-09-12 DIAGNOSIS — Z91040 Latex allergy status: Secondary | ICD-10-CM | POA: Insufficient documentation

## 2021-09-12 DIAGNOSIS — H6002 Abscess of left external ear: Secondary | ICD-10-CM | POA: Insufficient documentation

## 2021-09-12 DIAGNOSIS — H9202 Otalgia, left ear: Secondary | ICD-10-CM | POA: Diagnosis present

## 2021-09-12 MED ORDER — IBUPROFEN 600 MG PO TABS: 600.0000 mg | ORAL_TABLET | Freq: Four times a day (QID) | ORAL | 0 refills | Status: DC | PRN

## 2021-09-12 MED ORDER — CEPHALEXIN 500 MG PO CAPS: 500.0000 mg | ORAL_CAPSULE | Freq: Three times a day (TID) | ORAL | 0 refills | Status: DC

## 2021-09-12 MED ORDER — DOXYCYCLINE HYCLATE 100 MG PO CAPS
100.0000 mg | ORAL_CAPSULE | Freq: Two times a day (BID) | ORAL | 0 refills | Status: DC
Start: 2021-09-12 — End: 2021-11-02

## 2021-09-12 MED ORDER — DOXYCYCLINE HYCLATE 100 MG PO CAPS: 100.0000 mg | ORAL_CAPSULE | Freq: Two times a day (BID) | ORAL | 0 refills | Status: DC

## 2021-09-12 MED ORDER — LIDOCAINE HCL 2 % IJ SOLN
5.0000 mL | Freq: Once | INTRAMUSCULAR | Status: AC
  Administered 2021-09-12: 100 mg via INTRADERMAL
  Filled 2021-09-12: qty 20

## 2021-09-12 NOTE — Progress Notes (Signed)
I have spent 5 minutes in review of e-visit questionnaire, review and updating patient chart, medical decision making and response to patient.  ° °Elyse Prevo W Keldan Eplin, NP ° °  °

## 2021-09-12 NOTE — Discharge Instructions (Addendum)
Please apply warm moist compress to affected area several times daily to help aid with healing.  Take antibiotic as prescribed.  Please be aware do not take this antibiotic if you are pregnant.  Take ibuprofen as needed for pain.

## 2021-09-12 NOTE — ED Triage Notes (Signed)
Pt reports left ear pain x 3 days. Also admits to left sided neck pain. Suspected she had slept on it wrong.   Pt contacted PCP and received Keflex Rx. Then was directed to ER for ear to be evaluated prior to taking meds and r/o meningitis.

## 2021-09-12 NOTE — Progress Notes (Signed)
I S/W Lisa Holt over the phone. Unfortunately she sent an evisit after hours as she needed her prescription to be sent to an alternate pharmacy. At this time she will be picking her abx up from the pharmacy it was initially sent to . 

## 2021-09-12 NOTE — ED Provider Notes (Signed)
[facility]-EMERGENCY DEPT Provider Note   CSN: [id number] Arrival date & time: 09/12/21  2102     History  Chief Complaint  Patient presents with   Ear Pain    Lisa Holt is a 31 y.o. female.  The history is provided by the patient. No language interpreter was used.    31 year old female presenting complaining of ear pain.  Patient report for the past 3 days she noticed pain inside her left ear.  Pain is sharp and achy worse when she pushed on her ear.  She did not notice any drainage no fever no jaw pain or dental pain.  No significant change in her hearing.  She reports she did had a crick in her neck several days prior but felt that it was from the way that she slept.  She did contact her PCP but was told to come to the ER for evaluation and to rule out meningitis.  She does not endorse any sensitivity, severe headache or fever.  Home Medications Prior to Admission medications   Medication Sig Start Date End Date Taking? Authorizing Provider  albuterol (VENTOLIN HFA) 108 (90 Base) MCG/ACT inhaler Inhale 2 puffs into the lungs every 4 (four) hours as needed for wheezing or shortness of breath. 01/12/21   Cathren Laine, MD  cephALEXin (KEFLEX) 500 MG capsule Take 1 capsule (500 mg total) by mouth 3 (three) times daily. 09/12/21   Claiborne Rigg, NP  cetirizine (ZYRTEC) 10 MG tablet Take 10 mg by mouth daily.    [provider]  diclofenac (VOLTAREN) 75 MG EC tablet Take 1 tablet (75 mg total) by mouth 2 (two) times daily. 06/23/20   Waldon Merl, PA-C  dicyclomine (BENTYL) 10 MG capsule Take 10 mg by mouth 4 (four) times daily -  before meals and at bedtime.    [provider]  hydrOXYzine (ATARAX/VISTARIL) 25 MG tablet Take 25 mg by mouth 2 (two) times daily as needed.    [provider]  ibuprofen (ADVIL) 600 MG tablet Take 1 tablet (600 mg total) by mouth every 6 (six) hours as needed. 11/22/20   Claiborne Rigg, NP   medroxyPROGESTERone (DEPO-PROVERA) 150 MG/ML injection Inject 150 mg into the muscle every 3 (three) months.    [provider]  meloxicam (MOBIC) 7.5 MG tablet Take 7.5 mg by mouth daily.    [provider]  Multiple Vitamin (MULTIVITAMIN WITH MINERALS) TABS tablet Take 1 tablet by mouth daily.    [provider]  omeprazole (PRILOSEC) 20 MG capsule Take 20 mg by mouth daily.    [provider]  Vitamin D, Ergocalciferol, (DRISDOL) 1.25 MG (50000 UNIT) CAPS capsule Take 50,000 Units by mouth every 7 (seven) days.    [provider]      Allergies    Latex    Review of Systems   Review of Systems  All other systems reviewed and are negative.   Physical Exam Updated Vital Signs BP 114/84 (BP Location: Left Arm)   Pulse 99   Temp 99 F (37.2 C) (Oral)   Resp 16   Ht 5\' 3"  (1.6 m)   Wt 47 kg   LMP 09/07/2021 (Exact Date)   SpO2 99%   BMI 18.35 kg/m  Physical Exam Vitals and nursing note reviewed.  Constitutional:      General: She is not in acute distress.    Appearance: She is well-developed.  HENT:     Head: Atraumatic.  Right Ear: Tympanic membrane normal.     Left Ear: Tympanic membrane normal.     Ears:     Comments: Left ear: There is an area of induration noted to the roof of the left ear canal with tenderness to palpation.  Tenderness to manipulation of the tragus and the earlobe.  Normal TM.  Right ear: Normal ear canal, normal TM. Eyes:     Conjunctiva/sclera: Conjunctivae normal.  Pulmonary:     Effort: Pulmonary effort is normal.  Musculoskeletal:     Cervical back: Normal range of motion and neck supple. No rigidity.  Skin:    Findings: No rash.  Neurological:     Mental Status: She is alert and oriented to person, place, and time.  Psychiatric:        Mood and Affect: Mood normal.     ED Results / Procedures / Treatments   Labs (all labs ordered are listed, but only abnormal results are  displayed) Labs Reviewed - No data to display  EKG None  Radiology No results found.  Procedures .Marland KitchenIncision and Drainage  Date/Time: 09/12/2021 11:19 PM  Performed by: Fayrene Helper, PA-C Authorized by: Fayrene Helper, PA-C   Consent:    Consent obtained:  Verbal   Consent given by:  Patient   Risks discussed:  Bleeding, incomplete drainage, pain and damage to other organs   Alternatives discussed:  No treatment Universal protocol:    Procedure explained and questions answered to patient or proxy's satisfaction: yes     Relevant documents present and verified: yes     Test results available : yes     Imaging studies available: yes     Required blood products, implants, devices, and special equipment available: yes     Site/side marked: yes     Immediately prior to procedure, a time out was called: yes     Patient identity confirmed:  Verbally with patient Location:    Type:  Abscess   Size:  1   Location:  Head   Head location:  L external auditory canal Pre-procedure details:    Skin preparation:  Betadine Anesthesia:    Anesthesia method:  Local infiltration   Local anesthetic:  Lidocaine 1% w/o epi Procedure type:    Complexity:  Simple Procedure details:    Incision types:  Stab incision   Incision depth:  Subcutaneous   Drainage:  Purulent   Drainage amount:  Scant   Packing materials:  None Post-procedure details:    Procedure completion:  Tolerated     Medications Ordered in ED Medications  lidocaine (XYLOCAINE) 2 % (with pres) injection 100 mg (100 mg Intradermal Given 09/12/21 2324)    ED Course/ Medical Decision Making/ A&P                           Medical Decision Making  BP 114/84 (BP Location: Left Arm)   Pulse 99   Temp 99 F (37.2 C) (Oral)   Resp 16   Ht 5\' 3"  (1.6 m)   Wt 47 kg   LMP 09/07/2021 (Exact Date)   SpO2 99%   BMI 18.35 kg/m   10:26 PM Patient endorsed having pain in her left ear ongoing for the past 3 days.  She also  reported having a neck crick several days prior from sleeping wrong.  She does not endorse any fever, headache, hearing changes, jaw pain, ear drainage or dental pain.  No trauma.  On exam, it appears patient has a cutaneous abscess noted to the roof of her ear canal at the entrance.  This is more likely to be a cutaneous abscess as opposed to otitis externa.  Normal TM, doubt otitis media.  This will be best treated with incision and drainage.  Patient does not have any nuchal rigidity and no findings to suggest meningitis.  11:20 PM I have performed incision and drainage of this cutaneous abscess in the left external auditory canal with success.  Will discharge home with antibiotic and care instruction provided.  Patient reports she is currently not pregnant.  Patient made aware that antibiotic can cough or if she is pregnant.  I have considered patient's social determinant of health which includes tobacco abuse and recommend tobacco cessation.  After treatment patient report feeling better.        Final Clinical Impression(s) / ED Diagnoses Final diagnoses:  Abscess of left ear canal    Rx / DC Orders ED Discharge Orders          Ordered    doxycycline (VIBRAMYCIN) 100 MG capsule  2 times daily        09/12/21 2323    ibuprofen (ADVIL) 600 MG tablet  Every 6 hours PRN        09/12/21 2323              Domenic Moras, PA-C 09/12/21 2331    Ezequiel Essex, MD 09/13/21 1531

## 2021-09-12 NOTE — Addendum Note (Signed)
Addended by: Bertram Denver on: 09/12/2021 07:26 AM   Modules accepted: Level of Service

## 2021-09-12 NOTE — Progress Notes (Signed)
I S/W Lisa Holt over the phone. Unfortunately she sent an evisit after hours as she needed her prescription to be sent to an alternate pharmacy. At this time she will be picking her abx up from the pharmacy it was initially sent to .

## 2021-09-12 NOTE — Progress Notes (Signed)
I have spent 5 minutes in review of e-visit questionnaire, review and updating patient chart, medical decision making and response to patient.  ° °Clifton Safley W Quincy Prisco, NP ° °  °

## 2021-09-12 NOTE — Telephone Encounter (Signed)
Requesting cephalexin be sent to a different pharmacy. She states she spoke with a nurse representative from [facility] and was instructed to go to the urgent care or emergency room to be evaluated for possible meningitis. States she does not have a ride at this time however as soon as her mother gets home she is going to [facility] urgent care or the emergency room. She is aware of the hours at the urgent care today.

## 2021-11-02 ENCOUNTER — Telehealth: Payer: Medicaid Other | Admitting: Physician Assistant

## 2021-11-02 DIAGNOSIS — R1030 Lower abdominal pain, unspecified: Secondary | ICD-10-CM | POA: Diagnosis not present

## 2021-11-02 NOTE — Progress Notes (Signed)
Virtual Visit Consent   Lisa Holt, you are scheduled for a virtual visit with a  provider today. Just as with appointments in the office, your consent must be obtained to participate. Your consent will be active for this visit and any virtual visit you may have with one of our providers in the next 365 days. If you have a MyChart account, a copy of this consent can be sent to you electronically.  As this is a virtual visit, video technology does not allow for your provider to perform a traditional examination. This may limit your provider's ability to fully assess your condition. If your provider identifies any concerns that need to be evaluated in person or the need to arrange testing (such as labs, EKG, etc.), we will make arrangements to do so. Although advances in technology are sophisticated, we cannot ensure that it will always work on either your end or our end. If the connection with a video visit is poor, the visit may have to be switched to a telephone visit. With either a video or telephone visit, we are not always able to ensure that we have a secure connection.  By engaging in this virtual visit, you consent to the provision of healthcare and authorize for your insurance to be billed (if applicable) for the services provided during this visit. Depending on your insurance coverage, you may receive a charge related to this service.  I need to obtain your verbal consent now. Are you willing to proceed with your visit today? Lisa Holt has provided verbal consent on 11/02/2021 for a virtual visit (video or telephone). Piedad Climes, [place]  Date: 11/02/2021 1:15 PM  Virtual Visit via Video Note   I, Piedad Climes, connected with  Lisa Holt  ([id number], 1990-02-26) on 11/02/21 at  1:15 PM EDT by a video-enabled telemedicine application and verified that I am speaking with the correct person using two identifiers.  Location: Patient: Virtual Visit Location  Patient: Home Provider: Virtual Visit Location Provider: Home Office   I discussed the limitations of evaluation and management by telemedicine and the availability of in person appointments. The patient expressed understanding and agreed to proceed.    History of Present Illness: Lisa Holt is a 31 y.o. who identifies as a female who was assigned female at birth, and is being seen today for more significant lower abdominal pain starting today. Notes this morning felt a little sick on the stomach. Took a nap and when waking, she felt better. Then got up to use the bathroom. Notes after a BM having development of more substantial lower abdominal pain, sharp at times and at other times.  Couple episodes of non-bloody emesis. Denies concern for pregnancy and LMP within past 2 weeks.  HPI: HPI  Problems:  Patient Active Problem List   Diagnosis Date Noted   Surveillance of previously prescribed implantable subdermal contraceptive 10/16/2019   Right ovarian cyst 09/18/2018   Chest pain 01/21/2018   Hypokalemia 01/21/2018   Hypomagnesemia 01/21/2018   Influenza due to influenza virus, type B 01/20/2018   HSIL (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 08/06/2015    Allergies:  Allergies  Allergen Reactions   Latex Itching and Swelling   Medications:  Current Outpatient Medications:    albuterol (VENTOLIN HFA) 108 (90 Base) MCG/ACT inhaler, Inhale 2 puffs into the lungs every 4 (four) hours as needed for wheezing or shortness of breath., Disp: 1 each, Rfl: 1  cetirizine (ZYRTEC) 10 MG tablet, Take 10 mg by mouth daily., Disp: , Rfl:    diclofenac (VOLTAREN) 75 MG EC tablet, Take 1 tablet (75 mg total) by mouth 2 (two) times daily., Disp: 20 tablet, Rfl: 0   dicyclomine (BENTYL) 10 MG capsule, Take 10 mg by mouth 4 (four) times daily -  before meals and at bedtime., Disp: , Rfl:    hydrOXYzine (ATARAX/VISTARIL) 25 MG tablet, Take 25 mg by mouth 2 (two) times daily  as needed., Disp: , Rfl:    ibuprofen (ADVIL) 600 MG tablet, Take 1 tablet (600 mg total) by mouth every 6 (six) hours as needed., Disp: 30 tablet, Rfl: 0   medroxyPROGESTERone (DEPO-PROVERA) 150 MG/ML injection, Inject 150 mg into the muscle every 3 (three) months., Disp: , Rfl:    meloxicam (MOBIC) 7.5 MG tablet, Take 7.5 mg by mouth daily., Disp: , Rfl:    Multiple Vitamin (MULTIVITAMIN WITH MINERALS) TABS tablet, Take 1 tablet by mouth daily., Disp: , Rfl:    omeprazole (PRILOSEC) 20 MG capsule, Take 20 mg by mouth daily., Disp: , Rfl:    Vitamin D, Ergocalciferol, (DRISDOL) 1.25 MG (50000 UNIT) CAPS capsule, Take 50,000 Units by mouth every 7 (seven) days., Disp: , Rfl:   Observations/Objective: Patient is well-developed, well-nourished in no acute distress.  Resting comfortably at home.  Head is normocephalic, atraumatic.  No labored breathing. Speech is clear and coherent with logical content.  Patient is alert and oriented at baseline.   Assessment and Plan: 1. Lower abdominal pain  Sudden onset after BM. Did have some preceding queasiness but otherwise well. Nothing abnormal about BM. Still with significant lower abdominal pain, nausea and vomiting. History of ovarian cyst. Needs further evaluation. Sent her to UC for initial evaluation for exam, UA, etc. Strict ER precautions given.   Follow Up Instructions: I discussed the assessment and treatment plan with the patient. The patient was provided an opportunity to ask questions and all were answered. The patient agreed with the plan and demonstrated an understanding of the instructions.  A copy of instructions were sent to the patient via MyChart unless otherwise noted below.   The patient was advised to call back or seek an in-person evaluation if the symptoms worsen or if the condition fails to improve as anticipated.  Time:  I spent 10 minutes with the patient via telehealth technology discussing the above problems/concerns.     Piedad Climes, PA-C

## 2021-11-02 NOTE — Patient Instructions (Addendum)
  Lisa Holt, thank you for joining Lisa Climes, PA-C for today's virtual visit.  While this provider is not your primary care provider (PCP), if your PCP is located in our provider database this encounter information will be shared with them immediately following your visit.  Consent: (Patient) Lisa Holt provided verbal consent for this virtual visit at the beginning of the encounter.  Current Medications:  Current Outpatient Medications:    albuterol (VENTOLIN HFA) 108 (90 Base) MCG/ACT inhaler, Inhale 2 puffs into the lungs every 4 (four) hours as needed for wheezing or shortness of breath., Disp: 1 each, Rfl: 1   cephALEXin (KEFLEX) 500 MG capsule, Take 1 capsule (500 mg total) by mouth 3 (three) times daily., Disp: 21 capsule, Rfl: 0   cetirizine (ZYRTEC) 10 MG tablet, Take 10 mg by mouth daily., Disp: , Rfl:    diclofenac (VOLTAREN) 75 MG EC tablet, Take 1 tablet (75 mg total) by mouth 2 (two) times daily., Disp: 20 tablet, Rfl: 0   dicyclomine (BENTYL) 10 MG capsule, Take 10 mg by mouth 4 (four) times daily -  before meals and at bedtime., Disp: , Rfl:    doxycycline (VIBRAMYCIN) 100 MG capsule, Take 1 capsule (100 mg total) by mouth 2 (two) times daily. One po bid x 7 days, Disp: 14 capsule, Rfl: 0   hydrOXYzine (ATARAX/VISTARIL) 25 MG tablet, Take 25 mg by mouth 2 (two) times daily as needed., Disp: , Rfl:    ibuprofen (ADVIL) 600 MG tablet, Take 1 tablet (600 mg total) by mouth every 6 (six) hours as needed., Disp: 30 tablet, Rfl: 0   medroxyPROGESTERone (DEPO-PROVERA) 150 MG/ML injection, Inject 150 mg into the muscle every 3 (three) months., Disp: , Rfl:    meloxicam (MOBIC) 7.5 MG tablet, Take 7.5 mg by mouth daily., Disp: , Rfl:    Multiple Vitamin (MULTIVITAMIN WITH MINERALS) TABS tablet, Take 1 tablet by mouth daily., Disp: , Rfl:    omeprazole (PRILOSEC) 20 MG capsule, Take 20 mg by mouth daily., Disp: , Rfl:    Vitamin D, Ergocalciferol, (DRISDOL) 1.25 MG (50000  UNIT) CAPS capsule, Take 50,000 Units by mouth every 7 (seven) days., Disp: , Rfl:    Medications ordered in this encounter:  No orders of the defined types were placed in this encounter.    *If you need refills on other medications prior to your next appointment, please contact your pharmacy*  Follow-Up: Call back or seek an in-person evaluation if the symptoms worsen or if the condition fails to improve as anticipated.  Other Instructions Please go for in-person evaluation at nearest urgent care as discussed (see below). If you note any acute worsening of symptoms before evaluation, please seek an in-person evaluation.  If you have been instructed to have an in-person evaluation today at a local Urgent Care facility, please use the link below. It will take you to a list of all of our available [facility], including address, phone number and hours of operation. Please do not delay care.  [facility]  If you or a family member do not have a primary care provider, use the link below to schedule a visit and establish care. When you choose a [facility] primary care physician or advanced practice provider, you gain a long-term partner in health. Find a Primary Care Provider  Learn more about [facility]'s in-office and virtual care options: [facility] - Get Care Now

## 2022-01-02 ENCOUNTER — Emergency Department (HOSPITAL_COMMUNITY)
Admission: EM | Admit: 2022-01-02 | Discharge: 2022-01-02 | Discharge status: Against Medical Advice | Payer: Medicaid Other | Attending: Emergency Medicine | Admitting: Emergency Medicine

## 2022-01-02 ENCOUNTER — Other Ambulatory Visit: Payer: Self-pay

## 2022-01-02 ENCOUNTER — Emergency Department (HOSPITAL_COMMUNITY): Payer: Medicaid Other

## 2022-01-02 ENCOUNTER — Telehealth: Payer: Managed Care, Other (non HMO) | Admitting: Emergency Medicine

## 2022-01-02 ENCOUNTER — Encounter (HOSPITAL_COMMUNITY): Payer: Self-pay

## 2022-01-02 DIAGNOSIS — R112 Nausea with vomiting, unspecified: Secondary | ICD-10-CM | POA: Diagnosis present

## 2022-01-02 DIAGNOSIS — R531 Weakness: Secondary | ICD-10-CM | POA: Diagnosis not present

## 2022-01-02 DIAGNOSIS — R197 Diarrhea, unspecified: Secondary | ICD-10-CM | POA: Diagnosis not present

## 2022-01-02 DIAGNOSIS — K2900 Acute gastritis without bleeding: Secondary | ICD-10-CM

## 2022-01-02 DIAGNOSIS — Z5321 Procedure and treatment not carried out due to patient leaving prior to being seen by health care provider: Secondary | ICD-10-CM | POA: Insufficient documentation

## 2022-01-02 DIAGNOSIS — Z1152 Encounter for screening for COVID-19: Secondary | ICD-10-CM | POA: Diagnosis not present

## 2022-01-02 DIAGNOSIS — R519 Headache, unspecified: Secondary | ICD-10-CM | POA: Insufficient documentation

## 2022-01-02 DIAGNOSIS — R509 Fever, unspecified: Secondary | ICD-10-CM | POA: Insufficient documentation

## 2022-01-02 LAB — CBC WITH DIFFERENTIAL/PLATELET
Abs Immature Granulocytes: 0.02 10*3/uL (ref 0.00–0.07)
Basophils Absolute: 0 10*3/uL (ref 0.0–0.1)
Basophils Relative: 0 %
Eosinophils Absolute: 0.1 10*3/uL (ref 0.0–0.5)
Eosinophils Relative: 1 %
HCT: 31.9 % — ABNORMAL LOW (ref 36.0–46.0)
Hemoglobin: 10.7 g/dL — ABNORMAL LOW (ref 12.0–15.0)
Immature Granulocytes: 0 %
Lymphocytes Relative: 7 %
Lymphs Abs: 0.5 10*3/uL — ABNORMAL LOW (ref 0.7–4.0)
MCH: 30.1 pg (ref 26.0–34.0)
MCHC: 33.5 g/dL (ref 30.0–36.0)
MCV: 89.6 fL (ref 80.0–100.0)
Monocytes Absolute: 0.5 10*3/uL (ref 0.1–1.0)
Monocytes Relative: 7 %
Neutro Abs: 5.9 10*3/uL (ref 1.7–7.7)
Neutrophils Relative %: 85 %
Platelets: 129 10*3/uL — ABNORMAL LOW (ref 150–400)
RBC: 3.56 MIL/uL — ABNORMAL LOW (ref 3.87–5.11)
RDW: 15 % (ref 11.5–15.5)
WBC: 7 10*3/uL (ref 4.0–10.5)
nRBC: 0 % (ref 0.0–0.2)

## 2022-01-02 LAB — LIPASE, BLOOD: Lipase: 29 U/L (ref 11–51)

## 2022-01-02 LAB — COMPREHENSIVE METABOLIC PANEL
ALT: 13 U/L (ref 0–44)
AST: 16 U/L (ref 15–41)
Albumin: 4 g/dL (ref 3.5–5.0)
Alkaline Phosphatase: 33 U/L — ABNORMAL LOW (ref 38–126)
Anion gap: 14 (ref 5–15)
BUN: 9 mg/dL (ref 6–20)
CO2: 20 mmol/L — ABNORMAL LOW (ref 22–32)
Calcium: 9.3 mg/dL (ref 8.9–10.3)
Chloride: 105 mmol/L (ref 98–111)
Creatinine, Ser: 0.86 mg/dL (ref 0.44–1.00)
GFR, Estimated: >60 mL/min (ref >60)
Glucose, Bld: 84 mg/dL (ref 70–99)
Potassium: 3.1 mmol/L — ABNORMAL LOW (ref 3.5–5.1)
Sodium: 139 mmol/L (ref 135–145)
Total Bilirubin: 0.6 mg/dL (ref 0.3–1.2)
Total Protein: 6.6 g/dL (ref 6.5–8.1)

## 2022-01-02 LAB — I-STAT BETA HCG BLOOD, ED (MC, WL, AP ONLY): I-stat hCG, quantitative: <5 m[IU]/mL (ref <5)

## 2022-01-02 LAB — LACTIC ACID, PLASMA: Lactic Acid, Venous: 1.3 mmol/L (ref 0.5–1.9)

## 2022-01-02 LAB — RESP PANEL BY RT-PCR (FLU A&B, COVID) ARPGX2
Influenza A by PCR: NEGATIVE
Influenza B by PCR: NEGATIVE
SARS Coronavirus 2 by RT PCR: NEGATIVE

## 2022-01-02 MED ORDER — ACETAMINOPHEN 500 MG PO TABS
1000.0000 mg | ORAL_TABLET | Freq: Once | ORAL | Status: AC
  Administered 2022-01-02: 1000 mg via ORAL
  Filled 2022-01-02: qty 2

## 2022-01-02 MED ORDER — ONDANSETRON 4 MG PO TBDP
4.0000 mg | ORAL_TABLET | Freq: Once | ORAL | Status: AC
  Administered 2022-01-02: 4 mg via ORAL
  Filled 2022-01-02: qty 1

## 2022-01-02 MED ORDER — IOHEXOL 350 MG/ML SOLN
75.0000 mL | Freq: Once | INTRAVENOUS | Status: AC | PRN
  Administered 2022-01-02: 75 mL via INTRAVENOUS

## 2022-01-02 MED ORDER — SODIUM CHLORIDE 0.9 % IV BOLUS
1000.0000 mL | Freq: Once | INTRAVENOUS | Status: AC
  Administered 2022-01-02: 1000 mL via INTRAVENOUS

## 2022-01-02 MED ORDER — ONDANSETRON HCL 4 MG PO TABS: 4.0000 mg | ORAL_TABLET | Freq: Three times a day (TID) | ORAL | 0 refills | Status: DC | PRN

## 2022-01-02 NOTE — ED Provider Triage Note (Signed)
Emergency Medicine Provider Triage Evaluation Note  Lisa Holt , a 31 y.o. female  was evaluated in triage.  Pt complains of several days of nausea vomiting diarrhea.  She is febrile today.  She feels extremely weak.  No recent travel history, no known contacts with similar symptoms.  Review of Systems  Positive: Fever Negative: Urinary symptoms  Physical Exam  BP 93/79 (BP Location: Left Arm)   Pulse 94   Temp (!) 102.6 F (39.2 C) (Oral)   Resp (!) 22   Ht 5\' 3"  (1.6 m)   Wt 47.6 kg   SpO2 100%   BMI 18.60 kg/m  Gen:   Awake, no distress   Resp:  Normal effort  MSK:   Moves extremities without difficulty  Other:  Appears ill, no abd tenderness  Medical Decision Making  Medically screening exam initiated at 4:56 PM.  Appropriate orders placed.  Lisa Holt was informed that the remainder of the evaluation will be completed by another provider, this initial triage assessment does not replace that evaluation, and the importance of remaining in the ED until their evaluation is complete.  Work up initiated   [name], PA-C 01/02/22 1659

## 2022-01-02 NOTE — ED Triage Notes (Signed)
Pt arrives to ED POV CO N/V, Headache and weakness. Per Pt she had a headache last night and took a tylenol and woke up feeling worse today. Pt febrile in triage.

## 2022-01-02 NOTE — Progress Notes (Signed)
Virtual Visit Consent   Lisa Holt, you are scheduled for a virtual visit with a [facility] provider today. Just as with appointments in the office, your consent must be obtained to participate. Your consent will be active for this visit and any virtual visit you may have with one of our providers in the next 365 days. If you have a MyChart account, a copy of this consent can be sent to you electronically.  As this is a virtual visit, video technology does not allow for your provider to perform a traditional examination. This may limit your provider's ability to fully assess your condition. If your provider identifies any concerns that need to be evaluated in person or the need to arrange testing (such as labs, EKG, etc.), we will make arrangements to do so. Although advances in technology are sophisticated, we cannot ensure that it will always work on either your end or our end. If the connection with a video visit is poor, the visit may have to be switched to a telephone visit. With either a video or telephone visit, we are not always able to ensure that we have a secure connection.  By engaging in this virtual visit, you consent to the provision of healthcare and authorize for your insurance to be billed (if applicable) for the services provided during this visit. Depending on your insurance coverage, you may receive a charge related to this service.  I need to obtain your verbal consent now. Are you willing to proceed with your visit today? Lisa Holt has provided verbal consent on 01/02/2022 for a virtual visit (video or telephone). Cathlyn Parsons, NP  Date: 01/02/2022 3:35 PM  Virtual Visit via Video Note   I, Cathlyn Parsons, connected with  Lisa Holt  ([id number], 12-29-1990) on 01/02/22 at  3:15 PM EST by a video-enabled telemedicine application and verified that I am speaking with the correct person using two identifiers.  Location: Patient: Virtual Visit Location Patient:  Home Provider: Virtual Visit Location Provider: Home Office   I discussed the limitations of evaluation and management by telemedicine and the availability of in person appointments. The patient expressed understanding and agreed to proceed.    History of Present Illness: Lisa Holt is a 31 y.o. who identifies as a female who was assigned female at birth, and is being seen today for vomiting and fever. Woke up yesterday with a headache and then developed fever up to 101.61F and  vomiting. Has vomited 3-4 times. No diarrhea; last bowel movement was yesterday evening and was normal. Has not tried to drink anything but water but feels like it comes back up. Tested for covid at home and it was negative. Denies congestion or cough. Tylenol did not help headache. Denies abd pain except for cramping right before she vomits. Just finished menstrual cycle. Does not think she can be pregnant.   HPI: HPI  Problems:  Patient Active Problem List   Diagnosis Date Noted   Surveillance of previously prescribed implantable subdermal contraceptive 10/16/2019   Right ovarian cyst 09/18/2018   Chest pain 01/21/2018   Hypokalemia 01/21/2018   Hypomagnesemia 01/21/2018   Influenza due to influenza virus, type B 01/20/2018   HSIL (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 08/06/2015    Allergies:  Allergies  Allergen Reactions   Latex Itching and Swelling   Medications:  Current Outpatient Medications:    ondansetron (ZOFRAN) 4 MG tablet, Take 1 tablet (4 mg  total) by mouth every 8 (eight) hours as needed for nausea or vomiting., Disp: 20 tablet, Rfl: 0   albuterol (VENTOLIN HFA) 108 (90 Base) MCG/ACT inhaler, Inhale 2 puffs into the lungs every 4 (four) hours as needed for wheezing or shortness of breath., Disp: 1 each, Rfl: 1   cetirizine (ZYRTEC) 10 MG tablet, Take 10 mg by mouth daily., Disp: , Rfl:    diclofenac (VOLTAREN) 75 MG EC tablet, Take 1 tablet (75 mg total) by mouth  2 (two) times daily., Disp: 20 tablet, Rfl: 0   dicyclomine (BENTYL) 10 MG capsule, Take 10 mg by mouth 4 (four) times daily -  before meals and at bedtime., Disp: , Rfl:    hydrOXYzine (ATARAX/VISTARIL) 25 MG tablet, Take 25 mg by mouth 2 (two) times daily as needed., Disp: , Rfl:    ibuprofen (ADVIL) 600 MG tablet, Take 1 tablet (600 mg total) by mouth every 6 (six) hours as needed., Disp: 30 tablet, Rfl: 0   medroxyPROGESTERone (DEPO-PROVERA) 150 MG/ML injection, Inject 150 mg into the muscle every 3 (three) months., Disp: , Rfl:    meloxicam (MOBIC) 7.5 MG tablet, Take 7.5 mg by mouth daily., Disp: , Rfl:    Multiple Vitamin (MULTIVITAMIN WITH MINERALS) TABS tablet, Take 1 tablet by mouth daily., Disp: , Rfl:    omeprazole (PRILOSEC) 20 MG capsule, Take 20 mg by mouth daily., Disp: , Rfl:    Vitamin D, Ergocalciferol, (DRISDOL) 1.25 MG (50000 UNIT) CAPS capsule, Take 50,000 Units by mouth every 7 (seven) days., Disp: , Rfl:   Observations/Objective: Patient is well-developed, well-nourished in no acute distress.  Resting at home, lying down, appears as if she does not feel well Head is normocephalic, atraumatic.  No labored breathing.  Speech is clear and coherent with logical content.  Patient is alert and oriented at baseline.    Assessment and Plan: 1. Acute gastritis without hemorrhage, unspecified gastritis type  Reviewed supportive care measures and reasons for seeking in person care.   Follow Up Instructions: I discussed the assessment and treatment plan with the patient. The patient was provided an opportunity to ask questions and all were answered. The patient agreed with the plan and demonstrated an understanding of the instructions.  A copy of instructions were sent to the patient via MyChart unless otherwise noted below.   The patient was advised to call back or seek an in-person evaluation if the symptoms worsen or if the condition fails to improve as anticipated.  Time:   I spent 10 minutes with the patient via telehealth technology discussing the above problems/concerns.    Cathlyn Parsons, NP

## 2022-01-02 NOTE — Patient Instructions (Signed)
[name], thank you for joining Cathlyn Parsons, NP for today's virtual visit.  While this provider is not your primary care provider (PCP), if your PCP is located in our provider database this encounter information will be shared with them immediately following your visit.   A [facility] MyChart account gives you access to today's visit and all your visits, tests, and labs performed at [facility] " click here if you don't have a [facility] MyChart account or go to [email or web address]  Consent: (Patient) Lisa Holt provided verbal consent for this virtual visit at the beginning of the encounter.  Current Medications:  Current Outpatient Medications:    ondansetron (ZOFRAN) 4 MG tablet, Take 1 tablet (4 mg total) by mouth every 8 (eight) hours as needed for nausea or vomiting., Disp: 20 tablet, Rfl: 0   albuterol (VENTOLIN HFA) 108 (90 Base) MCG/ACT inhaler, Inhale 2 puffs into the lungs every 4 (four) hours as needed for wheezing or shortness of breath., Disp: 1 each, Rfl: 1   cetirizine (ZYRTEC) 10 MG tablet, Take 10 mg by mouth daily., Disp: , Rfl:    diclofenac (VOLTAREN) 75 MG EC tablet, Take 1 tablet (75 mg total) by mouth 2 (two) times daily., Disp: 20 tablet, Rfl: 0   dicyclomine (BENTYL) 10 MG capsule, Take 10 mg by mouth 4 (four) times daily -  before meals and at bedtime., Disp: , Rfl:    hydrOXYzine (ATARAX/VISTARIL) 25 MG tablet, Take 25 mg by mouth 2 (two) times daily as needed., Disp: , Rfl:    ibuprofen (ADVIL) 600 MG tablet, Take 1 tablet (600 mg total) by mouth every 6 (six) hours as needed., Disp: 30 tablet, Rfl: 0   medroxyPROGESTERone (DEPO-PROVERA) 150 MG/ML injection, Inject 150 mg into the muscle every 3 (three) months., Disp: , Rfl:    meloxicam (MOBIC) 7.5 MG tablet, Take 7.5 mg by mouth daily., Disp: , Rfl:    Multiple Vitamin (MULTIVITAMIN WITH MINERALS) TABS tablet, Take 1 tablet by mouth daily., Disp: , Rfl:    omeprazole (PRILOSEC) 20  MG capsule, Take 20 mg by mouth daily., Disp: , Rfl:    Vitamin D, Ergocalciferol, (DRISDOL) 1.25 MG (50000 UNIT) CAPS capsule, Take 50,000 Units by mouth every 7 (seven) days., Disp: , Rfl:    Medications ordered in this encounter:  Meds ordered this encounter  Medications   ondansetron (ZOFRAN) 4 MG tablet    Sig: Take 1 tablet (4 mg total) by mouth every 8 (eight) hours as needed for nausea or vomiting.    Dispense:  20 tablet    Refill:  0     *If you need refills on other medications prior to your next appointment, please contact your pharmacy*  Follow-Up: Call back or seek an in-person evaluation if the symptoms worsen or if the condition fails to improve as anticipated.  [facility] [phone]    If you have been instructed to have an in-person evaluation today at a local Urgent Care facility, please use the link below. It will take you to a list of all of our available [facility], including address, phone number and hours of operation. Please do not delay care.  [facility]  If you or a family member do not have a primary care provider, use the link below to schedule a visit and establish care. When you choose a [facility] primary care physician or advanced practice provider, you gain a long-term  partner in health. Find a Primary Care Provider  Learn more about [facility] in-office and virtual care options: [facility]

## 2022-01-02 NOTE — ED Notes (Signed)
Patient left on own accord °

## 2022-01-07 LAB — CULTURE, BLOOD (ROUTINE X 2)
Culture: NO GROWTH
Culture: NO GROWTH
Special Requests: ADEQUATE

## 2022-01-08 NOTE — Telephone Encounter (Signed)
No contact on this date of 01/08/2022.

## 2022-02-02 ENCOUNTER — Other Ambulatory Visit: Payer: Self-pay

## 2022-02-02 ENCOUNTER — Emergency Department (HOSPITAL_COMMUNITY)
Admission: EM | Admit: 2022-02-02 | Discharge: 2022-02-02 | Disposition: A | Discharge status: Home / Self Care | Payer: Commercial Managed Care - HMO | Attending: Emergency Medicine | Admitting: Emergency Medicine

## 2022-02-02 ENCOUNTER — Encounter (HOSPITAL_COMMUNITY): Payer: Self-pay

## 2022-02-02 DIAGNOSIS — R197 Diarrhea, unspecified: Secondary | ICD-10-CM | POA: Insufficient documentation

## 2022-02-02 DIAGNOSIS — Z91040 Latex allergy status: Secondary | ICD-10-CM | POA: Insufficient documentation

## 2022-02-02 DIAGNOSIS — Z1152 Encounter for screening for COVID-19: Secondary | ICD-10-CM | POA: Diagnosis not present

## 2022-02-02 DIAGNOSIS — R109 Unspecified abdominal pain: Secondary | ICD-10-CM | POA: Insufficient documentation

## 2022-02-02 DIAGNOSIS — R112 Nausea with vomiting, unspecified: Secondary | ICD-10-CM

## 2022-02-02 LAB — URINALYSIS, ROUTINE W REFLEX MICROSCOPIC
Bilirubin Urine: NEGATIVE
Glucose, UA: NEGATIVE mg/dL
Ketones, ur: 20 mg/dL — AB
Nitrite: NEGATIVE
Protein, ur: 30 mg/dL — AB
Specific Gravity, Urine: 1.035 — ABNORMAL HIGH (ref 1.005–1.030)
pH: 5 (ref 5.0–8.0)

## 2022-02-02 LAB — CBC
HCT: 40.3 % (ref 36.0–46.0)
Hemoglobin: 13.2 g/dL (ref 12.0–15.0)
MCH: 29.1 pg (ref 26.0–34.0)
MCHC: 32.8 g/dL (ref 30.0–36.0)
MCV: 89 fL (ref 80.0–100.0)
Platelets: 135 10*3/uL — ABNORMAL LOW (ref 150–400)
RBC: 4.53 MIL/uL (ref 3.87–5.11)
RDW: 14.4 % (ref 11.5–15.5)
WBC: 5 10*3/uL (ref 4.0–10.5)
nRBC: 0 % (ref 0.0–0.2)

## 2022-02-02 LAB — COMPREHENSIVE METABOLIC PANEL
ALT: 17 U/L (ref 0–44)
AST: 18 U/L (ref 15–41)
Albumin: 4.3 g/dL (ref 3.5–5.0)
Alkaline Phosphatase: 38 U/L (ref 38–126)
Anion gap: 8 (ref 5–15)
BUN: 13 mg/dL (ref 6–20)
CO2: 25 mmol/L (ref 22–32)
Calcium: 8.9 mg/dL (ref 8.9–10.3)
Chloride: 105 mmol/L (ref 98–111)
Creatinine, Ser: 0.88 mg/dL (ref 0.44–1.00)
GFR, Estimated: >60 mL/min (ref >60)
Glucose, Bld: 91 mg/dL (ref 70–99)
Potassium: 3.4 mmol/L — ABNORMAL LOW (ref 3.5–5.1)
Sodium: 138 mmol/L (ref 135–145)
Total Bilirubin: 0.8 mg/dL (ref 0.3–1.2)
Total Protein: 6.9 g/dL (ref 6.5–8.1)

## 2022-02-02 LAB — I-STAT BETA HCG BLOOD, ED (MC, WL, AP ONLY): I-stat hCG, quantitative: <5 m[IU]/mL (ref <5)

## 2022-02-02 LAB — RESP PANEL BY RT-PCR (RSV, FLU A&B, COVID)  RVPGX2
Influenza A by PCR: NEGATIVE
Influenza B by PCR: NEGATIVE
Resp Syncytial Virus by PCR: NEGATIVE
SARS Coronavirus 2 by RT PCR: NEGATIVE

## 2022-02-02 LAB — LIPASE, BLOOD: Lipase: 30 U/L (ref 11–51)

## 2022-02-02 MED ORDER — ONDANSETRON HCL 4 MG/2ML IJ SOLN
4.0000 mg | Freq: Once | INTRAMUSCULAR | Status: AC
  Administered 2022-02-02: 4 mg via INTRAVENOUS
  Filled 2022-02-02: qty 2

## 2022-02-02 MED ORDER — SODIUM CHLORIDE 0.9 % IV BOLUS
1000.0000 mL | Freq: Once | INTRAVENOUS | Status: AC
  Administered 2022-02-02: 1000 mL via INTRAVENOUS

## 2022-02-02 MED ORDER — ONDANSETRON 4 MG PO TBDP: 4.0000 mg | ORAL_TABLET | Freq: Three times a day (TID) | ORAL | 0 refills | Status: DC | PRN

## 2022-02-02 MED ORDER — ACETAMINOPHEN 325 MG PO TABS
650.0000 mg | ORAL_TABLET | Freq: Once | ORAL | Status: AC
  Administered 2022-02-02: 650 mg via ORAL
  Filled 2022-02-02: qty 2

## 2022-02-02 MED ORDER — KETOROLAC TROMETHAMINE 30 MG/ML IJ SOLN
30.0000 mg | Freq: Once | INTRAMUSCULAR | Status: AC
  Administered 2022-02-02: 30 mg via INTRAVENOUS
  Filled 2022-02-02: qty 1

## 2022-02-02 MED ORDER — DICYCLOMINE HCL 20 MG PO TABS: 20.0000 mg | ORAL_TABLET | Freq: Two times a day (BID) | ORAL | 0 refills | Status: DC

## 2022-02-02 NOTE — Discharge Instructions (Addendum)
Overall suspect you have a viral process as discussed.  However if you develop worsening symptoms especially increasing pain especially in the right lower quadrant please return for evaluation.  At this time I do not think we need to do any imaging of your abdomen pelvis but if pain continues to get worse and more focal please consider returning for evaluation.

## 2022-02-02 NOTE — ED Provider Notes (Signed)
[facility]-EMERGENCY DEPT Provider Note   CSN: [id number] Arrival date & time: 02/02/22  0856     History  Chief Complaint  Patient presents with   Emesis    Lisa Holt is a 31 y.o. female.  Patient here with nausea vomiting diarrhea for the last day or 2.  Her child had similar symptoms over the weekend.  No suspicious food intake.  Having some abdominal cramping.  Denies any chest pain, shortness of breath, pain with urination.  Nothing makes it worse or better.  Denies any major medical problems except for some acid reflux.  No recent illnesses otherwise.  The history is provided by the patient.       Home Medications Prior to Admission medications   Medication Sig Start Date End Date Taking? Authorizing Provider  dicyclomine (BENTYL) 20 MG tablet Take 1 tablet (20 mg total) by mouth 2 (two) times daily. 02/02/22  Yes Nataley Bahri, DO  ondansetron (ZOFRAN-ODT) 4 MG disintegrating tablet Take 1 tablet (4 mg total) by mouth every 8 (eight) hours as needed for nausea or vomiting. 02/02/22  Yes Marie Chow, DO  albuterol (VENTOLIN HFA) 108 (90 Base) MCG/ACT inhaler Inhale 2 puffs into the lungs every 4 (four) hours as needed for wheezing or shortness of breath. 01/12/21   Cathren Laine, MD  cetirizine (ZYRTEC) 10 MG tablet Take 10 mg by mouth daily.    [provider]  diclofenac (VOLTAREN) 75 MG EC tablet Take 1 tablet (75 mg total) by mouth 2 (two) times daily. 06/23/20   Waldon Merl, PA-C  hydrOXYzine (ATARAX/VISTARIL) 25 MG tablet Take 25 mg by mouth 2 (two) times daily as needed.    [provider]  ibuprofen (ADVIL) 600 MG tablet Take 1 tablet (600 mg total) by mouth every 6 (six) hours as needed. 09/12/21   Fayrene Helper, PA-C  medroxyPROGESTERone (DEPO-PROVERA) 150 MG/ML injection Inject 150 mg into the muscle every 3 (three) months.    [provider]  meloxicam (MOBIC) 7.5 MG tablet Take 7.5 mg by mouth daily.     [provider]  Multiple Vitamin (MULTIVITAMIN WITH MINERALS) TABS tablet Take 1 tablet by mouth daily.    [provider]  omeprazole (PRILOSEC) 20 MG capsule Take 20 mg by mouth daily.    [provider]  ondansetron (ZOFRAN) 4 MG tablet Take 1 tablet (4 mg total) by mouth every 8 (eight) hours as needed for nausea or vomiting. 01/02/22   Cathlyn Parsons, NP  Vitamin D, Ergocalciferol, (DRISDOL) 1.25 MG (50000 UNIT) CAPS capsule Take 50,000 Units by mouth every 7 (seven) days.    [provider]      Allergies    Latex    Review of Systems   Review of Systems  Physical Exam Updated Vital Signs BP 100/75   Pulse 98   Temp (!) 100.6 F (38.1 C) (Oral)   Resp 17   SpO2 96%  Physical Exam Vitals and nursing note reviewed.  Constitutional:      General: She is not in acute distress.    Appearance: She is well-developed. She is not ill-appearing.  HENT:     Head: Normocephalic and atraumatic.     Nose: Nose normal.     Mouth/Throat:     Mouth: Mucous membranes are moist.  Eyes:     Extraocular Movements: Extraocular movements intact.     Conjunctiva/sclera: Conjunctivae normal.     Pupils: Pupils are equal, round, and  reactive to light.  Cardiovascular:     Rate and Rhythm: Normal rate and regular rhythm.     Pulses: Normal pulses.     Heart sounds: Normal heart sounds. No murmur heard. Pulmonary:     Effort: Pulmonary effort is normal. No respiratory distress.     Breath sounds: Normal breath sounds.  Abdominal:     Palpations: Abdomen is soft.     Tenderness: There is no abdominal tenderness.  Musculoskeletal:        General: No swelling.     Cervical back: Normal range of motion and neck supple.  Skin:    General: Skin is warm and dry.     Capillary Refill: Capillary refill takes less than 2 seconds.  Neurological:     General: No focal deficit present.     Mental Status: She is alert.  Psychiatric:        Mood and Affect:  Mood normal.     ED Results / Procedures / Treatments   Labs (all labs ordered are listed, but only abnormal results are displayed) Labs Reviewed  COMPREHENSIVE METABOLIC PANEL - Abnormal; Notable for the following components:      Result Value   Potassium 3.4 (*)    All other components within normal limits  CBC - Abnormal; Notable for the following components:   Platelets 135 (*)    All other components within normal limits  URINALYSIS, ROUTINE W REFLEX MICROSCOPIC - Abnormal; Notable for the following components:   APPearance HAZY (*)    Specific Gravity, Urine 1.035 (*)    Hgb urine dipstick MODERATE (*)    Ketones, ur 20 (*)    Protein, ur 30 (*)    Leukocytes,Ua MODERATE (*)    Bacteria, UA RARE (*)    All other components within normal limits  RESP PANEL BY RT-PCR (RSV, FLU A&B, COVID)  RVPGX2  LIPASE, BLOOD  I-STAT BETA HCG BLOOD, ED ([facility], [facility], [facility] ONLY)    EKG None  Radiology No results found.  Procedures Procedures    Medications Ordered in ED Medications  sodium chloride 0.9 % bolus 1,000 mL (1,000 mLs Intravenous New Bag/Given 02/02/22 1120)  ondansetron (ZOFRAN) injection 4 mg (4 mg Intravenous Given 02/02/22 1119)  acetaminophen (TYLENOL) tablet 650 mg (650 mg Oral Given 02/02/22 1120)  ketorolac (TORADOL) 30 MG/ML injection 30 mg (30 mg Intravenous Given 02/02/22 1204)    ED Course/ Medical Decision Making/ A&P                           Medical Decision Making Amount and/or Complexity of Data Reviewed Labs: ordered.  Risk OTC drugs. Prescription drug management.   Lisa Holt is here with nausea, vomiting, diarrhea for the last day or 2.  Family member with similar symptoms over the weekend.  Patient with normal vitals.  No fever.  Some cramping abdominal pain associated with nausea vomiting diarrhea.  No suspicious food intake.  She has had chills.  Differential diagnosis likely viral process as family member with similar symptoms this  weekend.  Could be foodborne related or viral related.  I have much lower suspicion for acute intra-abdominal process otherwise.  No concern for appendicitis or other intra-abdominal infection at this time.  Will get CBC, CMP, lipase, urinalysis, viral testing.  Will give IV fluids, IV Zofran, fentanyl and reevaluate.  Per my review and interpretation of labs patient with no significant anemia, electrolyte abnormality, kidney injury  or leukocytosis.  COVID and flu test negative.  Pregnancy test negative.  Overall suspect viral gastroenteritis.  She does not have any focal abdominal tenderness on exam.  At this time I have low suspicion for appendicitis or other acute intra-abdominal process, especially with sick contact with the same symptoms recently.  Recommend continued use of Tylenol and ibuprofen at home.  Will prescribe Zofran and Bentyl.  Understands return precautions.  This chart was dictated using voice recognition software.  Despite best efforts to proofread,  errors can occur which can change the documentation meaning.         Final Clinical Impression(s) / ED Diagnoses Final diagnoses:  Nausea vomiting and diarrhea    Rx / DC Orders ED Discharge Orders          Ordered    ondansetron (ZOFRAN-ODT) 4 MG disintegrating tablet  Every 8 hours PRN        02/02/22 1217    dicyclomine (BENTYL) 20 MG tablet  2 times daily        02/02/22 1217              Virgina Norfolk, DO 02/02/22 1219

## 2022-02-02 NOTE — ED Triage Notes (Signed)
Vomiting and diarrhea that started yesterday. Pt states she has been having fevers as well.

## 2022-02-25 IMAGING — CR DG CHEST 2V
2 series · 2 of 2 positions shown · non-contrast
Comparison: 01/20/2018

CLINICAL DATA: Cough and chest pain

EXAM:
CHEST - 2 VIEW

[chest lat]
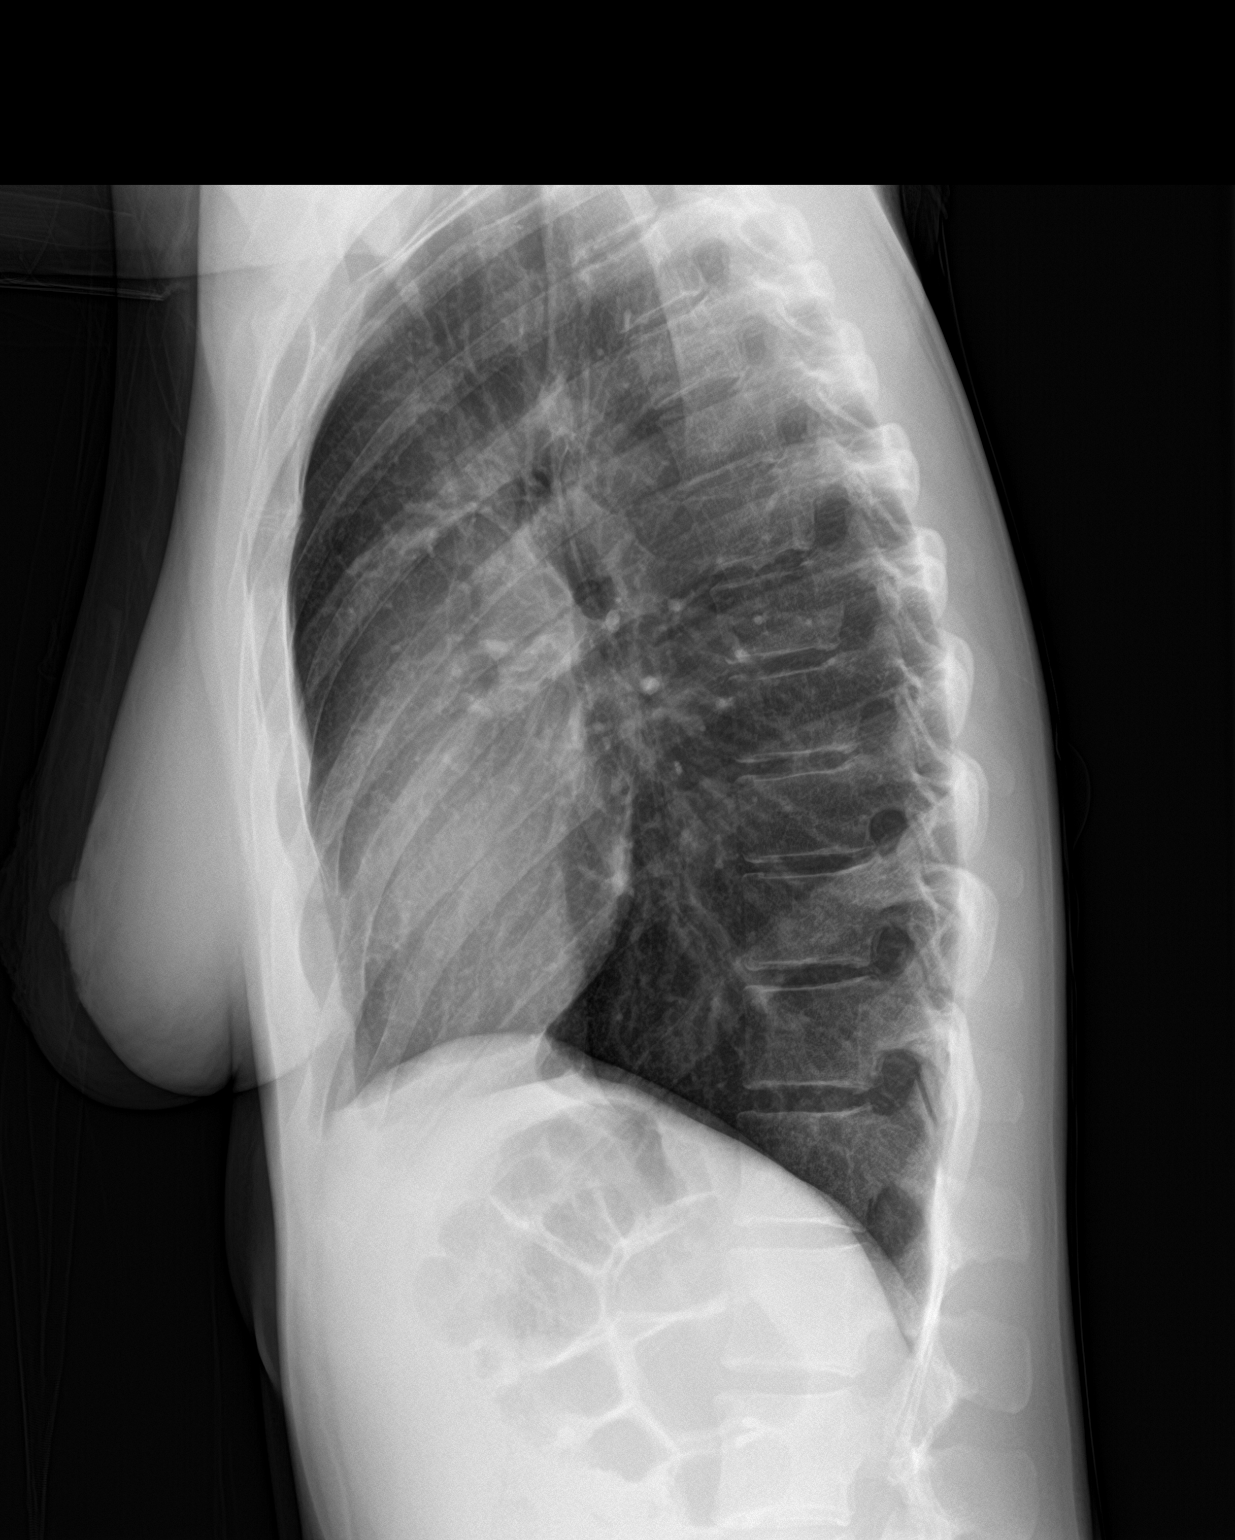

[chest ap]
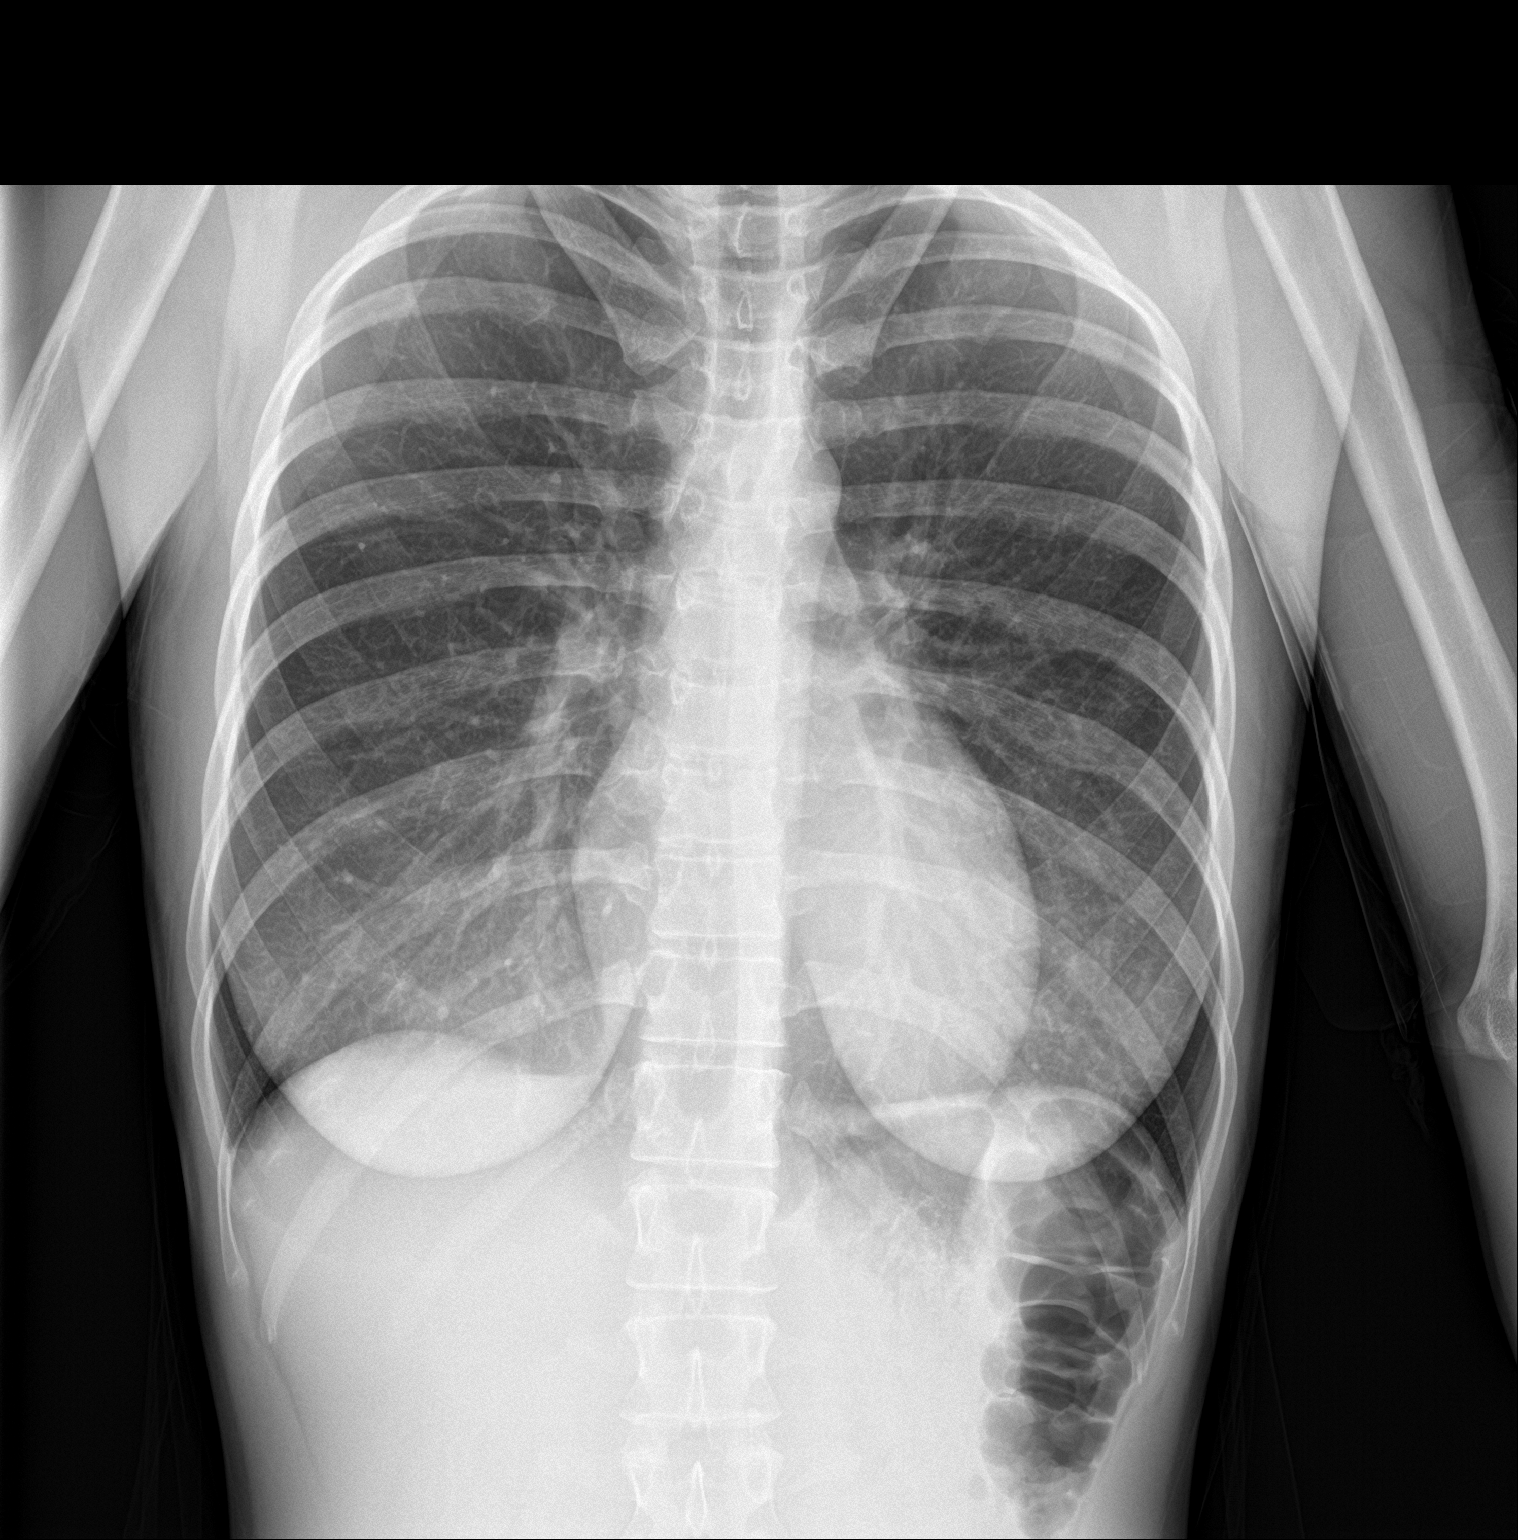

[2 of 2 positions shown; findings below may reference images not displayed]

FINDINGS: The heart size and mediastinal contours are within normal limits.
Both lungs are clear. The visualized skeletal structures are
unremarkable.
IMPRESSION: No acute abnormality of the lungs.

## 2022-03-10 ENCOUNTER — Ambulatory Visit (HOSPITAL_COMMUNITY)
Admission: EM | Admit: 2022-03-10 | Discharge: 2022-03-10 | Disposition: A | Discharge status: Home / Self Care | Payer: Commercial Managed Care - HMO | Attending: Emergency Medicine | Admitting: Emergency Medicine

## 2022-03-10 ENCOUNTER — Other Ambulatory Visit: Payer: Self-pay

## 2022-03-10 ENCOUNTER — Encounter (HOSPITAL_COMMUNITY): Payer: Self-pay | Admitting: Emergency Medicine

## 2022-03-10 DIAGNOSIS — J069 Acute upper respiratory infection, unspecified: Secondary | ICD-10-CM | POA: Insufficient documentation

## 2022-03-10 DIAGNOSIS — F419 Anxiety disorder, unspecified: Secondary | ICD-10-CM

## 2022-03-10 DIAGNOSIS — R102 Pelvic and perineal pain: Secondary | ICD-10-CM

## 2022-03-10 LAB — POCT URINALYSIS DIPSTICK, ED / UC
Bilirubin Urine: NEGATIVE
Glucose, UA: NEGATIVE mg/dL
Hgb urine dipstick: NEGATIVE
Ketones, ur: NEGATIVE mg/dL
Nitrite: NEGATIVE
Protein, ur: NEGATIVE mg/dL
Specific Gravity, Urine: 1.025 (ref 1.005–1.030)
Urobilinogen, UA: 0.2 mg/dL (ref 0.0–1.0)
pH: 5 (ref 5.0–8.0)

## 2022-03-10 LAB — POC URINE PREG, ED: Preg Test, Ur: NEGATIVE

## 2022-03-10 MED ORDER — CETIRIZINE HCL 10 MG PO TABS: 10.0000 mg | ORAL_TABLET | Freq: Every day | ORAL | 2 refills | Status: DC

## 2022-03-10 MED ORDER — FLUTICASONE PROPIONATE 50 MCG/ACT NA SUSP: 2.0000 | Freq: Every day | NASAL | 2 refills | Status: DC

## 2022-03-10 MED ORDER — HYDROXYZINE HCL 25 MG PO TABS: 25.0000 mg | ORAL_TABLET | Freq: Two times a day (BID) | ORAL | 0 refills | Status: DC | PRN

## 2022-03-10 NOTE — ED Triage Notes (Signed)
Complains of abdominal pain, pressure last night prior to urinating.  This morning felt pain with urination.  No back pain.  Lower abdomen has a pressure feeling and feels bloated.    Patient has cold symptoms, shares the same issues as 2 minor children.

## 2022-03-10 NOTE — Discharge Instructions (Addendum)
Urine without signs of infection. Your symptoms may be gynecological. Please call the ob/gyn clinic to set up an appointment for further evaluation. In the meantime, use tylenol or ibuprofen for pain. Drink lots of fluids.  For cough and congestion, I recommend daily nasal spray and robitussin cough syrup.  I have refilled your anxiety medication, take twice daily as needed. I recommend follow up with the behavioral health center for further evaluation and management.

## 2022-03-10 NOTE — ED Provider Notes (Signed)
[facility]    CSN: [id number] Arrival date & time: 03/10/22  1340     History   Chief Complaint Chief Complaint  Patient presents with   Abdominal Pain    HPI Lisa Holt is a 32 y.o. female.  Presents with multiple concerns, most worrisome is the abdominal pain Last night had pressure with urination. This morning had "stabbing pain" suprapubic with urination. Denies burning sensation or dysuria.  She feels bloated She denies back or flank pain, fever, vomiting, diarrhea/constipation  Denies known exposure to STD.  No vaginal discharge LMP unknown, irregular cycles Reports history of ovarian cysts Does not have ob/gyn  Additionally has congestion and cough Some ear pressure from congestion  No fever, sore throat, rash Two children sick with similar No medications tried  Reports history of anxiety Takes atarax. Requesting refill Reports anxiety is contributing to other symptoms   Past Medical History:  Diagnosis Date   Allergic rhinitis    Anemia    Anorexia    Anxiety    GERD (gastroesophageal reflux disease)    Ovarian cyst    Post partum depression    was prescribed meds, took 2, then stopped   Thyroglossal cyst    Vitamin D deficiency     Patient Active Problem List   Diagnosis Date Noted   Surveillance of previously prescribed implantable subdermal contraceptive 10/16/2019   Right ovarian cyst 09/18/2018   Chest pain 01/21/2018   Hypokalemia 01/21/2018   Hypomagnesemia 01/21/2018   Influenza due to influenza virus, type B 01/20/2018   HSIL (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 08/06/2015    Past Surgical History:  Procedure Laterality Date   WISDOM TOOTH EXTRACTION      OB History     Gravida  2   Para  2   Term  2   Preterm      AB      Living  2      SAB      IAB      Ectopic      Multiple  0   Live Births  2            Home Medications    Prior to Admission  medications   Medication Sig Start Date End Date Taking? Authorizing Provider  fluticasone (FLONASE) 50 MCG/ACT nasal spray Place 2 sprays into both nostrils daily. 03/10/22  Yes Keyvon Herter, Lurena Joiner, PA-C  dicyclomine (BENTYL) 20 MG tablet Take 1 tablet (20 mg total) by mouth 2 (two) times daily. 02/02/22   Curatolo, Adam, DO  hydrOXYzine (ATARAX) 25 MG tablet Take 1 tablet (25 mg total) by mouth 2 (two) times daily as needed. 03/10/22   Nochum Fenter, PA-C  Vitamin D, Ergocalciferol, (DRISDOL) 1.25 MG (50000 UNIT) CAPS capsule Take 50,000 Units by mouth every 7 (seven) days.    [provider]    Family History Family History  Problem Relation Age of Onset   Carpal tunnel syndrome Mother    Thyroid nodules Mother    Hypertension Father    Diabetes Father    Stroke Father     Social History Social History   Tobacco Use   Smoking status: Every Day    Packs/day: 1.00    Types: Cigarettes    Last attempt to quit: 06/05/2013    Years since quitting: 8.7   Smokeless tobacco: Never  Vaping Use   Vaping Use: Never used  Substance Use Topics  Alcohol use: Yes    Comment: occ   Drug use: No    Types: Marijuana, Cocaine    Comment: las Cocaine use Nov. 2016, last marijuana 02/22/15     Allergies   Latex   Review of Systems Review of Systems As per HPI  Physical Exam Triage Vital Signs ED Triage Vitals  Enc Vitals Group     BP 03/10/22 1518 109/73     Pulse Rate 03/10/22 1518 83     Resp 03/10/22 1518 18     Temp 03/10/22 1518 98.7 F (37.1 C)     Temp Source 03/10/22 1518 Oral     SpO2 03/10/22 1518 96 %     Weight --      Height --      Head Circumference --      Peak Flow --      Pain Score 03/10/22 1515 10     Pain Loc --      Pain Edu? --      Excl. in [place]? --    No data found.  Updated Vital Signs BP 109/73 (BP Location: Right Arm)   Pulse 83   Temp 98.7 F (37.1 C) (Oral)   Resp 18   SpO2 96%   Physical Exam Vitals and nursing note reviewed.   Constitutional:      Appearance: Normal appearance.  HENT:     Right Ear: Tympanic membrane and ear canal normal.     Left Ear: Tympanic membrane and ear canal normal.     Nose: Congestion present.     Mouth/Throat:     Mouth: Mucous membranes are moist.     Pharynx: Oropharynx is clear.  Eyes:     Conjunctiva/sclera: Conjunctivae normal.  Cardiovascular:     Rate and Rhythm: Normal rate and regular rhythm.     Pulses: Normal pulses.     Heart sounds: Normal heart sounds.  Pulmonary:     Effort: Pulmonary effort is normal. No respiratory distress.     Breath sounds: Normal breath sounds.  Abdominal:     General: Abdomen is flat. Bowel sounds are normal. There is no distension.     Palpations: Abdomen is soft. There is no mass.     Tenderness: There is abdominal tenderness in the suprapubic area. There is no right CVA tenderness, left CVA tenderness, guarding or rebound. Negative signs include Murphy's sign and Rovsing's sign.     Comments: Suprapubic area tender, no guarding or rebound. No mass or hernia felt in abd.  Musculoskeletal:        General: Normal range of motion.  Skin:    General: Skin is warm and dry.  Neurological:     Mental Status: She is alert and oriented to person, place, and time.     UC Treatments / Results  Labs (all labs ordered are listed, but only abnormal results are displayed) Labs Reviewed  POCT URINALYSIS DIPSTICK, ED / UC - Abnormal; Notable for the following components:      Result Value   Leukocytes,Ua TRACE (*)    All other components within normal limits  URINE CULTURE  POC URINE PREG, ED    EKG  Radiology No results found.  Procedures Procedures   Medications Ordered in UC Medications - No data to display  Initial Impression / Assessment and Plan / UC Course  I have reviewed the triage vital signs and the nursing notes.  Pertinent labs & imaging results that were available during  my care of the patient were reviewed by me  and considered in my medical decision making (see chart for details).  Afebrile here, stable vitals  UPT negative UA trace leuks. Culture pending, but not likely UTI/cause of symptoms Leaning towards gyn cause, especially with hx of cysts. No red flags or concern for acute abd at this t\ime. Recommend close follow-up with OB/GYN.  Provided women's clinic info, patient will call tomorrow morning to set up an appointment.  In the meantime can try Tylenol, hot pad, lots of fluids. ED precautions for any worsening symptoms.  For viral symptoms I recommend nasal spray, robitussin, fluids, honey. May take several days to clear but continue symptomatic care. No indication for imaging or abx at this time.  Refilled hydroxyzine. Recommend close follow up with PCP or behavioral health, provided [facility] information. Return precautions discussed. Patient agrees to plan   E/M level 4, moderate number and complexity of problems addressed, time spent  Final Clinical Impressions(s) / UC Diagnoses   Final diagnoses:  Suprapubic pain  Viral URI  Anxiety     Discharge Instructions      Urine without signs of infection. Your symptoms may be gynecological. Please call the ob/gyn clinic to set up an appointment for further evaluation. In the meantime, use tylenol or ibuprofen for pain. Drink lots of fluids.  For cough and congestion, I recommend daily nasal spray and robitussin cough syrup.  I have refilled your anxiety medication, take twice daily as needed. I recommend follow up with the behavioral health center for further evaluation and management.      ED Prescriptions     Medication Sig Dispense Auth. Provider   hydrOXYzine (ATARAX) 25 MG tablet Take 1 tablet (25 mg total) by mouth 2 (two) times daily as needed. 30 tablet Ethanjames Fontenot, PA-C   cetirizine (ZYRTEC ALLERGY) 10 MG tablet  (Status: Discontinued) Take 1 tablet (10 mg total) by mouth daily. 30 tablet Jozlynn Plaia, PA-C    fluticasone (FLONASE) 50 MCG/ACT nasal spray Place 2 sprays into both nostrils daily. 9.9 mL Chelsee Hosie, Wells Guiles, PA-C      PDMP not reviewed this encounter.   Lianah Peed, Wells Guiles, [place] 03/10/22 1711

## 2022-03-11 LAB — URINE CULTURE: Culture: NO GROWTH

## 2022-04-05 ENCOUNTER — Other Ambulatory Visit (HOSPITAL_COMMUNITY)
Admission: RE | Admit: 2022-04-05 | Discharge: 2022-04-05 | Disposition: A | Discharge status: Home / Self Care | Payer: Medicaid Other | Source: Ambulatory Visit | Attending: Obstetrics and Gynecology | Admitting: Obstetrics and Gynecology

## 2022-04-05 ENCOUNTER — Encounter: Payer: Self-pay | Admitting: Obstetrics and Gynecology

## 2022-04-05 ENCOUNTER — Ambulatory Visit (INDEPENDENT_AMBULATORY_CARE_PROVIDER_SITE_OTHER): Payer: Medicaid Other | Admitting: Obstetrics and Gynecology

## 2022-04-05 VITALS — BP 100/62 | HR 116 | Ht 63.0 in | Wt 102.0 lb

## 2022-04-05 DIAGNOSIS — Z3009 Encounter for other general counseling and advice on contraception: Secondary | ICD-10-CM | POA: Diagnosis not present

## 2022-04-05 DIAGNOSIS — Z01419 Encounter for gynecological examination (general) (routine) without abnormal findings: Secondary | ICD-10-CM | POA: Diagnosis not present

## 2022-04-05 DIAGNOSIS — R102 Pelvic and perineal pain: Secondary | ICD-10-CM

## 2022-04-05 DIAGNOSIS — Z1151 Encounter for screening for human papillomavirus (HPV): Secondary | ICD-10-CM

## 2022-04-05 DIAGNOSIS — Z124 Encounter for screening for malignant neoplasm of cervix: Secondary | ICD-10-CM | POA: Diagnosis present

## 2022-04-05 DIAGNOSIS — N939 Abnormal uterine and vaginal bleeding, unspecified: Secondary | ICD-10-CM | POA: Diagnosis not present

## 2022-04-05 DIAGNOSIS — Z113 Encounter for screening for infections with a predominantly sexual mode of transmission: Secondary | ICD-10-CM | POA: Diagnosis not present

## 2022-04-05 MED ORDER — NAPROXEN SODIUM 550 MG PO TABS: 550.0000 mg | ORAL_TABLET | Freq: Two times a day (BID) | ORAL | 1 refills | Status: DC

## 2022-04-05 NOTE — Progress Notes (Signed)
32 y.o. [id number] Single   Black or African American Other or two or more races Unavailable Not Hispanic or Latino female here for abnormal bleeding with nexplanon. She states that she has had her nexplanon since 2017 She states that she doesn't have an appetite.   Reports not being sexually active in 2 years. Negative UPT on 03/10/22.  She bleeds every 2 weeks to 2 months, can bleed for 7-14 days. It has that way since she had her son in 2017 (thinks the nexplanon was inserted around 9/17).  Period Duration (Days): 7-14 Period Pattern: (!) Irregular Menstrual Flow: Heavy Menstrual Control: Maxi pad Menstrual Control Change Freq (Hours): 4-6 Dysmenorrhea: (!) Moderate Dysmenorrhea Symptoms: Cramping, Headache  02/02/22 labs Hgb 13.2  She has had intermittent episodes of severe cramps, not passing clots. Happens when she isn't bleeding. BM every 2-3 days (normal). No bladder c/o.   She has migraines with auras.   No LMP recorded. (Menstrual status: Irregular Periods).          Sexually active: No.  The current method of family planning is nexplanon .    Exercising: Yes.     She was trying to get strong but the pain has stopped her.  Smoker:  yes, 3-5 cigarettes a day.   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  MMG:  none  BMD:   none  Colonoscopy: none  TDaP:  06/16/15 Gardasil: not sure    reports that she has been smoking cigarettes. She has been smoking an average of .25 packs per day. She has never used smokeless tobacco. She reports current alcohol use. She reports that she does not use drugs. She is working as a Pensions consultant. She is going to school for her GED, wants to study biology. Daughter is 76, son is 46.   Past Medical History:  Diagnosis Date   Allergic rhinitis    Anemia    Anorexia    Anxiety    GERD (gastroesophageal reflux disease)    Ovarian cyst    Post partum depression    was  prescribed meds, took 2, then stopped   Thyroglossal cyst    Vitamin D deficiency     Past Surgical History:  Procedure Laterality Date   WISDOM TOOTH EXTRACTION      Current Outpatient Medications  Medication Sig Dispense Refill   dicyclomine (BENTYL) 20 MG tablet Take 1 tablet (20 mg total) by mouth 2 (two) times daily. 20 tablet 0   hydrOXYzine (ATARAX) 25 MG tablet Take 1 tablet (25 mg total) by mouth 2 (two) times daily as needed. 30 tablet 0   Multiple Vitamin (MULTIVITAMIN) tablet Take 1 tablet by mouth daily.     Vitamin D, Ergocalciferol, (DRISDOL) 1.25 MG (50000 UNIT) CAPS capsule Take 50,000 Units by mouth every 7 (seven) days.     No current facility-administered medications for this visit.    Family History  Problem Relation Age of Onset   Thyroid disease Mother    Carpal tunnel syndrome Mother    Thyroid nodules Mother    Hypertension Father    Diabetes Father    Stroke Father     Review of Systems  Genitourinary:  Positive for pelvic pain and vaginal bleeding.  All other systems reviewed and are negative.   Exam:   BP 100/62   Pulse (!) 116   Ht 5' 3"$  (1.6 m)   Wt 102  lb (46.3 kg)   SpO2 97%   BMI 18.07 kg/m   Weight change: @WEIGHTCHANGE$ @ Height:   Height: 5' 3"$  (160 cm)  Ht Readings from Last 3 Encounters:  04/05/22 5' 3"$  (1.6 m)  01/02/22 5' 3"$  (1.6 m)  09/12/21 5' 3"$  (1.6 m)    General appearance: alert, cooperative and appears stated age Head: Normocephalic, without obvious abnormality, atraumatic Neck: no adenopathy, supple, symmetrical, trachea midline and thyroid normal to inspection and palpation Lungs: clear to auscultation bilaterally Cardiovascular: regular rate and rhythm Breasts: normal appearance, no masses or tenderness Abdomen: soft, non-tender; non distended,  no masses,  no organomegaly Extremities: extremities normal, atraumatic, no cyanosis or edema Skin: Skin color, texture, turgor normal. No rashes or lesions Lymph nodes:  Cervical, supraclavicular, and axillary nodes normal. No abnormal inguinal nodes palpated Neurologic: Grossly normal   Pelvic: External genitalia:  no lesions              Urethra:  normal appearing urethra with no masses, tenderness or lesions              Bartholins and Skenes: normal                 Vagina: normal appearing vagina with normal color and discharge, no lesions              Cervix: no cervical motion tenderness and no lesions               Bimanual Exam:  Uterus:  normal size, contour, position, consistency, mobility, non-tender              Adnexa: no mass, fullness, tenderness               Rectovaginal: Confirms               Anus:  normal sphincter tone, no lesions  Pelvic floor: not tender  Gae Dry, CMa chaperoned for the exam.  1. Well woman exam Discussed gardasil, information given  2. Screening for cervical cancer Overdue for a pap, last pap returned with HSIL, colpo biopsy only with atypia.  - Cytology - PAP  3. Screening examination for STD (sexually transmitted disease) - RPR - HIV Antibody (routine testing w rflx) - Hepatitis C antibody - Cytology - PAP  4. Abnormal uterine bleeding (AUB) - CBC - TSH - Ferritin - US PELVIS TRANSVAGINAL NON-OB (TV ONLY); Future  5. General counseling and advice on female contraception She needs to get her nexplanon out. It is deep in her arm, in the region of the nerves and vessels. Will refer to Surgery for removal -Not a candidate for OCP's -Can consider the mirena IUD or micronor.Will discuss further after her u/s. Not currently sexually active.   6. Combined abdominal and pelvic pain Benign exam.  - US PELVIS TRANSVAGINAL NON-OB (TV ONLY); Future - naproxen sodium (ANAPROX DS) 550 MG tablet; Take 1 tablet (550 mg total) by mouth 2 (two) times daily with a meal.  Dispense: 30 tablet; Refill: 1

## 2022-04-05 NOTE — Patient Instructions (Signed)

## 2022-04-06 ENCOUNTER — Telehealth: Payer: Self-pay | Admitting: *Deleted

## 2022-04-06 ENCOUNTER — Other Ambulatory Visit: Payer: Self-pay

## 2022-04-06 DIAGNOSIS — Z3046 Encounter for surveillance of implantable subdermal contraceptive: Secondary | ICD-10-CM

## 2022-04-06 LAB — CBC
HCT: 37.8 % (ref 35.0–45.0)
Hemoglobin: 12.5 g/dL (ref 11.7–15.5)
MCH: 29.1 pg (ref 27.0–33.0)
MCHC: 33.1 g/dL (ref 32.0–36.0)
MCV: 87.9 fL (ref 80.0–100.0)
MPV: 11.1 fL (ref 7.5–12.5)
Platelets: 176 10*3/uL (ref 140–400)
RBC: 4.3 10*6/uL (ref 3.80–5.10)
RDW: 13.1 % (ref 11.0–15.0)
WBC: 6.1 10*3/uL (ref 3.8–10.8)

## 2022-04-06 LAB — FERRITIN: Ferritin: 54 ng/mL (ref 16–154)

## 2022-04-06 LAB — HEPATITIS C ANTIBODY: Hepatitis C Ab: NONREACTIVE

## 2022-04-06 LAB — RPR: RPR Ser Ql: NONREACTIVE

## 2022-04-06 LAB — TSH: TSH: 0.78 mIU/L

## 2022-04-06 LAB — HIV ANTIBODY (ROUTINE TESTING W REFLEX): HIV 1&2 Ab, 4th Generation: NONREACTIVE

## 2022-04-06 NOTE — Telephone Encounter (Signed)
Prior autherization for naproxen approved. My chart message sent to patient to inform.

## 2022-04-06 NOTE — Telephone Encounter (Signed)
Prior Authorization started for Naproxen Sodium 546m  Cover mymeds key [id number]. Sent to plan waiting reply.

## 2022-04-06 NOTE — Telephone Encounter (Signed)
Call placed to [facility], [phone], left message on nurse line for return call.   Call placed to patient. Advised waiting for return call from [facility], once I hear back from them I will provide update. Patient states she is waiting for her work schedule to be released, will schedule PUS once that is received. Questions answered.  Patient request calls after 4pm.

## 2022-04-06 NOTE — Telephone Encounter (Signed)
-----   Message from Salvadore Dom, MD sent at 04/05/2022  3:14 PM EST ----- This patient is very thin, she has a nexplanon in her left arm, very difficult to feel and right where her nerve bundle is. She should see a Surgeon to have it removed.  Someone who is skilled with arm anatomy. We didn't place it.

## 2022-04-06 NOTE — Telephone Encounter (Signed)
Spoke with Abigail Butts at [employer].  Referral placed to Dr. Gurney Maxin.   Routing to [facility] to complete referral.

## 2022-04-08 LAB — CYTOLOGY - PAP
Chlamydia: NEGATIVE
Comment: NEGATIVE
Comment: NEGATIVE
Comment: NEGATIVE
Comment: NORMAL
High risk HPV: POSITIVE — AB
Neisseria Gonorrhea: NEGATIVE
Trichomonas: POSITIVE — AB

## 2022-04-12 ENCOUNTER — Other Ambulatory Visit: Payer: Self-pay

## 2022-04-12 DIAGNOSIS — R87612 Low grade squamous intraepithelial lesion on cytologic smear of cervix (LGSIL): Secondary | ICD-10-CM

## 2022-04-12 DIAGNOSIS — A599 Trichomoniasis, unspecified: Secondary | ICD-10-CM

## 2022-04-12 MED ORDER — METRONIDAZOLE 500 MG PO TABS: 500.0000 mg | ORAL_TABLET | Freq: Two times a day (BID) | ORAL | 0 refills | Status: AC

## 2022-04-18 NOTE — Telephone Encounter (Signed)
Spoke with patient and made aware referral was faxed last week to [facility]. Gave [facility]'s to pt in case she wants to contact them before they reach out to her.

## 2022-04-18 NOTE — Telephone Encounter (Signed)
Spoke with patient.  PUS scheduled for 06/14/22 at 4pm, consult to follow with Dr. Talbert Nan.   Patient is scheduled for colpo 05/18/22.   Patient has not heard from [facility] regarding referral to Dr. Kieth Brightly, will route to [facility] to f/u.  Patient agreeable.   Wyatt Portela -please f/u on referral.

## 2022-04-25 NOTE — Telephone Encounter (Signed)
Lisa Holt  You15 minutes ago (12:57 PM)   [facility] Pt is scheduled for 3/15 with Dr. Kieth Brightly  Routing to Dr. Rosann Auerbach.   Encounter closed.

## 2022-04-25 NOTE — Telephone Encounter (Signed)
Lisa Holt -can you please check status of referral?

## 2022-05-11 ENCOUNTER — Ambulatory Visit: Payer: Self-pay | Admitting: General Surgery

## 2022-05-12 ENCOUNTER — Encounter (HOSPITAL_BASED_OUTPATIENT_CLINIC_OR_DEPARTMENT_OTHER): Payer: Self-pay | Admitting: General Surgery

## 2022-05-12 NOTE — Progress Notes (Addendum)
Spoke w/ via phone for pre-op interview---Lisa Holt needs dos---- UPT              Holt results------ COVID test -----patient states asymptomatic no test needed Arrive at -------1100 NPO after MN NO Solid Food.  Clear liquids from MN until---1000 Med rec completed Medications to take morning of surgery -----NONE Diabetic medication ----- Patient instructed no nail polish to be worn day of surgery Patient instructed to bring photo id and insurance card day of surgery Patient aware to have Driver (ride ) / caregiver  [name]  for 24 hours after surgery  Patient Special Instructions ----Patient asked about removing her tongue ring, patient told and verbalized understanding that tongue ring would have to be removed. Pre-Op special Istructions ----- Patient verbalized understanding of instructions that were given at this phone interview. Patient denies shortness of breath, chest pain, fever, cough at this phone interview.  Patient stated she takes medication for anxiety/depression but is not home and unaware of the names of the medication. Patient to bring names and doses in on day of surgery.

## 2022-05-17 ENCOUNTER — Ambulatory Visit (HOSPITAL_BASED_OUTPATIENT_CLINIC_OR_DEPARTMENT_OTHER): Payer: Commercial Managed Care - HMO | Admitting: Certified Registered Nurse Anesthetist

## 2022-05-17 ENCOUNTER — Encounter (HOSPITAL_BASED_OUTPATIENT_CLINIC_OR_DEPARTMENT_OTHER): Payer: Self-pay | Admitting: General Surgery

## 2022-05-17 ENCOUNTER — Ambulatory Visit (HOSPITAL_BASED_OUTPATIENT_CLINIC_OR_DEPARTMENT_OTHER)
Admission: RE | Admit: 2022-05-17 | Discharge: 2022-05-17 | Disposition: A | Discharge status: Home / Self Care | Payer: Commercial Managed Care - HMO | Attending: General Surgery | Admitting: General Surgery

## 2022-05-17 ENCOUNTER — Encounter (HOSPITAL_BASED_OUTPATIENT_CLINIC_OR_DEPARTMENT_OTHER)
Admission: RE | Disposition: A | Discharge status: Home / Self Care | Payer: Self-pay | Source: Home / Self Care | Attending: General Surgery

## 2022-05-17 ENCOUNTER — Other Ambulatory Visit: Payer: Self-pay

## 2022-05-17 DIAGNOSIS — F17210 Nicotine dependence, cigarettes, uncomplicated: Secondary | ICD-10-CM | POA: Insufficient documentation

## 2022-05-17 DIAGNOSIS — Y831 Surgical operation with implant of artificial internal device as the cause of abnormal reaction of the patient, or of later complication, without mention of misadventure at the time of the procedure: Secondary | ICD-10-CM | POA: Diagnosis not present

## 2022-05-17 DIAGNOSIS — Z01818 Encounter for other preprocedural examination: Secondary | ICD-10-CM

## 2022-05-17 DIAGNOSIS — T85628A Displacement of other specified internal prosthetic devices, implants and grafts, initial encounter: Secondary | ICD-10-CM | POA: Diagnosis present

## 2022-05-17 DIAGNOSIS — Z3046 Encounter for surveillance of implantable subdermal contraceptive: Secondary | ICD-10-CM | POA: Diagnosis not present

## 2022-05-17 HISTORY — PX: FOREIGN BODY REMOVAL: SHX962

## 2022-05-17 LAB — POCT PREGNANCY, URINE: Preg Test, Ur: NEGATIVE

## 2022-05-17 SURGERY — FOREIGN BODY REMOVAL ADULT
Anesthesia: General | Site: Arm Upper | Laterality: Left

## 2022-05-17 MED ORDER — PHENYLEPHRINE 80 MCG/ML (10ML) SYRINGE FOR IV PUSH (FOR BLOOD PRESSURE SUPPORT)
PREFILLED_SYRINGE | INTRAVENOUS | Status: DC | PRN
  Administered 2022-05-17 (×3): 80 ug via INTRAVENOUS

## 2022-05-17 MED ORDER — CHLORHEXIDINE GLUCONATE CLOTH 2 % EX PADS: 6.0000 | MEDICATED_PAD | Freq: Once | CUTANEOUS | Status: DC

## 2022-05-17 MED ORDER — IBUPROFEN 800 MG PO TABS: 800.0000 mg | ORAL_TABLET | Freq: Three times a day (TID) | ORAL | 0 refills | Status: DC | PRN

## 2022-05-17 MED ORDER — CELECOXIB 200 MG PO CAPS
ORAL_CAPSULE | ORAL | Status: AC
  Filled 2022-05-17: qty 2

## 2022-05-17 MED ORDER — PHENYLEPHRINE 80 MCG/ML (10ML) SYRINGE FOR IV PUSH (FOR BLOOD PRESSURE SUPPORT)
PREFILLED_SYRINGE | INTRAVENOUS | Status: AC
  Filled 2022-05-17: qty 20

## 2022-05-17 MED ORDER — LIDOCAINE 2% (20 MG/ML) 5 ML SYRINGE
INTRAMUSCULAR | Status: DC | PRN
  Administered 2022-05-17: 60 mg via INTRAVENOUS

## 2022-05-17 MED ORDER — CEFAZOLIN SODIUM-DEXTROSE 2-4 GM/100ML-% IV SOLN
INTRAVENOUS | Status: AC
  Filled 2022-05-17: qty 100

## 2022-05-17 MED ORDER — ACETAMINOPHEN 500 MG PO TABS
ORAL_TABLET | ORAL | Status: AC
  Filled 2022-05-17: qty 2

## 2022-05-17 MED ORDER — DEXAMETHASONE SODIUM PHOSPHATE 10 MG/ML IJ SOLN
INTRAMUSCULAR | Status: AC
  Filled 2022-05-17: qty 1

## 2022-05-17 MED ORDER — OXYCODONE HCL 5 MG PO TABS: 5.0000 mg | ORAL_TABLET | Freq: Once | ORAL | Status: DC | PRN

## 2022-05-17 MED ORDER — ONDANSETRON HCL 4 MG/2ML IJ SOLN
INTRAMUSCULAR | Status: AC
  Filled 2022-05-17: qty 4

## 2022-05-17 MED ORDER — LACTATED RINGERS IV SOLN: INTRAVENOUS | Status: DC

## 2022-05-17 MED ORDER — DEXAMETHASONE SODIUM PHOSPHATE 10 MG/ML IJ SOLN
INTRAMUSCULAR | Status: DC | PRN
  Administered 2022-05-17: 10 mg via INTRAVENOUS

## 2022-05-17 MED ORDER — MIDAZOLAM HCL 2 MG/2ML IJ SOLN
INTRAMUSCULAR | Status: AC
  Filled 2022-05-17: qty 2

## 2022-05-17 MED ORDER — FENTANYL CITRATE (PF) 100 MCG/2ML IJ SOLN
INTRAMUSCULAR | Status: AC
  Filled 2022-05-17: qty 2

## 2022-05-17 MED ORDER — OXYCODONE HCL 5 MG/5ML PO SOLN: 5.0000 mg | Freq: Once | ORAL | Status: DC | PRN

## 2022-05-17 MED ORDER — BUPIVACAINE HCL 0.5 % IJ SOLN
INTRAMUSCULAR | Status: DC | PRN
  Administered 2022-05-17: 5 mL

## 2022-05-17 MED ORDER — PROMETHAZINE HCL 25 MG/ML IJ SOLN: 6.2500 mg | INTRAMUSCULAR | Status: DC | PRN

## 2022-05-17 MED ORDER — MIDAZOLAM HCL 2 MG/2ML IJ SOLN
INTRAMUSCULAR | Status: DC | PRN
  Administered 2022-05-17: 2 mg via INTRAVENOUS

## 2022-05-17 MED ORDER — ACETAMINOPHEN 500 MG PO TABS
1000.0000 mg | ORAL_TABLET | ORAL | Status: AC
  Administered 2022-05-17: 1000 mg via ORAL

## 2022-05-17 MED ORDER — ONDANSETRON HCL 4 MG/2ML IJ SOLN
INTRAMUSCULAR | Status: DC | PRN
  Administered 2022-05-17: 4 mg via INTRAVENOUS

## 2022-05-17 MED ORDER — CELECOXIB 200 MG PO CAPS
400.0000 mg | ORAL_CAPSULE | ORAL | Status: AC
  Administered 2022-05-17: 400 mg via ORAL

## 2022-05-17 MED ORDER — ENSURE PRE-SURGERY PO LIQD: 296.0000 mL | Freq: Once | ORAL | Status: DC

## 2022-05-17 MED ORDER — MEPERIDINE HCL 25 MG/ML IJ SOLN: 6.2500 mg | INTRAMUSCULAR | Status: DC | PRN

## 2022-05-17 MED ORDER — 0.9 % SODIUM CHLORIDE (POUR BTL) OPTIME
TOPICAL | Status: DC | PRN
  Administered 2022-05-17: 1000 mL

## 2022-05-17 MED ORDER — HYDROMORPHONE HCL 1 MG/ML IJ SOLN: 0.2500 mg | INTRAMUSCULAR | Status: DC | PRN

## 2022-05-17 MED ORDER — FENTANYL CITRATE (PF) 250 MCG/5ML IJ SOLN
INTRAMUSCULAR | Status: DC | PRN
  Administered 2022-05-17: 50 ug via INTRAVENOUS

## 2022-05-17 MED ORDER — PROPOFOL 10 MG/ML IV BOLUS
INTRAVENOUS | Status: DC | PRN
  Administered 2022-05-17: 200 mg via INTRAVENOUS

## 2022-05-17 MED ORDER — LIDOCAINE HCL (PF) 2 % IJ SOLN
INTRAMUSCULAR | Status: AC
  Filled 2022-05-17: qty 5

## 2022-05-17 MED ORDER — AMISULPRIDE (ANTIEMETIC) 5 MG/2ML IV SOLN: 10.0000 mg | Freq: Once | INTRAVENOUS | Status: DC | PRN

## 2022-05-17 MED ORDER — CEFAZOLIN SODIUM-DEXTROSE 2-4 GM/100ML-% IV SOLN
2.0000 g | INTRAVENOUS | Status: AC
  Administered 2022-05-17: 2 g via INTRAVENOUS

## 2022-05-17 MED ORDER — EPHEDRINE 5 MG/ML INJ
INTRAVENOUS | Status: AC
  Filled 2022-05-17: qty 15

## 2022-05-17 SURGICAL SUPPLY — 52 items
APL PRP STRL LF DISP 70% ISPRP (MISCELLANEOUS) ×1
APL SKNCLS STERI-STRIP NONHPOA (GAUZE/BANDAGES/DRESSINGS)
BENZOIN TINCTURE PRP APPL 2/3 (GAUZE/BANDAGES/DRESSINGS) IMPLANT
BLADE CLIPPER SENSICLIP SURGIC (BLADE) IMPLANT
BLADE SURG 15 STRL LF DISP TIS (BLADE) ×1 IMPLANT
BLADE SURG 15 STRL SS (BLADE) ×1
BNDG GAUZE DERMACEA FLUFF 4 (GAUZE/BANDAGES/DRESSINGS) IMPLANT
BNDG GZE DERMACEA 4 6PLY (GAUZE/BANDAGES/DRESSINGS)
CHLORAPREP W/TINT 26 (MISCELLANEOUS) ×1 IMPLANT
COVER BACK TABLE 60X90IN (DRAPES) ×1 IMPLANT
COVER MAYO STAND STRL (DRAPES) ×1 IMPLANT
DRAIN PENROSE 0.25X18 (DRAIN) IMPLANT
DRAIN PENROSE 0.5X18 (DRAIN) IMPLANT
DRAPE LAPAROTOMY 100X72 PEDS (DRAPES) ×1 IMPLANT
DRAPE UTILITY XL STRL (DRAPES) ×1 IMPLANT
DRSG TEGADERM 4X4.75 (GAUZE/BANDAGES/DRESSINGS) IMPLANT
ELECT COATED BLADE 2.86 ST (ELECTRODE) IMPLANT
ELECT REM PT RETURN 9FT ADLT (ELECTROSURGICAL) ×1
ELECTRODE REM PT RTRN 9FT ADLT (ELECTROSURGICAL) ×1 IMPLANT
GAUZE 4X4 16PLY ~~LOC~~+RFID DBL (SPONGE) ×1 IMPLANT
GAUZE SPONGE 4X4 12PLY STRL (GAUZE/BANDAGES/DRESSINGS) IMPLANT
GLOVE BIOGEL PI IND STRL 7.0 (GLOVE) ×1 IMPLANT
GLOVE SURG SS PI 7.0 STRL IVOR (GLOVE) ×1 IMPLANT
GOWN STRL REUS W/TWL LRG LVL3 (GOWN DISPOSABLE) ×1 IMPLANT
KIT TURNOVER CYSTO (KITS) ×1 IMPLANT
NDL HYPO 25X1 1.5 SAFETY (NEEDLE) ×1 IMPLANT
NEEDLE HYPO 25X1 1.5 SAFETY (NEEDLE) ×1 IMPLANT
NS IRRIG 500ML POUR BTL (IV SOLUTION) IMPLANT
PACK BASIN DAY SURGERY FS (CUSTOM PROCEDURE TRAY) ×1 IMPLANT
PENCIL SMOKE EVACUATOR (MISCELLANEOUS) ×1 IMPLANT
SLEEVE SCD COMPRESS KNEE MED (STOCKING) ×1 IMPLANT
SPIKE FLUID TRANSFER (MISCELLANEOUS) IMPLANT
SPONGE T-LAP 4X18 ~~LOC~~+RFID (SPONGE) IMPLANT
STRIP CLOSURE SKIN 1/2X4 (GAUZE/BANDAGES/DRESSINGS) IMPLANT
SUCTION FRAZIER HANDLE 10FR (MISCELLANEOUS)
SUCTION TUBE FRAZIER 10FR DISP (MISCELLANEOUS) IMPLANT
SUT ETHILON 3 0 FSL (SUTURE) ×1 IMPLANT
SUT MNCRL AB 4-0 PS2 18 (SUTURE) IMPLANT
SUT SILK 3 0 TIES 17X18 (SUTURE)
SUT SILK 3-0 18XBRD TIE BLK (SUTURE) IMPLANT
SUT VIC AB 2-0 SH 27 (SUTURE)
SUT VIC AB 2-0 SH 27XBRD (SUTURE) IMPLANT
SUT VIC AB 3-0 SH 27 (SUTURE)
SUT VIC AB 3-0 SH 27X BRD (SUTURE) IMPLANT
SUT VIC AB 3-0 SH 8-18 (SUTURE) IMPLANT
SWAB COLLECTION DEVICE MRSA (MISCELLANEOUS) IMPLANT
SWAB CULTURE ESWAB REG 1ML (MISCELLANEOUS) IMPLANT
SYR BULB IRRIG 60ML STRL (SYRINGE) IMPLANT
SYR CONTROL 10ML LL (SYRINGE) ×1 IMPLANT
TOWEL OR 17X24 6PK STRL BLUE (TOWEL DISPOSABLE) ×1 IMPLANT
TUBE CONNECTING 12X1/4 (SUCTIONS) ×1 IMPLANT
YANKAUER SUCT BULB TIP NO VENT (SUCTIONS) IMPLANT

## 2022-05-17 NOTE — Progress Notes (Signed)
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 No need for sling per Dr. Kieth Brightly. May apply ace wrap.  No activity restrictions per Dr. Kieth Brightly except to let pain be her guide as far as her activity. Patient informed and instructed on above. Voiced understanding. Patient concerned about having to return to work tomorrow. I informed her of her anesthesia instructions specifying restrictions for the next 24 hrs and if she needed anything more to contact Dr. Amie Portland office. Form given stating she was here  today, accompanied by her mom- Lisa Holt per protocol. Spoke with Lisa Holt while walking back to see patient and asked if she would be with patient for first 24 hrs.  She said she could do that. Lisa Holt informed of patient's concerns and She voice understanding.

## 2022-05-17 NOTE — Discharge Instructions (Addendum)
   Call your surgeon if you experience:   1.  Fever over 101.0. 2.  Inability to urinate. 3.  Nausea and/or vomiting. 4.  Extreme swelling or bruising at the surgical site. 5.  Continued bleeding from the incision. 6.  Increased pain, redness or drainage from the incision. 7.  Problems related to your pain medication. 8.  Any problems and/or concerns  Use Ace wrap to support incision.  No lifting restrictions except to let pain be your guide. If an activity is causing pain it's your body's way of saying you are not ready for that yet. If you have any questions or issues you may call Dr. Amie Portland office.   Post Anesthesia Home Care Instructions  Activity: Get plenty of rest for the remainder of the day. A responsible individual must stay with you for 24 hours following the procedure.  For the next 24 hours, DO NOT: -Drive a car -Paediatric nurse -Drink alcoholic beverages -Take any medication unless instructed by your physician -Make any legal decisions or sign important papers.  Meals: Start with liquid foods such as gelatin or soup. Progress to regular foods as tolerated. Avoid greasy, spicy, heavy foods. If nausea and/or vomiting occur, drink only clear liquids until the nausea and/or vomiting subsides. Call your physician if vomiting continues.  Special Instructions/Symptoms: Your throat may feel dry or sore from the anesthesia or the breathing tube placed in your throat during surgery. If this causes discomfort, gargle with warm salt water. The discomfort should disappear within 24 hours.  If you had a scopolamine patch placed behind your ear for the management of post- operative nausea and/or vomiting:  1. The medication in the patch is effective for 72 hours, after which it should be removed.  Wrap patch in a tissue and discard in the trash. Wash hands thoroughly with soap and water. 2. You may remove the patch earlier than 72 hours if you experience unpleasant side  effects which may include dry mouth, dizziness or visual disturbances. 3. Avoid touching the patch. Wash your hands with soap and water after contact with the patch.

## 2022-05-17 NOTE — H&P (Signed)
  Chief Complaint: Discuss Nexplanon Removal  History of Present Illness: Lisa Holt is a 31 y.o. female who is seen today as an office consultation for evaluation of Discuss Nexplanon Removal  She had a Nexplanon placed 8 years ago. She had 1 previous attempt of removal multiple years ago. She has had localization imaging studies of an ultrasound of the arm and CT of the chest both which did not show a foreign body. Her previous provider told her that she likely pooped out the foreign body.  Review of Systems: A complete review of systems was obtained from the patient. I have reviewed this information and discussed as appropriate with the patient. See HPI as well for other ROS.  Review of Systems Constitutional: Negative. HENT: Negative. Eyes: Negative. Respiratory: Negative. Cardiovascular: Negative. Gastrointestinal: Negative. Genitourinary: Negative. Musculoskeletal: Negative. Skin: Negative. Neurological: Negative. Endo/Heme/Allergies: Negative. Psychiatric/Behavioral: Negative.   Medical History: Past Medical History: Diagnosis Date Anxiety  There is no problem list on file for this patient.  History reviewed. No pertinent surgical history.  Allergies Allergen Reactions Latex Itching and Swelling  Current Outpatient Medications on File Prior to Visit Medication Sig Dispense Refill hydrOXYzine (ATARAX) 25 MG tablet Take by mouth acetaminophen (TYLENOL) 650 MG ER tablet Take 650 mg by mouth every 8 (eight) hours as needed for Pain ferrous sulfate 325 (65 FE) MG EC tablet Take 325 mg by mouth daily with breakfast multivitamin tablet Take 1 tablet by mouth once daily  No current facility-administered medications on file prior to visit.  Family History Problem Relation Age of Onset High blood pressure (Hypertension) Mother   Social History  Tobacco Use Smoking Status Every Day Types: Cigarettes Smokeless Tobacco Never   Social History  Socioeconomic  History Marital status: Single Tobacco Use Smoking status: Every Day Types: Cigarettes Smokeless tobacco: Never Substance and Sexual Activity Alcohol use: Yes Comment: Occasioanlly Drug use: Never  Objective:  Vitals: 05/05/22 1532 BP: 106/68 Pulse: 93 Temp: 36.7 C (98.1 F) SpO2: 99% Weight: 46.8 kg (103 lb 3.2 oz) Height: 160 cm (5\' 3" ) PainSc: 2  Body mass index is 18.28 kg/m.  Physical Exam Constitutional: Appearance: Normal appearance. HENT: Head: Normocephalic and atraumatic. Pulmonary: Effort: Pulmonary effort is normal. Musculoskeletal: General: Normal range of motion. Cervical back: Normal range of motion. Skin: Comments: Palpable linear foreign body in the left upper arm Neurological: General: No focal deficit present. Mental Status: She is alert and oriented to person, place, and time. Mental status is at baseline. Psychiatric: Mood and Affect: Mood normal. Behavior: Behavior normal. Thought Content: Thought content normal.   Labs, Imaging and Diagnostic Testing: I reviewed Dede Query notes I reviewed CT scan images from September 2021 which did not visualize any foreign body in the chest or left shoulder. I reviewed ultrasound from September 2021 which did not identify any foreign body in the left arm. I reviewed images of the chest x-ray from November 2019 and November 2022 both of which identify radiopaque foreign body in the left medial upper arm.

## 2022-05-17 NOTE — Anesthesia Procedure Notes (Signed)
Procedure Name: LMA Insertion Date/Time: 05/17/2022 2:11 PM  Performed by: Clearnce Sorrel, CRNAPre-anesthesia Checklist: Patient identified, Emergency Drugs available, Suction available and Patient being monitored Patient Re-evaluated:Patient Re-evaluated prior to induction Oxygen Delivery Method: Circle System Utilized Preoxygenation: Pre-oxygenation with 100% oxygen Induction Type: IV induction Ventilation: Mask ventilation without difficulty LMA: LMA inserted LMA Size: 4.0 Number of attempts: 1 Airway Equipment and Method: Bite block Placement Confirmation: positive ETCO2 Tube secured with: Tape Dental Injury: Teeth and Oropharynx as per pre-operative assessment

## 2022-05-17 NOTE — Anesthesia Preprocedure Evaluation (Signed)
Anesthesia Evaluation  Patient identified by MRN, date of birth, ID band Patient awake    Reviewed: Allergy & Precautions, NPO status , Patient's Chart, lab work & pertinent test results  History of Anesthesia Complications Negative for: history of anesthetic complications  Airway Mallampati: II  TM Distance: >3 FB Neck ROM: Full    Dental  (+) Teeth Intact, Dental Advisory Given   Pulmonary Current Smoker and Patient abstained from smoking.   Pulmonary exam normal breath sounds clear to auscultation       Cardiovascular negative cardio ROS Normal cardiovascular exam Rhythm:Regular Rate:Normal     Neuro/Psych  PSYCHIATRIC DISORDERS Anxiety Depression    negative neurological ROS     GI/Hepatic Neg liver ROS,GERD  ,,  Endo/Other  negative endocrine ROS    Renal/GU negative Renal ROS     Musculoskeletal negative musculoskeletal ROS (+)    Abdominal   Peds  Hematology  (+) Blood dyscrasia, anemia Plt 240k   Anesthesia Other Findings Day of surgery medications reviewed with the patient.  Reproductive/Obstetrics G2P1                              Anesthesia Physical Anesthesia Plan  ASA: II  Anesthesia Plan: General   Post-op Pain Management: Celebrex PO (pre-op)* and Tylenol PO (pre-op)*   Induction: Intravenous  PONV Risk Score and Plan: 2 and Ondansetron, Midazolam and Treatment may vary due to age or medical condition  Airway Management Planned: LMA  Additional Equipment:   Intra-op Plan:   Post-operative Plan:   Informed Consent: I have reviewed the patients History and Physical, chart, labs and discussed the procedure including the risks, benefits and alternatives for the proposed anesthesia with the patient or authorized representative who has indicated his/her understanding and acceptance.     Dental advisory given  Plan Discussed with:   Anesthesia Plan Comments:          Anesthesia Quick Evaluation

## 2022-05-17 NOTE — Op Note (Signed)
Preoperative diagnosis: foreign body in left upper arm  Postoperative diagnosis: same   Procedure: removal of foreign body in left upper arm, subfascial  Surgeon: Gurney Maxin, M.D.  Asst: none  Anesthesia: LMA  Indications for procedure: Lisa Holt is a 32 y.o. year old female with history of nexplanon device that has been unable to be removed. She presents for surgical removal.  Description of procedure: The patient was brought into the operative suite. Anesthesia was administered with General LMA anesthesia. WHO checklist was applied. The patient was then placed in supine position. The area was prepped and draped in the usual sterile fashion.  Next, ultrasound was used to identify the foreign body. It was located in the medial left arm. Marcaine was injected over the foreign body. A small incision was made. Blunt dissection was used to identify the foreign body. Palpation and additional ultrasound was used to identify the foreign body. The foreign body was just beneath the fascia of the bicep muscle. Cautery was used to create a small hole in the fascia to identify the foreign body. The foreign body was isolated away from the surrounding tissue and removed. The subcutaneous tissue was closed with 3-0 vicryl. The skin was closed with interrupted 4-0 monocryl. Dermabond was put in place for dressing.  Findings: nexplanon device within the bicep muscle  Specimen: nexplanon device  Implant: none   Blood loss: 10 ml  Local anesthesia:  10 ml marcaine   Complications: none    Gurney Maxin, M.D. General, Bariatric, & Minimally Invasive Surgery [facility]

## 2022-05-17 NOTE — Transfer of Care (Signed)
Immediate Anesthesia Transfer of Care Note  Patient: Lisa Holt  Procedure(s) Performed: EXCISION FOREIGN BODY LEFT ARM (Left: Arm Upper)  Patient Location: PACU  Anesthesia Type:General  Level of Consciousness: awake, alert , and oriented  Airway & Oxygen Therapy: Patient Spontanous Breathing  Post-op Assessment: Report given to RN and Post -op Vital signs reviewed and stable  Post vital signs: Reviewed and stable  Last Vitals:  Vitals Value Taken Time  BP 103/80 05/17/22 1456  Temp    Pulse 65 05/17/22 1457  Resp 9 05/17/22 1457  SpO2 96 % 05/17/22 1457  Vitals shown include unvalidated device data.  Last Pain:  Vitals:   05/17/22 1133  TempSrc: Oral  PainSc: 0-No pain      Patients Stated Pain Goal: 5 ([id number])  Complications: No notable events documented.

## 2022-05-18 ENCOUNTER — Encounter (HOSPITAL_BASED_OUTPATIENT_CLINIC_OR_DEPARTMENT_OTHER): Payer: Self-pay | Admitting: General Surgery

## 2022-05-18 ENCOUNTER — Ambulatory Visit: Payer: Commercial Managed Care - HMO | Admitting: Obstetrics and Gynecology

## 2022-05-18 NOTE — Anesthesia Postprocedure Evaluation (Signed)
Anesthesia Post Note  Patient: Lisa Holt  Procedure(s) Performed: EXCISION FOREIGN BODY LEFT ARM (Left: Arm Upper)     Patient location during evaluation: PACU Anesthesia Type: General Level of consciousness: awake and alert Pain management: pain level controlled Vital Signs Assessment: post-procedure vital signs reviewed and stable Respiratory status: spontaneous breathing, nonlabored ventilation and respiratory function stable Cardiovascular status: blood pressure returned to baseline and stable Postop Assessment: no apparent nausea or vomiting Anesthetic complications: no   No notable events documented.  Last Vitals:  Vitals:   05/17/22 1530 05/17/22 1600  BP: 112/77 99/66  Pulse: 61   Resp: 11 16  Temp: 36.7 C 36.9 C  SpO2: 100%     Last Pain:  Vitals:   05/17/22 1600  TempSrc: Oral  PainSc:    Pain Goal: Patients Stated Pain Goal: 6 (05/17/22 1600)                 Lynda Rainwater

## 2022-05-18 NOTE — Progress Notes (Deleted)
GYNECOLOGY  VISIT   HPI: 32 y.o.   Single   Black or African American Other or two or more races Unavailable Not Hispanic or Latino  female   [phone] with Patient's last menstrual period was 04/22/2022 (approximate).   here for evaluation of an abnormal pap. Her pap from 04/05/22 returned with LSIL, +HPV, + trich. Other STD testing was negative. She was treated for the trich (no current partner).  Her last prior pap was in 1/17 and returned with HSIL, colpo was done in 8/17 and returned with koilocytic atypia.   She had her nexplanon device removed by a General Surgeon yesterday (was overdue for removal and not palpable).   GYNECOLOGIC HISTORY: Patient's last menstrual period was 04/22/2022 (approximate). Contraception:*** Menopausal hormone therapy: ***        OB History     Gravida  2   Para  2   Term  2   Preterm      AB      Living  2      SAB      IAB      Ectopic      Multiple  0   Live Births  2              Patient Active Problem List   Diagnosis Date Noted   Bilateral hand pain 01/10/2021   Trigger finger of left hand 01/10/2021   Surveillance of previously prescribed implantable subdermal contraceptive 10/16/2019   Right ovarian cyst 09/18/2018   Chest pain 01/21/2018   Hypokalemia 01/21/2018   Hypomagnesemia 01/21/2018   Influenza due to influenza virus, type B 01/20/2018   HSIL (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 08/06/2015    Past Medical History:  Diagnosis Date   Allergic rhinitis    Anemia    Anorexia    Anxiety    GERD (gastroesophageal reflux disease)    Ovarian cyst    Post partum depression    was prescribed meds, took 2, then stopped   Thyroglossal cyst    Vitamin D deficiency     Past Surgical History:  Procedure Laterality Date   WISDOM TOOTH EXTRACTION      Current Outpatient Medications  Medication Sig Dispense Refill   acetaminophen (TYLENOL) 325 MG tablet Take 650 mg by mouth  every 6 (six) hours as needed for moderate pain or mild pain.     ibuprofen (ADVIL) 800 MG tablet Take 1 tablet (800 mg total) by mouth every 8 (eight) hours as needed. 20 tablet 0   Multiple Vitamin (MULTIVITAMIN) tablet Take 1 tablet by mouth daily.     No current facility-administered medications for this visit.     ALLERGIES: Latex  Family History  Problem Relation Age of Onset   Thyroid disease Mother    Carpal tunnel syndrome Mother    Thyroid nodules Mother    Hypertension Father    Diabetes Father    Stroke Father     Social History   Socioeconomic History   Marital status: Single    Spouse name: Not on file   Number of children: 2   Years of education: Not on file   Highest education level: Not on file  Occupational History   Not on file  Tobacco Use   Smoking status: Every Day    Packs/day: .25    Types: Cigarettes   Smokeless tobacco: Never  Vaping Use   Vaping Use: Never used  Substance and Sexual  Activity   Alcohol use: Yes    Comment: occ   Drug use: No    Types: Marijuana, Cocaine    Comment: las Cocaine use Nov. 2016, last marijuana 02/22/15   Sexual activity: Not Currently    Birth control/protection: Implant, None  Other Topics Concern   Not on file  Social History Narrative   Not on file   Social Determinants of Health   Financial Resource Strain: Not on file  Food Insecurity: Not on file  Transportation Needs: Not on file  Physical Activity: Not on file  Stress: Not on file  Social Connections: Not on file  Intimate Partner Violence: Not on file    ROS  PHYSICAL EXAMINATION:    LMP 04/22/2022 (Approximate)     General appearance: alert, cooperative and appears stated age Neck: no adenopathy, supple, symmetrical, trachea midline and thyroid {CHL AMB PHY EX THYROID NORM [phone]::"normal to inspection and palpation"} Breasts: {Exam; breast:13139::"normal appearance, no masses or tenderness"} Abdomen: soft, non-tender; non  distended, no masses,  no organomegaly  Pelvic: External genitalia:  no lesions              Urethra:  normal appearing urethra with no masses, tenderness or lesions              Bartholins and Skenes: normal                 Vagina: normal appearing vagina with normal color and discharge, no lesions              Cervix: {CHL AMB PHY EX CERVIX NORM DEFAULT:[phone]::"no lesions"}              Bimanual Exam:  Uterus:  {CHL AMB PHY EX UTERUS NORM DEFAULT:[phone]::"normal size, contour, position, consistency, mobility, non-tender"}              Adnexa: {CHL AMB PHY EX ADNEXA NO MASS [phone]::"no mass, fullness, tenderness"}              Rectovaginal: {yes no:[id number]}.  Confirms.              Anus:  normal sphincter tone, no lesions  Chaperone was present for exam.  ASSESSMENT     PLAN    An After Visit Summary was printed and given to the patient.  *** minutes face to face time of which over 50% was spent in counseling.

## 2022-06-14 ENCOUNTER — Other Ambulatory Visit: Payer: Medicaid Other

## 2022-06-14 ENCOUNTER — Other Ambulatory Visit: Payer: Medicaid Other | Admitting: Obstetrics and Gynecology

## 2022-06-14 NOTE — Progress Notes (Deleted)
No show

## 2022-06-26 ENCOUNTER — Telehealth: Payer: Medicaid Other | Admitting: Nurse Practitioner

## 2022-06-26 DIAGNOSIS — M25551 Pain in right hip: Secondary | ICD-10-CM

## 2022-06-26 MED ORDER — IBUPROFEN 800 MG PO TABS: 800.0000 mg | ORAL_TABLET | Freq: Three times a day (TID) | ORAL | 0 refills | Status: DC | PRN

## 2022-06-26 MED ORDER — CYCLOBENZAPRINE HCL 5 MG PO TABS: 5.0000 mg | ORAL_TABLET | Freq: Three times a day (TID) | ORAL | 1 refills | Status: DC | PRN

## 2022-06-26 NOTE — Patient Instructions (Signed)
  [name], thank you for joining Claiborne Rigg, NP for today's virtual visit.  While this provider is not your primary care provider (PCP), if your PCP is located in our provider database this encounter information will be shared with them immediately following your visit.   A [facility] MyChart account gives you access to today's visit and all your visits, tests, and labs performed at [facility] " click here if you don't have a [facility] MyChart account or go to [email or web address]  Consent: (Patient) KAILEEN BUNTAIN provided verbal consent for this virtual visit at the beginning of the encounter.  Current Medications:  Current Outpatient Medications:    acetaminophen (TYLENOL) 325 MG tablet, Take 650 mg by mouth every 6 (six) hours as needed for moderate pain or mild pain., Disp: , Rfl:    cyclobenzaprine (FLEXERIL) 5 MG tablet, Take 1 tablet (5 mg total) by mouth 3 (three) times daily as needed for muscle spasms., Disp: 30 tablet, Rfl: 1   ibuprofen (ADVIL) 800 MG tablet, Take 1 tablet (800 mg total) by mouth every 8 (eight) hours as needed., Disp: 20 tablet, Rfl: 0   Multiple Vitamin (MULTIVITAMIN) tablet, Take 1 tablet by mouth daily., Disp: , Rfl:    Medications ordered in this encounter:  Meds ordered this encounter  Medications   ibuprofen (ADVIL) 800 MG tablet    Sig: Take 1 tablet (800 mg total) by mouth every 8 (eight) hours as needed.    Dispense:  20 tablet    Refill:  0    Order Specific Question:   Supervising Provider    Answer:   Merrilee Jansky [[id number]]   cyclobenzaprine (FLEXERIL) 5 MG tablet    Sig: Take 1 tablet (5 mg total) by mouth 3 (three) times daily as needed for muscle spasms.    Dispense:  30 tablet    Refill:  1    Order Specific Question:   Supervising Provider    Answer:   Merrilee Jansky [id number]     *If you need refills on other medications prior to your next appointment, please contact your  pharmacy*  Follow-Up: Call back or seek an in-person evaluation if the symptoms worsen or if the condition fails to improve as anticipated.  [facility] [phone]  Other Instructions She also notes "colostrum" coming from right breast along with inverted nipple and blister under nipple. I have instructed her that she would require an in person visit with PCP for this to include blood work, possible [place] and or mammogram. She was given the [facility] phone number [phone] to establish with a PCP   If you have been instructed to have an in-person evaluation today at a local Urgent Care facility, please use the link below. It will take you to a list of all of our available [facility], including address, phone number and hours of operation. Please do not delay care.  [facility]  If you or a family member do not have a primary care provider, use the link below to schedule a visit and establish care. When you choose a [facility] primary care physician or advanced practice provider, you gain a long-term partner in health. Find a Primary Care Provider  Learn more about [facility] in-office and virtual care options: [facility] - Get Care Now

## 2022-06-26 NOTE — Progress Notes (Signed)
Virtual Visit Consent   Lisa Holt, you are scheduled for a virtual visit with a [employer] provider today. Just as with appointments in the office, your consent must be obtained to participate. Your consent will be active for this visit and any virtual visit you may have with one of our providers in the next 365 days. If you have a MyChart account, a copy of this consent can be sent to you electronically.  As this is a virtual visit, video technology does not allow for your provider to perform a traditional examination. This may limit your provider's ability to fully assess your condition. If your provider identifies any concerns that need to be evaluated in person or the need to arrange testing (such as labs, EKG, etc.), we will make arrangements to do so. Although advances in technology are sophisticated, we cannot ensure that it will always work on either your end or our end. If the connection with a video visit is poor, the visit may have to be switched to a telephone visit. With either a video or telephone visit, we are not always able to ensure that we have a secure connection.  By engaging in this virtual visit, you consent to the provision of healthcare and authorize for your insurance to be billed (if applicable) for the services provided during this visit. Depending on your insurance coverage, you may receive a charge related to this service.  I need to obtain your verbal consent now. Are you willing to proceed with your visit today? [name] has provided verbal consent on 06/26/2022 for a virtual visit (video or telephone). Claiborne Rigg, NP  Date: 06/26/2022 10:47 AM  Virtual Visit via Video Note   I, Claiborne Rigg, connected with  Lisa ROZNOWSKI  ([id number], 28-Jun-1990) on 06/26/22 at 10:45 AM EDT by a video-enabled telemedicine application and verified that I am speaking with the correct person using two identifiers.  Location: Patient: Virtual Visit Location Patient:  Home Provider: Virtual Visit Location Provider: Home Office   I discussed the limitations of evaluation and management by telemedicine and the availability of in person appointments. The patient expressed understanding and agreed to proceed.    History of Present Illness: Lisa Holt is a 32 y.o. who identifies as a female who was assigned female at birth, and is being seen today for right hip pain.  Hip Pain: Patient complains of right hip pain. Onset of the symptoms was several months ago. Inciting event: none. Current symptoms include is worse with weight bearing, is aggravated by walking, and radiates to right calf and right foot . Associated symptoms: giving out sensation in the right leg. Aggravating symptoms: any weight bearing, standing, and walking. Patient's overall course: gradually worsening. Patient has had prior hip problems. Previous visits for this problem: none. Evaluation to date: none.  Treatment to date: none.    She also notes "colostrum" coming from right breast along with inverted nipple and blister under nipple. I have instructed her that she would require an in person visit with PCP for this to include blood work, possible [place] and or mammogram.    Problems:  Patient Active Problem List   Diagnosis Date Noted   Bilateral hand pain 01/10/2021   Trigger finger of left hand 01/10/2021   Surveillance of previously prescribed implantable subdermal contraceptive 10/16/2019   Right ovarian cyst 09/18/2018   Chest pain 01/21/2018   Hypokalemia 01/21/2018   Hypomagnesemia 01/21/2018   Influenza due to  influenza virus, type B 01/20/2018   HSIL (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 08/06/2015    Allergies:  Allergies  Allergen Reactions   Latex Itching and Swelling   Medications:  Current Outpatient Medications:    acetaminophen (TYLENOL) 325 MG tablet, Take 650 mg by mouth every 6 (six) hours as needed for moderate pain or mild pain.,  Disp: , Rfl:    cyclobenzaprine (FLEXERIL) 5 MG tablet, Take 1 tablet (5 mg total) by mouth 3 (three) times daily as needed for muscle spasms., Disp: 30 tablet, Rfl: 1   ibuprofen (ADVIL) 800 MG tablet, Take 1 tablet (800 mg total) by mouth every 8 (eight) hours as needed., Disp: 20 tablet, Rfl: 0   Multiple Vitamin (MULTIVITAMIN) tablet, Take 1 tablet by mouth daily., Disp: , Rfl:   Observations/Objective: Patient is well-developed, well-nourished in no acute distress.  Resting comfortably  at home.  Head is normocephalic, atraumatic.  No labored breathing.  Speech is clear and coherent with logical content.  Patient is alert and oriented at baseline.    Assessment and Plan: 1. Right hip pain - ibuprofen (ADVIL) 800 MG tablet; Take 1 tablet (800 mg total) by mouth every 8 (eight) hours as needed.  Dispense: 20 tablet; Refill: 0 - cyclobenzaprine (FLEXERIL) 5 MG tablet; Take 1 tablet (5 mg total) by mouth 3 (three) times daily as needed for muscle spasms.  Dispense: 30 tablet; Refill: 1   Follow Up Instructions: I discussed the assessment and treatment plan with the patient. The patient was provided an opportunity to ask questions and all were answered. The patient agreed with the plan and demonstrated an understanding of the instructions.  A copy of instructions were sent to the patient via MyChart unless otherwise noted below.   The patient was advised to call back or seek an in-person evaluation if the symptoms worsen or if the condition fails to improve as anticipated.  Time:  I spent 12 minutes with the patient via telehealth technology discussing the above problems/concerns.    Claiborne Rigg, NP

## 2022-07-06 ENCOUNTER — Ambulatory Visit: Payer: Commercial Managed Care - HMO | Admitting: Obstetrics and Gynecology

## 2022-07-06 ENCOUNTER — Encounter: Payer: Self-pay | Admitting: Obstetrics and Gynecology

## 2022-07-06 NOTE — Progress Notes (Deleted)
GYNECOLOGY  VISIT   HPI: 32 y.o.   Single   Black or African American Other or two or more races Unavailable Not Hispanic or Latino  female   [phone] with No LMP recorded. (Menstrual status: Irregular Periods).   here for evaluation of a LSIL/+HPV pap and f/u testing for trichomonas (treated in 2/24).  She had a deeply implanted nexplanon removed by General surgery in 3/24  GYNECOLOGIC HISTORY: No LMP recorded. (Menstrual status: Irregular Periods). Contraception:*** Menopausal hormone therapy: ***        OB History     Gravida  2   Para  2   Term  2   Preterm      AB      Living  2      SAB      IAB      Ectopic      Multiple  0   Live Births  2              Patient Active Problem List   Diagnosis Date Noted   Bilateral hand pain 01/10/2021   Trigger finger of left hand 01/10/2021   Surveillance of previously prescribed implantable subdermal contraceptive 10/16/2019   Right ovarian cyst 09/18/2018   Chest pain 01/21/2018   Hypokalemia 01/21/2018   Hypomagnesemia 01/21/2018   Influenza due to influenza virus, type B 01/20/2018   HSIL (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 08/06/2015    Past Medical History:  Diagnosis Date   Allergic rhinitis    Anemia    Anorexia    Anxiety    GERD (gastroesophageal reflux disease)    Migraine with aura    Ovarian cyst    Post partum depression    was prescribed meds, took 2, then stopped   Thyroglossal cyst    Vitamin D deficiency     Past Surgical History:  Procedure Laterality Date   FOREIGN BODY REMOVAL Left 05/17/2022   Procedure: EXCISION FOREIGN BODY LEFT ARM;  Surgeon: Rodman Pickle, MD;  Location: [facility];  Service: General;  Laterality: Left;  LMA   WISDOM TOOTH EXTRACTION      Current Outpatient Medications  Medication Sig Dispense Refill   acetaminophen (TYLENOL) 325 MG tablet Take 650 mg by mouth every 6 (six) hours as needed for  moderate pain or mild pain.     cyclobenzaprine (FLEXERIL) 5 MG tablet Take 1 tablet (5 mg total) by mouth 3 (three) times daily as needed for muscle spasms. 30 tablet 1   ibuprofen (ADVIL) 800 MG tablet Take 1 tablet (800 mg total) by mouth every 8 (eight) hours as needed. 20 tablet 0   Multiple Vitamin (MULTIVITAMIN) tablet Take 1 tablet by mouth daily.     No current facility-administered medications for this visit.     ALLERGIES: Latex  Family History  Problem Relation Age of Onset   Thyroid disease Mother    Carpal tunnel syndrome Mother    Thyroid nodules Mother    Hypertension Father    Diabetes Father    Stroke Father     Social History   Socioeconomic History   Marital status: Single    Spouse name: Not on file   Number of children: 2   Years of education: Not on file   Highest education level: Not on file  Occupational History   Not on file  Tobacco Use   Smoking status: Every Day    Packs/day: .25  Types: Cigarettes   Smokeless tobacco: Never  Vaping Use   Vaping Use: Never used  Substance and Sexual Activity   Alcohol use: Yes    Comment: occ   Drug use: No    Types: Marijuana, Cocaine    Comment: las Cocaine use Nov. 2016, last marijuana 02/22/15   Sexual activity: Not Currently    Birth control/protection: Implant, None  Other Topics Concern   Not on file  Social History Narrative   Not on file   Social Determinants of Health   Financial Resource Strain: Not on file  Food Insecurity: Not on file  Transportation Needs: Not on file  Physical Activity: Not on file  Stress: Not on file  Social Connections: Not on file  Intimate Partner Violence: Not on file    ROS  PHYSICAL EXAMINATION:    There were no vitals taken for this visit.    General appearance: alert, cooperative and appears stated age Neck: no adenopathy, supple, symmetrical, trachea midline and thyroid {CHL AMB PHY EX THYROID NORM [phone]::"normal to inspection and  palpation"} Breasts: {Exam; breast:13139::"normal appearance, no masses or tenderness"} Abdomen: soft, non-tender; non distended, no masses,  no organomegaly  Pelvic: External genitalia:  no lesions              Urethra:  normal appearing urethra with no masses, tenderness or lesions              Bartholins and Skenes: normal                 Vagina: normal appearing vagina with normal color and discharge, no lesions              Cervix: {CHL AMB PHY EX CERVIX NORM DEFAULT:[phone]::"no lesions"}              Bimanual Exam:  Uterus:  {CHL AMB PHY EX UTERUS NORM DEFAULT:[phone]::"normal size, contour, position, consistency, mobility, non-tender"}              Adnexa: {CHL AMB PHY EX ADNEXA NO MASS DEFAULT:[phone]::"no mass, fullness, tenderness"}              Rectovaginal: {yes no:[id number]}.  Confirms.              Anus:  normal sphincter tone, no lesions  Chaperone was present for exam.  ASSESSMENT     PLAN    An After Visit Summary was printed and given to the patient.  *** minutes face to face time of which over 50% was spent in counseling.

## 2022-07-07 ENCOUNTER — Other Ambulatory Visit: Payer: Self-pay | Admitting: Obstetrics and Gynecology

## 2022-07-07 DIAGNOSIS — N939 Abnormal uterine and vaginal bleeding, unspecified: Secondary | ICD-10-CM

## 2022-07-31 ENCOUNTER — Telehealth: Payer: Medicaid Other | Admitting: Family

## 2022-07-31 DIAGNOSIS — R1030 Lower abdominal pain, unspecified: Secondary | ICD-10-CM | POA: Diagnosis not present

## 2022-07-31 DIAGNOSIS — M65341 Trigger finger, right ring finger: Secondary | ICD-10-CM

## 2022-07-31 DIAGNOSIS — M65331 Trigger finger, right middle finger: Secondary | ICD-10-CM | POA: Diagnosis not present

## 2022-07-31 DIAGNOSIS — M79642 Pain in left hand: Secondary | ICD-10-CM

## 2022-07-31 DIAGNOSIS — M79641 Pain in right hand: Secondary | ICD-10-CM

## 2022-07-31 MED ORDER — PREDNISONE 20 MG PO TABS
40.0000 mg | ORAL_TABLET | Freq: Every day | ORAL | 0 refills | Status: AC
Start: 2022-07-31 — End: 2022-08-05

## 2022-07-31 MED ORDER — DICLOFENAC SODIUM 75 MG PO TBEC
75.0000 mg | DELAYED_RELEASE_TABLET | Freq: Two times a day (BID) | ORAL | 0 refills | Status: DC
Start: 2022-07-31 — End: 2022-12-14

## 2022-07-31 MED ORDER — BACLOFEN 10 MG PO TABS
10.0000 mg | ORAL_TABLET | Freq: Three times a day (TID) | ORAL | 0 refills | Status: DC
Start: 2022-07-31 — End: 2022-12-14

## 2022-07-31 NOTE — Progress Notes (Signed)
Virtual Visit Consent   Lisa Holt, you are scheduled for a virtual visit with a  provider today. Just as with appointments in the office, your consent must be obtained to participate. Your consent will be active for this visit and any virtual visit you may have with one of our providers in the next 365 days. If you have a MyChart account, a copy of this consent can be sent to you electronically.  As this is a virtual visit, video technology does not allow for your provider to perform a traditional examination. This may limit your provider's ability to fully assess your condition. If your provider identifies any concerns that need to be evaluated in person or the need to arrange testing (such as labs, EKG, etc.), we will make arrangements to do so. Although advances in technology are sophisticated, we cannot ensure that it will always work on either your end or our end. If the connection with a video visit is poor, the visit may have to be switched to a telephone visit. With either a video or telephone visit, we are not always able to ensure that we have a secure connection.  By engaging in this virtual visit, you consent to the provision of healthcare and authorize for your insurance to be billed (if applicable) for the services provided during this visit. Depending on your insurance coverage, you may receive a charge related to this service.  I need to obtain your verbal consent now. Are you willing to proceed with your visit today? ADELINN GUO has provided verbal consent on 07/31/2022 for a virtual visit (video or telephone). Jannifer Rodney, FNP  Date: 07/31/2022 4:09 PM  Virtual Visit via Video Note   I, Jannifer Rodney, connected with  Lisa Holt  ([id number], September 03, 1990) on 07/31/22 at  4:00 PM EDT by a video-enabled telemedicine application and verified that I am speaking with the correct person using two identifiers.  Location: Patient: Virtual Visit Location Patient:  Home Provider: Virtual Visit Location Provider: Home Office   I discussed the limitations of evaluation and management by telemedicine and the availability of in person appointments. The patient expressed understanding and agreed to proceed.    History of Present Illness: Lisa Holt is a 32 y.o. who identifies as a female who was assigned female at birth, and is being seen today for multiple complaints. She reports she is having bilateral hand pain that is worse in her right hand that started three days ago. Reports "locking" of her middle and ring finger that she will have to "pull" up to get it in place. Reports aching pain of 7 out 10.   Also complaining of right leg pain that started three days ago. Denies any injury. Reports her pain is 9 out 10.   She is taking tylenol with mild relief and using icy hot without relief.   She is also complaining lower abdominal pressure that started today. She is has an appointment with her GYN 08/03/22. HPI: HPI  Problems:  Patient Active Problem List   Diagnosis Date Noted   Bilateral hand pain 01/10/2021   Trigger finger of left hand 01/10/2021   Surveillance of previously prescribed implantable subdermal contraceptive 10/16/2019   Right ovarian cyst 09/18/2018   Chest pain 01/21/2018   Hypokalemia 01/21/2018   Hypomagnesemia 01/21/2018   Influenza due to influenza virus, type B 01/20/2018   HSIL (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 08/06/2015    Allergies:  Allergies  Allergen Reactions   Latex Itching and Swelling   Medications:  Current Outpatient Medications:    baclofen (LIORESAL) 10 MG tablet, Take 1 tablet (10 mg total) by mouth 3 (three) times daily., Disp: 30 each, Rfl: 0   diclofenac (VOLTAREN) 75 MG EC tablet, Take 1 tablet (75 mg total) by mouth 2 (two) times daily., Disp: 30 tablet, Rfl: 0   predniSONE (DELTASONE) 20 MG tablet, Take 2 tablets (40 mg total) by mouth daily with breakfast for 5  days., Disp: 10 tablet, Rfl: 0   acetaminophen (TYLENOL) 325 MG tablet, Take 650 mg by mouth every 6 (six) hours as needed for moderate pain or mild pain., Disp: , Rfl:    ibuprofen (ADVIL) 800 MG tablet, Take 1 tablet (800 mg total) by mouth every 8 (eight) hours as needed., Disp: 20 tablet, Rfl: 0   Multiple Vitamin (MULTIVITAMIN) tablet, Take 1 tablet by mouth daily., Disp: , Rfl:   Observations/Objective: Patient is well-developed, well-nourished in no acute distress.  Resting comfortably in car Head is normocephalic, atraumatic.  No labored breathing.  Speech is clear and coherent with logical content.  Patient is alert and oriented at baseline.    Assessment and Plan: 1. Bilateral hand pain - predniSONE (DELTASONE) 20 MG tablet; Take 2 tablets (40 mg total) by mouth daily with breakfast for 5 days.  Dispense: 10 tablet; Refill: 0 - diclofenac (VOLTAREN) 75 MG EC tablet; Take 1 tablet (75 mg total) by mouth 2 (two) times daily.  Dispense: 30 tablet; Refill: 0 - baclofen (LIORESAL) 10 MG tablet; Take 1 tablet (10 mg total) by mouth 3 (three) times daily.  Dispense: 30 each; Refill: 0  2. Trigger middle finger of right hand - predniSONE (DELTASONE) 20 MG tablet; Take 2 tablets (40 mg total) by mouth daily with breakfast for 5 days.  Dispense: 10 tablet; Refill: 0 - diclofenac (VOLTAREN) 75 MG EC tablet; Take 1 tablet (75 mg total) by mouth 2 (two) times daily.  Dispense: 30 tablet; Refill: 0 - baclofen (LIORESAL) 10 MG tablet; Take 1 tablet (10 mg total) by mouth 3 (three) times daily.  Dispense: 30 each; Refill: 0  3. Trigger ring finger of right hand - predniSONE (DELTASONE) 20 MG tablet; Take 2 tablets (40 mg total) by mouth daily with breakfast for 5 days.  Dispense: 10 tablet; Refill: 0 - diclofenac (VOLTAREN) 75 MG EC tablet; Take 1 tablet (75 mg total) by mouth 2 (two) times daily.  Dispense: 30 tablet; Refill: 0 - baclofen (LIORESAL) 10 MG tablet; Take 1 tablet (10 mg total) by  mouth 3 (three) times daily.  Dispense: 30 each; Refill: 0  4. Lower abdominal pain  Rest ROM exercises  Start diclofenac BID with food, no other NSAID's  Sedation precautions discussed  Needs to be seen in person to be evaluated  Recommend follow up with PCP about joint pain, will more than likely need referral to Ortho  Follow Up Instructions: I discussed the assessment and treatment plan with the patient. The patient was provided an opportunity to ask questions and all were answered. The patient agreed with the plan and demonstrated an understanding of the instructions.  A copy of instructions were sent to the patient via MyChart unless otherwise noted below.    The patient was advised to call back or seek an in-person evaluation if the symptoms worsen or if the condition fails to improve as anticipated.  Time:  I spent 18 minutes with the patient via  telehealth technology discussing the above problems/concerns.    Jannifer Rodney, FNP

## 2022-08-03 ENCOUNTER — Encounter: Payer: Self-pay | Admitting: Obstetrics and Gynecology

## 2022-08-03 ENCOUNTER — Ambulatory Visit (INDEPENDENT_AMBULATORY_CARE_PROVIDER_SITE_OTHER): Payer: Medicaid Other | Admitting: Obstetrics and Gynecology

## 2022-08-03 ENCOUNTER — Other Ambulatory Visit (HOSPITAL_COMMUNITY)
Admission: RE | Admit: 2022-08-03 | Discharge: 2022-08-03 | Disposition: A | Discharge status: Home / Self Care | Payer: Medicaid Other | Source: Ambulatory Visit | Attending: Obstetrics and Gynecology | Admitting: Obstetrics and Gynecology

## 2022-08-03 VITALS — BP 100/60 | HR 100 | Wt 104.0 lb

## 2022-08-03 DIAGNOSIS — A599 Trichomoniasis, unspecified: Secondary | ICD-10-CM

## 2022-08-03 DIAGNOSIS — N898 Other specified noninflammatory disorders of vagina: Secondary | ICD-10-CM

## 2022-08-03 DIAGNOSIS — N939 Abnormal uterine and vaginal bleeding, unspecified: Secondary | ICD-10-CM | POA: Diagnosis not present

## 2022-08-03 DIAGNOSIS — R102 Pelvic and perineal pain: Secondary | ICD-10-CM | POA: Diagnosis not present

## 2022-08-03 DIAGNOSIS — A5901 Trichomonal vulvovaginitis: Secondary | ICD-10-CM

## 2022-08-03 DIAGNOSIS — R87612 Low grade squamous intraepithelial lesion on cytologic smear of cervix (LGSIL): Secondary | ICD-10-CM | POA: Diagnosis not present

## 2022-08-03 DIAGNOSIS — D069 Carcinoma in situ of cervix, unspecified: Secondary | ICD-10-CM

## 2022-08-03 DIAGNOSIS — Z01812 Encounter for preprocedural laboratory examination: Secondary | ICD-10-CM

## 2022-08-03 HISTORY — DX: Trichomoniasis, unspecified: A59.9

## 2022-08-03 HISTORY — DX: Carcinoma in situ of cervix, unspecified: D06.9

## 2022-08-03 LAB — WET PREP FOR TRICH, YEAST, CLUE

## 2022-08-03 LAB — PREGNANCY, URINE: Preg Test, Ur: NEGATIVE

## 2022-08-03 MED ORDER — METRONIDAZOLE 500 MG PO TABS
500.0000 mg | ORAL_TABLET | Freq: Two times a day (BID) | ORAL | 0 refills | Status: DC
Start: 2022-08-03 — End: 2022-11-15

## 2022-08-03 NOTE — Patient Instructions (Addendum)
Medroxyprogesterone Injection (Contraception) What is this medication? MEDROXYPROGESTERONE (me DROX ee proe JES te rone) prevents ovulation and pregnancy. It belongs to a group of medications called contraceptives. This medication is a progestin hormone. This medicine may be used for other purposes; ask your health care provider or pharmacist if you have questions. COMMON BRAND NAME(S): Depo-Provera, Depo-subQ Provera 104 What should I tell my care team before I take this medication? They need to know if you have any of these conditions: Asthma Blood clots Breast cancer or family history of breast cancer Depression Diabetes Eating disorder (anorexia nervosa) Frequently drink alcohol Heart attack High blood pressure HIV infection or AIDS Kidney disease Liver disease Migraine headaches Osteoporosis, weak bones Seizures Stroke Tobacco use Vaginal bleeding An unusual or allergic reaction to medroxyprogesterone, other medications, foods, dyes, or preservatives Pregnant or trying to get pregnant Breast-feeding How should I use this medication? Depo-Provera CI contraceptive injection is given into a muscle. Depo-subQ Provera 104 injection is given under the skin. It is given in a hospital or clinic setting. The injection is usually given during the first 5 days after the start of a menstrual period or 6 weeks after delivery of a baby. A patient package insert for the product will be given with each prescription and refill. Be sure to read this information carefully each time. The sheet may change often. Talk to your care team about the use of this medication in children. Special care may be needed. These injections have been used in female children who have started having menstrual periods. Overdosage: If you think you have taken too much of this medicine contact a poison control center or emergency room at once. NOTE: This medicine is only for you. Do not share this medicine with  others. What if I miss a dose? Keep appointments for follow-up doses. You must get an injection once every 3 months. It is important not to miss your dose. Call your care team if you are unable to keep an appointment. What may interact with this medication? Antibiotics or medications for infections, especially rifampin and griseofulvin Antivirals for HIV or hepatitis Aprepitant Armodafinil Bexarotene Bosentan Medications for seizures, such as carbamazepine, felbamate, oxcarbazepine, phenytoin, phenobarbital, primidone, topiramate Mitotane Modafinil St. John's Wort This list may not describe all possible interactions. Give your health care provider a list of all the medicines, herbs, non-prescription drugs, or dietary supplements you use. Also tell them if you smoke, drink alcohol, or use illegal drugs. Some items may interact with your medicine. What should I watch for while using this medication? This medication does not protect you against HIV infection (AIDS) or other sexually transmitted diseases. Use of this product may cause you to lose calcium from your bones. Loss of calcium may cause weak bones (osteoporosis). Only use this product for more than 2 years if other forms of birth control are not right for you. The longer you use this product for birth control the more likely you will be at risk for weak bones. Ask your care team how you can keep strong bones. You may have a change in bleeding pattern or irregular periods. Many females stop having periods while taking this medication. If you have received your injections on time, your chance of being pregnant is very low. If you think you may be pregnant, see your care team as soon as possible. Tell your care team if you want to get pregnant within the next year. The effect of this medication may last a long time   after you get your last injection. What side effects may I notice from receiving this medication? Side effects that you should  report to your care team as soon as possible: Allergic reactions--skin rash, itching, hives, swelling of the face, lips, tongue, or throat Blood clot--pain, swelling, or warmth in the leg, shortness of breath, chest pain Gallbladder problems--severe stomach pain, nausea, vomiting, fever Increase in blood pressure Liver injury--right upper belly pain, loss of appetite, nausea, light-colored stool, dark yellow or brown urine, yellowing skin or eyes, unusual weakness or fatigue New or worsening migraines or headaches Seizures Stroke--sudden numbness or weakness of the face, arm, or leg, trouble speaking, confusion, trouble walking, loss of balance or coordination, dizziness, severe headache, change in vision Unusual vaginal discharge, itching, or odor Worsening mood, feelings of depression Side effects that usually do not require medical attention (report to your care team if they continue or are bothersome): Breast pain or tenderness Dark patches of the skin on the face or other sun-exposed areas Irregular menstrual cycles or spotting Nausea Weight gain This list may not describe all possible side effects. Call your doctor for medical advice about side effects. You may report side effects to FDA at 1-800-FDA-1088. Where should I keep my medication? This injection is only given by a care team. It will not be stored at home. NOTE: This sheet is a summary. It may not cover all possible information. If you have questions about this medicine, talk to your doctor, pharmacist, or health care provider.  2024 Elsevier/Gold Standard (2020-10-21 00:00:00)   Trichomoniasis Trichomoniasis is a sexually transmitted infection (STI). Many people with trichomoniasis do not have any symptoms (are asymptomatic) or have only minimal symptoms. Untreated trichomoniasis can last from months to years. This condition is treated with medicine. What are the causes? This condition is caused by a parasite called  Trichomonas vaginalis and is transmitted during sexual contact. What increases the risk? The following factors may make you more likely to develop this condition: Having unprotected sex. Having sex with a partner who has trichomoniasis. Having multiple sexual partners. Having had previous trichomoniasis infections or other STIs. What are the signs or symptoms? In females, symptoms of trichomoniasis include: Itching, burning, redness, or soreness in the genital area. Discomfort while urinating. Abnormal vaginal discharge that is clear, white, gray, or yellow-green and foamy and has an unusual fishy odor. In males, symptoms of trichomoniasis include: Discharge from the penis. Burning after urination or ejaculation. Itching or discomfort inside the penis. How is this diagnosed? This condition is diagnosed based on tests. To perform a test, your health care provider will do one of the following: Ask you to provide a urine sample. Take a sample of discharge. The sample may be taken from the vagina or cervix in females and from the urethra in males. Your health care provider may use a swab to collect the sample. Your health care provider may test you for other STIs, including human immunodeficiency virus (HIV). How is this treated?  This condition is treated with medicines such as metronidazole or tinidazole. These are called antimicrobial medicines, and they are taken by mouth (orally). Your sexual partner or partners also need to be tested and treated. If they have the infection and are not treated, you will likely get reinfected. If you plan to become pregnant or think you may be pregnant, tell your health care provider right away. Some medicines that are used to treat the infection should not be taken during pregnancy. Your health care  provider may recommend over-the-counter medicines or creams to help relieve itching or irritation. You may be tested for the infection again 3 months after  treatment. Follow these instructions at home: Medicines Take over-the-counter and prescription medicines only as told by your health care provider. Take your antimicrobial medicine as told by your health care provider. Do not stop taking it even if you start to feel better. Use creams as told by your health care provider. General instructions Do not have sex until after you finish your medicine and your symptoms have resolved. Do not wear tampons while you have the infection (if you are female). Talk with your sexual partner or partners about any symptoms that either of you may have, as well as any history of STIs. Keep all follow-up visits. This is important. How is this prevented?  Use condoms every time you have sex. Using condoms correctly and consistently can help protect against STIs. Do not have sexual contact if you have symptoms of trichomoniasis or another STI. Avoid having multiple sexual partners. Get tested for STIs before you have sex with a partner. Ask all partners to do the same. Do not douche (if you are female). Douching may increase your risk for getting STIs due to the removal of good bacteria in the vagina. Contact a health care provider if: You still have symptoms after you finish your medicine. You develop a rash. You plan to become pregnant or think you may be pregnant. Summary Trichomoniasis is a sexually transmitted infection (STI). This condition often has no symptoms or minimal symptoms. Take your antimicrobial medicine as told by your health care provider. Do not stop taking even if you start to feel better. Discuss your infection with your sexual partner or partners. Make sure that all partners get tested and treated, if necessary. You should not have sex until after you finish your medicine and your symptoms have resolved. Keep all follow-up visits. This is important. This information is not intended to replace advice given to you by your health care  provider. Make sure you discuss any questions you have with your health care provider. Document Revised: 01/06/2021 Document Reviewed: 01/06/2021 Elsevier Patient Education  2024 [employer].

## 2022-08-03 NOTE — Progress Notes (Signed)
GYNECOLOGY  VISIT   HPI: 32 y.o.   Single   Black or African American Other or two or more races Unavailable Not Hispanic or Latino  female   [phone] with Patient's last menstrual period was 07/18/2022 (approximate).   here for  a colposcopy for an abnormal pap and test of cure for trich. Pap from 04/05/22 returned as LSIL/+HPV/+trich. She was treated with Flagyl.    She had her deeply placed nexplanon removed in the OR in 3/24, it had been in place since 2017.   She c/o a several month h/o severe lower abdominal cramping, causes nausea, can make her dizzy. Up to a 10/10 in severity. Can last for 1 hour to all day. No association to eating or BM. Normal BM q 1-2 days. Bladder feels fuller when she has the pain, no other urinary c/o. She can go up to a month without the pain and then it returns. No related to her cycles.  Negative GC/CT in 2/24. Last had the pain over the weekend, now it's gone.  This is the same intermittent pain she has been having since prior to her annual exam in 2/24. No current pain, prior pelvic exam was normal.  She hasn't been sexually active since her last visit (when she was treated for trich) but missed some doses of her Flagyl.   Cycles are 1-2 x a month x 7-14 days. Just had nexplanon removed in 3/24. Gets bad cramps with her cycles. She wants to be on Depo-provera for cycle control.   She would like to talk about starting depo.   GYNECOLOGIC HISTORY: Patient's last menstrual period was 07/18/2022 (approximate). Contraception: none  Menopausal hormone therapy: none         OB History     Gravida  2   Para  2   Term  2   Preterm      AB      Living  2      SAB      IAB      Ectopic      Multiple  0   Live Births  2              Patient Active Problem List   Diagnosis Date Noted   Bilateral hand pain 01/10/2021   Trigger finger of left hand 01/10/2021   Surveillance of previously prescribed implantable subdermal contraceptive  10/16/2019   Right ovarian cyst 09/18/2018   Chest pain 01/21/2018   Hypokalemia 01/21/2018   Hypomagnesemia 01/21/2018   Influenza due to influenza virus, type B 01/20/2018   HSIL (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 08/06/2015    Past Medical History:  Diagnosis Date   Allergic rhinitis    Anemia    Anorexia    Anxiety    GERD (gastroesophageal reflux disease)    Migraine with aura    Ovarian cyst    Post partum depression    was prescribed meds, took 2, then stopped   Thyroglossal cyst    Vitamin D deficiency     Past Surgical History:  Procedure Laterality Date   FOREIGN BODY REMOVAL Left 05/17/2022   Procedure: EXCISION FOREIGN BODY LEFT ARM;  Surgeon: Sheliah Hatch, De Blanch, MD;  Location: [facility];  Service: General;  Laterality: Left;  LMA   WISDOM TOOTH EXTRACTION      Current Outpatient Medications  Medication Sig Dispense Refill   acetaminophen (TYLENOL) 325 MG tablet Take 650 mg by  mouth every 6 (six) hours as needed for moderate pain or mild pain.     baclofen (LIORESAL) 10 MG tablet Take 1 tablet (10 mg total) by mouth 3 (three) times daily. 30 each 0   diclofenac (VOLTAREN) 75 MG EC tablet Take 1 tablet (75 mg total) by mouth 2 (two) times daily. 30 tablet 0   ferrous sulfate 325 (65 FE) MG EC tablet Take 325 mg by mouth.     ibuprofen (ADVIL) 800 MG tablet Take 1 tablet (800 mg total) by mouth every 8 (eight) hours as needed. 20 tablet 0   Multiple Vitamin (MULTIVITAMIN) tablet Take 1 tablet by mouth daily.     predniSONE (DELTASONE) 20 MG tablet Take 2 tablets (40 mg total) by mouth daily with breakfast for 5 days. 10 tablet 0   No current facility-administered medications for this visit.     ALLERGIES: Latex  Family History  Problem Relation Age of Onset   Thyroid disease Mother    Carpal tunnel syndrome Mother    Thyroid nodules Mother    Hypertension Father    Diabetes Father    Stroke Father      Social History   Socioeconomic History   Marital status: Single    Spouse name: Not on file   Number of children: 2   Years of education: Not on file   Highest education level: Not on file  Occupational History   Not on file  Tobacco Use   Smoking status: Every Day    Packs/day: .25    Types: Cigarettes   Smokeless tobacco: Never  Vaping Use   Vaping Use: Never used  Substance and Sexual Activity   Alcohol use: Yes    Comment: occ   Drug use: No    Types: Marijuana, Cocaine    Comment: las Cocaine use Nov. 2016, last marijuana 02/22/15   Sexual activity: Not Currently    Birth control/protection: Implant, None  Other Topics Concern   Not on file  Social History Narrative   Not on file   Social Determinants of Health   Financial Resource Strain: Not on file  Food Insecurity: Not on file  Transportation Needs: Not on file  Physical Activity: Not on file  Stress: Not on file  Social Connections: Not on file  Intimate Partner Violence: Not on file    Review of Systems  All other systems reviewed and are negative.   PHYSICAL EXAMINATION:    BP 100/60   Pulse 100   Wt 104 lb (47.2 kg)   LMP 07/18/2022 (Approximate)   SpO2 100%   BMI 18.42 kg/m     General appearance: alert, cooperative and appears stated age  Pelvic: External genitalia:  no lesions              Urethra:  normal appearing urethra with no masses, tenderness or lesions              Bartholins and Skenes: normal                 Vagina: normal appearing vagina with an increase in thin, white, foamy vaginal d/c              Cervix:  no gross lesions                Colposcopy: Unsatisfactory, mild aceto-white changes, biopsy taken at 8 o'clock. ECC done, hemostasis obtained with silver nitrate. Negative lugols examination of the upper vagina.   Chaperone was  present for exam.  1. LGSIL on Pap smear of cervix - Surgical pathology( [place]/ [facility])  2. Vaginal discharge H/O trich,  only partially treated - WET PREP FOR TRICH, YEAST, CLUE  3. Trichomonal vaginitis - metroNIDAZOLE (FLAGYL) 500 MG tablet; Take 1 tablet (500 mg total) by mouth 2 (two) times daily.  Dispense: 14 tablet; Refill: 0 -Needs test of cure in 6-12 weeks -Information given  4. Pelvic pain Intermittent severe pain. Prior normal exam, prior negative GC/CT testing - US PELVIS TRANSVAGINAL NON-OB (TV ONLY); Future  5. Abnormal uterine bleeding (AUB) Nexplanon recently removed (was in for 7 years). Recent normal TSH - US PELVIS TRANSVAGINAL NON-OB (TV ONLY); Future -If normal, she would like to start depo-provera. Reviewed risks and side effects, information given  6. Pre-procedure lab exam - Pregnancy, urine

## 2022-08-05 ENCOUNTER — Other Ambulatory Visit (HOSPITAL_COMMUNITY): Payer: Self-pay

## 2022-08-05 ENCOUNTER — Telehealth: Payer: Self-pay

## 2022-08-05 NOTE — Telephone Encounter (Signed)
Approval letter in media

## 2022-08-05 NOTE — Telephone Encounter (Signed)
Patient Advocate Encounter   Received notification from [facility] that prior authorization is required for Diclofenac Sodium 75MG  dr tablets   Submitted: 08-05-2022 Key [id number]  Status is pending

## 2022-08-08 LAB — SURGICAL PATHOLOGY

## 2022-08-09 ENCOUNTER — Other Ambulatory Visit: Payer: Medicaid Other | Admitting: Obstetrics and Gynecology

## 2022-08-09 ENCOUNTER — Other Ambulatory Visit: Payer: Medicaid Other

## 2022-08-09 NOTE — Progress Notes (Deleted)
GYNECOLOGY  VISIT   HPI: 32 y.o.   Single   Black or African American Other or two or more races Unavailable Not Hispanic or Latino  female   [phone] with Patient's last menstrual period was 07/18/2022 (approximate).   here for evaluation of pelvic pain and AUB.     She has had a negative UPT last week, negative GC/CT testing in 2/24. Normal TSH in 2/24.  She was treated for trich last week. Also diagnosed with CIN 3 and will be set up for a leep.   She is interested in depo-provera for contraception.   GYNECOLOGIC HISTORY: Patient's last menstrual period was 07/18/2022 (approximate). Contraception:*** Menopausal hormone therapy: ***        OB History     Gravida  2   Para  2   Term  2   Preterm      AB      Living  2      SAB      IAB      Ectopic      Multiple  0   Live Births  2              Patient Active Problem List   Diagnosis Date Noted   Bilateral hand pain 01/10/2021   Trigger finger of left hand 01/10/2021   Surveillance of previously prescribed implantable subdermal contraceptive 10/16/2019   Right ovarian cyst 09/18/2018   Chest pain 01/21/2018   Hypokalemia 01/21/2018   Hypomagnesemia 01/21/2018   Influenza due to influenza virus, type B 01/20/2018   HSIL (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 08/06/2015    Past Medical History:  Diagnosis Date   Allergic rhinitis    Anemia    Anorexia    Anxiety    GERD (gastroesophageal reflux disease)    Migraine with aura    Ovarian cyst    Post partum depression    was prescribed meds, took 2, then stopped   Thyroglossal cyst    Vitamin D deficiency     Past Surgical History:  Procedure Laterality Date   FOREIGN BODY REMOVAL Left 05/17/2022   Procedure: EXCISION FOREIGN BODY LEFT ARM;  Surgeon: Rodman Pickle, MD;  Location: [facility];  Service: General;  Laterality: Left;  LMA   WISDOM TOOTH EXTRACTION      Current Outpatient  Medications  Medication Sig Dispense Refill   acetaminophen (TYLENOL) 325 MG tablet Take 650 mg by mouth every 6 (six) hours as needed for moderate pain or mild pain.     baclofen (LIORESAL) 10 MG tablet Take 1 tablet (10 mg total) by mouth 3 (three) times daily. 30 each 0   diclofenac (VOLTAREN) 75 MG EC tablet Take 1 tablet (75 mg total) by mouth 2 (two) times daily. 30 tablet 0   ferrous sulfate 325 (65 FE) MG EC tablet Take 325 mg by mouth.     ibuprofen (ADVIL) 800 MG tablet Take 1 tablet (800 mg total) by mouth every 8 (eight) hours as needed. 20 tablet 0   metroNIDAZOLE (FLAGYL) 500 MG tablet Take 1 tablet (500 mg total) by mouth 2 (two) times daily. 14 tablet 0   Multiple Vitamin (MULTIVITAMIN) tablet Take 1 tablet by mouth daily.     No current facility-administered medications for this visit.     ALLERGIES: Latex  Family History  Problem Relation Age of Onset   Thyroid disease Mother    Carpal tunnel syndrome Mother  Thyroid nodules Mother    Hypertension Father    Diabetes Father    Stroke Father     Social History   Socioeconomic History   Marital status: Single    Spouse name: Not on file   Number of children: 2   Years of education: Not on file   Highest education level: Not on file  Occupational History   Not on file  Tobacco Use   Smoking status: Every Day    Packs/day: .25    Types: Cigarettes   Smokeless tobacco: Never  Vaping Use   Vaping Use: Never used  Substance and Sexual Activity   Alcohol use: Yes    Comment: occ   Drug use: No    Types: Marijuana, Cocaine    Comment: las Cocaine use Nov. 2016, last marijuana 02/22/15   Sexual activity: Not Currently    Birth control/protection: Implant, None  Other Topics Concern   Not on file  Social History Narrative   Not on file   Social Determinants of Health   Financial Resource Strain: Not on file  Food Insecurity: Not on file  Transportation Needs: Not on file  Physical Activity: Not on  file  Stress: Not on file  Social Connections: Not on file  Intimate Partner Violence: Not on file    ROS  PHYSICAL EXAMINATION:    LMP 07/18/2022 (Approximate)     General appearance: alert, cooperative and appears stated age Neck: no adenopathy, supple, symmetrical, trachea midline and thyroid {CHL AMB PHY EX THYROID NORM DEFAULT:[id number]::"normal to inspection and palpation"} Breasts: {Exam; breast:13139::"normal appearance, no masses or tenderness"} Abdomen: soft, non-tender; non distended, no masses,  no organomegaly  Pelvic: External genitalia:  no lesions              Urethra:  normal appearing urethra with no masses, tenderness or lesions              Bartholins and Skenes: normal                 Vagina: normal appearing vagina with normal color and discharge, no lesions              Cervix: {CHL AMB PHY EX CERVIX NORM [phone]::"no lesions"}              Bimanual Exam:  Uterus:  {CHL AMB PHY EX UTERUS NORM DEFAULT:[phone]::"normal size, contour, position, consistency, mobility, non-tender"}              Adnexa: {CHL AMB PHY EX ADNEXA NO MASS DEFAULT:[phone]::"no mass, fullness, tenderness"}              Rectovaginal: {yes no:314532}.  Confirms.              Anus:  normal sphincter tone, no lesions  Chaperone was present for exam.  ASSESSMENT     PLAN    An After Visit Summary was printed and given to the patient.  *** minutes face to face time of which over 50% was spent in counseling.

## 2022-08-11 ENCOUNTER — Other Ambulatory Visit: Payer: Self-pay

## 2022-08-11 DIAGNOSIS — D069 Carcinoma in situ of cervix, unspecified: Secondary | ICD-10-CM

## 2022-09-05 ENCOUNTER — Encounter: Payer: Medicaid Other | Admitting: Obstetrics and Gynecology

## 2022-09-06 NOTE — Progress Notes (Deleted)
GYNECOLOGY  VISIT   HPI: 32 y.o.   Single  African American  female   [phone] with No LMP recorded. (Menstrual status: Irregular Periods).   here for   LEEP  GYNECOLOGIC HISTORY: No LMP recorded. (Menstrual status: Irregular Periods). Contraception:  n/a Menopausal hormone therapy:  n/a Last mammogram:  n/a Last pap smear:   04/05/22 LSIL: HR HPV positive, 1 /11/17 neg        OB History     Gravida  2   Para  2   Term  2   Preterm      AB      Living  2      SAB      IAB      Ectopic      Multiple  0   Live Births  2              Patient Active Problem List   Diagnosis Date Noted   Bilateral hand pain 01/10/2021   Trigger finger of left hand 01/10/2021   Surveillance of previously prescribed implantable subdermal contraceptive 10/16/2019   Right ovarian cyst 09/18/2018   Chest pain 01/21/2018   Hypokalemia 01/21/2018   Hypomagnesemia 01/21/2018   Influenza due to influenza virus, type B 01/20/2018   HSIL (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 08/06/2015    Past Medical History:  Diagnosis Date   Allergic rhinitis    Anemia    Anorexia    Anxiety    GERD (gastroesophageal reflux disease)    Migraine with aura    Ovarian cyst    Post partum depression    was prescribed meds, took 2, then stopped   Thyroglossal cyst    Vitamin D deficiency     Past Surgical History:  Procedure Laterality Date   FOREIGN BODY REMOVAL Left 05/17/2022   Procedure: EXCISION FOREIGN BODY LEFT ARM;  Surgeon: Rodman Pickle, MD;  Location: [facility];  Service: General;  Laterality: Left;  LMA   WISDOM TOOTH EXTRACTION      Current Outpatient Medications  Medication Sig Dispense Refill   acetaminophen (TYLENOL) 325 MG tablet Take 650 mg by mouth every 6 (six) hours as needed for moderate pain or mild pain.     baclofen (LIORESAL) 10 MG tablet Take 1 tablet (10 mg total) by mouth 3 (three) times daily. 30 each 0    diclofenac (VOLTAREN) 75 MG EC tablet Take 1 tablet (75 mg total) by mouth 2 (two) times daily. 30 tablet 0   ferrous sulfate 325 (65 FE) MG EC tablet Take 325 mg by mouth.     ibuprofen (ADVIL) 800 MG tablet Take 1 tablet (800 mg total) by mouth every 8 (eight) hours as needed. 20 tablet 0   metroNIDAZOLE (FLAGYL) 500 MG tablet Take 1 tablet (500 mg total) by mouth 2 (two) times daily. 14 tablet 0   Multiple Vitamin (MULTIVITAMIN) tablet Take 1 tablet by mouth daily.     No current facility-administered medications for this visit.     ALLERGIES: Latex  Family History  Problem Relation Age of Onset   Thyroid disease Mother    Carpal tunnel syndrome Mother    Thyroid nodules Mother    Hypertension Father    Diabetes Father    Stroke Father     Social History   Socioeconomic History   Marital status: Single    Spouse name: Not on file   Number of children:  2   Years of education: Not on file   Highest education level: Not on file  Occupational History   Not on file  Tobacco Use   Smoking status: Every Day    Current packs/day: 0.25    Types: Cigarettes   Smokeless tobacco: Never  Vaping Use   Vaping status: Never Used  Substance and Sexual Activity   Alcohol use: Yes    Comment: occ   Drug use: No    Types: Marijuana, Cocaine    Comment: las Cocaine use Nov. 2016, last marijuana 02/22/15   Sexual activity: Not Currently    Birth control/protection: Implant, None  Other Topics Concern   Not on file  Social History Narrative   Not on file   Social Determinants of Health   Financial Resource Strain: Not on file  Food Insecurity: Not on file  Transportation Needs: Not on file  Physical Activity: Not on file  Stress: Not on file  Social Connections: Unknown (07/06/2021)   [facility]   Social Network    Social Network: Not on file  Intimate Partner Violence: Unknown (05/26/2021)   Received from [facility]   HITS    Physically Hurt: Not on  file    Insult or Talk Down To: Not on file    Threaten Physical Harm: Not on file    Scream or Curse: Not on file    Review of Systems  PHYSICAL EXAMINATION:    There were no vitals taken for this visit.    General appearance: alert, cooperative and appears stated age Head: Normocephalic, without obvious abnormality, atraumatic Neck: no adenopathy, supple, symmetrical, trachea midline and thyroid normal to inspection and palpation Lungs: clear to auscultation bilaterally Breasts: normal appearance, no masses or tenderness, No nipple retraction or dimpling, No nipple discharge or bleeding, No axillary or supraclavicular adenopathy Heart: regular rate and rhythm Abdomen: soft, non-tender, no masses,  no organomegaly Extremities: extremities normal, atraumatic, no cyanosis or edema Skin: Skin color, texture, turgor normal. No rashes or lesions Lymph nodes: Cervical, supraclavicular, and axillary nodes normal. No abnormal inguinal nodes palpated Neurologic: Grossly normal  Pelvic: External genitalia:  no lesions              Urethra:  normal appearing urethra with no masses, tenderness or lesions              Bartholins and Skenes: normal                 Vagina: normal appearing vagina with normal color and discharge, no lesions              Cervix: no lesions                Bimanual Exam:  Uterus:  normal size, contour, position, consistency, mobility, non-tender              Adnexa: no mass, fullness, tenderness              Rectal exam: {yes no:314532}.  Confirms.              Anus:  normal sphincter tone, no lesions  Chaperone was present for exam:  ***  ASSESSMENT     PLAN     An After Visit Summary was printed and given to the patient.  ______ minutes face to face time of which over 50% was spent in counseling.

## 2022-09-07 ENCOUNTER — Ambulatory Visit: Payer: Medicaid Other | Admitting: Radiology

## 2022-09-09 NOTE — Progress Notes (Deleted)
GYNECOLOGY  VISIT   HPI: 32 y.o.   Single  African American  female   [phone] with No LMP recorded. (Menstrual status: Irregular Periods).   here for   U/S consult  GYNECOLOGIC HISTORY: No LMP recorded. (Menstrual status: Irregular Periods). Contraception:  implant Menopausal hormone therapy:  n/a Last mammogram:  n/a Last pap smear:   04/05/22 LSIL: HR HPV positive        OB History     Gravida  2   Para  2   Term  2   Preterm      AB      Living  2      SAB      IAB      Ectopic      Multiple  0   Live Births  2              Patient Active Problem List   Diagnosis Date Noted   Bilateral hand pain 01/10/2021   Trigger finger of left hand 01/10/2021   Surveillance of previously prescribed implantable subdermal contraceptive 10/16/2019   Right ovarian cyst 09/18/2018   Chest pain 01/21/2018   Hypokalemia 01/21/2018   Hypomagnesemia 01/21/2018   Influenza due to influenza virus, type B 01/20/2018   HSIL (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 08/06/2015    Past Medical History:  Diagnosis Date   Allergic rhinitis    Anemia    Anorexia    Anxiety    GERD (gastroesophageal reflux disease)    Migraine with aura    Ovarian cyst    Post partum depression    was prescribed meds, took 2, then stopped   Thyroglossal cyst    Vitamin D deficiency     Past Surgical History:  Procedure Laterality Date   FOREIGN BODY REMOVAL Left 05/17/2022   Procedure: EXCISION FOREIGN BODY LEFT ARM;  Surgeon: Rodman Pickle, MD;  Location: [facility];  Service: General;  Laterality: Left;  LMA   WISDOM TOOTH EXTRACTION      Current Outpatient Medications  Medication Sig Dispense Refill   acetaminophen (TYLENOL) 325 MG tablet Take 650 mg by mouth every 6 (six) hours as needed for moderate pain or mild pain.     baclofen (LIORESAL) 10 MG tablet Take 1 tablet (10 mg total) by mouth 3 (three) times daily. 30 each 0    diclofenac (VOLTAREN) 75 MG EC tablet Take 1 tablet (75 mg total) by mouth 2 (two) times daily. 30 tablet 0   ferrous sulfate 325 (65 FE) MG EC tablet Take 325 mg by mouth.     ibuprofen (ADVIL) 800 MG tablet Take 1 tablet (800 mg total) by mouth every 8 (eight) hours as needed. 20 tablet 0   metroNIDAZOLE (FLAGYL) 500 MG tablet Take 1 tablet (500 mg total) by mouth 2 (two) times daily. 14 tablet 0   Multiple Vitamin (MULTIVITAMIN) tablet Take 1 tablet by mouth daily.     No current facility-administered medications for this visit.     ALLERGIES: Latex  Family History  Problem Relation Age of Onset   Thyroid disease Mother    Carpal tunnel syndrome Mother    Thyroid nodules Mother    Hypertension Father    Diabetes Father    Stroke Father     Social History   Socioeconomic History   Marital status: Single    Spouse name: Not on file   Number of children: 2  Years of education: Not on file   Highest education level: Not on file  Occupational History   Not on file  Tobacco Use   Smoking status: Every Day    Current packs/day: 0.25    Types: Cigarettes   Smokeless tobacco: Never  Vaping Use   Vaping status: Never Used  Substance and Sexual Activity   Alcohol use: Yes    Comment: occ   Drug use: No    Types: Marijuana, Cocaine    Comment: las Cocaine use Nov. 2016, last marijuana 02/22/15   Sexual activity: Not Currently    Birth control/protection: Implant, None  Other Topics Concern   Not on file  Social History Narrative   Not on file   Social Determinants of Health   Financial Resource Strain: Not on file  Food Insecurity: Not on file  Transportation Needs: Not on file  Physical Activity: Not on file  Stress: Not on file  Social Connections: Unknown (07/06/2021)   Received from [facility]   Social Network    Social Network: Not on file  Intimate Partner Violence: Unknown (05/26/2021)   Received from [facility]   HITS    Physically Hurt: Not on file     Insult or Talk Down To: Not on file    Threaten Physical Harm: Not on file    Scream or Curse: Not on file    Review of Systems  PHYSICAL EXAMINATION:    There were no vitals taken for this visit.    General appearance: alert, cooperative and appears stated age Head: Normocephalic, without obvious abnormality, atraumatic Neck: no adenopathy, supple, symmetrical, trachea midline and thyroid normal to inspection and palpation Lungs: clear to auscultation bilaterally Breasts: normal appearance, no masses or tenderness, No nipple retraction or dimpling, No nipple discharge or bleeding, No axillary or supraclavicular adenopathy Heart: regular rate and rhythm Abdomen: soft, non-tender, no masses,  no organomegaly Extremities: extremities normal, atraumatic, no cyanosis or edema Skin: Skin color, texture, turgor normal. No rashes or lesions Lymph nodes: Cervical, supraclavicular, and axillary nodes normal. No abnormal inguinal nodes palpated Neurologic: Grossly normal  Pelvic: External genitalia:  no lesions              Urethra:  normal appearing urethra with no masses, tenderness or lesions              Bartholins and Skenes: normal                 Vagina: normal appearing vagina with normal color and discharge, no lesions              Cervix: no lesions                Bimanual Exam:  Uterus:  normal size, contour, position, consistency, mobility, non-tender              Adnexa: no mass, fullness, tenderness              Rectal exam: {yes no:314532}.  Confirms.              Anus:  normal sphincter tone, no lesions  Chaperone was present for exam:  ***  ASSESSMENT     PLAN     An After Visit Summary was printed and given to the patient.  ______ minutes face to face time of which over 50% was spent in counseling.

## 2022-09-12 ENCOUNTER — Encounter: Payer: Medicaid Other | Admitting: Obstetrics and Gynecology

## 2022-09-12 ENCOUNTER — Telehealth: Payer: Self-pay

## 2022-09-12 NOTE — Telephone Encounter (Signed)
Left message to call Jill, RN at [facility], [phone], option 5.  

## 2022-09-12 NOTE — Telephone Encounter (Signed)
Patient was to come in for LEEP procedure for today 09/12/22. Nurse informed front desk that patient will have to have a TOC office visit before having LEEP and it will need to be rescheduled. Call placed to patient to inform her before she came in that we would be doing a TOC before procedure can be done. Patient stated that she "is at work and wants a call back after 3pm to reschedule her surgery for the cancer". I told patient that it was not a surgery scheduled for today but an in-office procedure. Patient stated again that she was at work and needed to be called back after 3pm.    Routing to Armed forces technical officer.

## 2022-09-14 NOTE — Telephone Encounter (Signed)
I can do test of cure for trichomonas if the patient does not have gel used at the ultrasound visit.  This would need to be communicated to the ultrasound technician.  If he patient declines the pelvic ultrasound, the test of cure for the trichomonas still needs to be done prior the the LEEP procedure.   Patient needs to be aware that the LEEP needs to be rescheduled as a priority. 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  The standard of care is to remove the precancer cells in order to reduce her risk of future cervical cancer.

## 2022-09-14 NOTE — Telephone Encounter (Signed)
Left message to call Noreene Larsson, RN at [facility], [phone], option 5.  Per review of EPIC, patient is scheduled for PUS and consult with Dr. Edward Jolly on 09/20/22. 8/6 with [facility] for TOC and 9/12 for 3 mo f/u with JC.   Dr. Edward Jolly -can TOC be completed at this visit and discussion of LEEP procedure if no return call?

## 2022-09-15 NOTE — Telephone Encounter (Signed)
Note added to appt desk for sonographer.

## 2022-09-19 ENCOUNTER — Telehealth: Payer: Medicaid Other | Admitting: Physician Assistant

## 2022-09-19 DIAGNOSIS — R6889 Other general symptoms and signs: Secondary | ICD-10-CM | POA: Diagnosis not present

## 2022-09-19 NOTE — Progress Notes (Signed)
I have spent 5 minutes in review of e-visit questionnaire, review and updating patient chart, medical decision making and response to patient.   William Cody Martin, PA-C    

## 2022-09-19 NOTE — Progress Notes (Signed)
E visit for Flu like symptoms   We are sorry that you are not feeling well.  Here is how we plan to help! Based on what you have shared with me it looks like you may have flu-like symptoms that should be watched but do not seem to indicate anti-viral treatment.  Influenza or "the flu" is   an infection caused by a respiratory virus. The flu virus is highly contagious and persons who did not receive their yearly flu vaccination may "catch" the flu from close contact.  We have anti-viral medications to treat the viruses that cause this infection. They are not a "cure" and only shorten the course of the infection. These prescriptions are most effective when they are given within the first 2 days of "flu" symptoms. Antiviral medication are indicated if you have a high risk of complications from the flu. You should  also consider an antiviral medication if you are in close contact with someone who is at risk. These medications can help patients avoid complications from the flu  but have side effects that you should know. Possible side effects from Tamiflu or oseltamivir include nausea, vomiting, diarrhea, dizziness, headaches, eye redness, sleep problems or other respiratory symptoms. You should not take Tamiflu if you have an allergy to oseltamivir or any to the ingredients in Tamiflu.  Please keep well-hydrated and try to get plenty of rest. If you have a humidifier, place it in the bedroom and run it at night. Start a saline nasal rinse for nasal congestion. You can consider use of a nasal steroid spray like Flonase or Nasacort OTC. You can alternate between Tylenol and Ibuprofen if needed for fever, body aches, headache and/or throat pain. Salt water-gargles and chloraseptic spray can be very beneficial for sore throat. Mucinex-DM for congestion or cough. Please take all prescribed medications as directed.  Remain out of work until [employer] for 24 hours without a fever-reducing medication, and you  are feeling better.  You should mask until symptoms are resolved.  If anything worsens despite treatment, you need to be evaluated in-person. Please do not delay care.   ANYONE WHO HAS FLU SYMPTOMS SHOULD: Stay home. The flu is highly contagious and going out or to work exposes others! Be sure to drink plenty of fluids. Water is fine as well as fruit juices, sodas and electrolyte beverages. You may want to stay away from caffeine or alcohol. If you are nauseated, try taking small sips of liquids. How do you know if you are getting enough fluid? Your urine should be a pale yellow or almost colorless. Get rest. Taking a steamy shower or using a humidifier may help nasal congestion and ease sore throat pain. Using a saline nasal spray works much the same way. Cough drops, hard candies and sore throat lozenges may ease your cough. Line up a caregiver. Have someone check on you regularly.   GET HELP RIGHT AWAY IF: You cannot keep down liquids or your medications. You become short of breath Your fell like you are going to pass out or loose consciousness. Your symptoms persist after you have completed your treatment plan MAKE SURE YOU  Understand these instructions. Will watch your condition. Will get help right away if you are not doing well or get worse.  Your e-visit answers were reviewed by a board certified advanced clinical practitioner to complete your personal care plan.  Depending on the condition, your plan could have included both over the counter or prescription medications.  If  there is a problem please reply  once you have received a response from your provider.  Your safety is important to [place].  If you have drug allergies check your prescription carefully.    You can use MyChart to ask questions about today's visit, request a non-urgent call back, or ask for a work or school excuse for 24 hours related to this e-Visit. If it has been greater than 24 hours you will need to follow  up with your provider, or enter a new e-Visit to address those concerns.  You will get an e-mail in the next two days asking about your experience.  I hope that your e-visit has been valuable and will speed your recovery. Thank you for using e-visits.

## 2022-09-19 NOTE — Progress Notes (Signed)
Message sent to patient requesting further input regarding current symptoms. Awaiting patient response.  

## 2022-09-20 ENCOUNTER — Encounter (HOSPITAL_COMMUNITY): Payer: Self-pay

## 2022-09-20 ENCOUNTER — Other Ambulatory Visit: Payer: Medicaid Other

## 2022-09-20 ENCOUNTER — Ambulatory Visit (HOSPITAL_COMMUNITY)
Admission: EM | Admit: 2022-09-20 | Discharge: 2022-09-20 | Disposition: A | Discharge status: Home / Self Care | Payer: Medicaid Other | Attending: Internal Medicine | Admitting: Internal Medicine

## 2022-09-20 DIAGNOSIS — Z1152 Encounter for screening for COVID-19: Secondary | ICD-10-CM | POA: Insufficient documentation

## 2022-09-20 DIAGNOSIS — R110 Nausea: Secondary | ICD-10-CM | POA: Diagnosis not present

## 2022-09-20 DIAGNOSIS — J209 Acute bronchitis, unspecified: Secondary | ICD-10-CM | POA: Diagnosis not present

## 2022-09-20 DIAGNOSIS — R059 Cough, unspecified: Secondary | ICD-10-CM | POA: Diagnosis present

## 2022-09-20 DIAGNOSIS — F17210 Nicotine dependence, cigarettes, uncomplicated: Secondary | ICD-10-CM | POA: Diagnosis not present

## 2022-09-20 MED ORDER — ONDANSETRON 4 MG PO TBDP
ORAL_TABLET | ORAL | Status: AC
  Filled 2022-09-20: qty 1

## 2022-09-20 MED ORDER — ONDANSETRON 4 MG PO TBDP
4.0000 mg | ORAL_TABLET | Freq: Once | ORAL | Status: AC
  Administered 2022-09-20: 4 mg via ORAL

## 2022-09-20 MED ORDER — BENZONATATE 100 MG PO CAPS: 100.0000 mg | ORAL_CAPSULE | Freq: Three times a day (TID) | ORAL | 0 refills | Status: DC

## 2022-09-20 MED ORDER — PREDNISONE 20 MG PO TABS: 40.0000 mg | ORAL_TABLET | Freq: Every day | ORAL | 0 refills | Status: AC

## 2022-09-20 MED ORDER — ALBUTEROL SULFATE HFA 108 (90 BASE) MCG/ACT IN AERS: 1.0000 | INHALATION_SPRAY | Freq: Four times a day (QID) | RESPIRATORY_TRACT | 0 refills | Status: DC | PRN

## 2022-09-20 NOTE — ED Provider Notes (Signed)
[facility]    CSN: [id number] Arrival date & time: 09/20/22  1930      History   Chief Complaint Chief Complaint  Patient presents with   Cough    HPI Lisa Holt is a 32 y.o. female.   Patient presents to urgent care for evaluation of nausea and vomiting 3 days ago. Symptoms improved for 24 hours, then returned with added cough, chills, nasal congestion, body aches, sore throat, and generalized fatigue. She believes her initial N/V may have been related to intake of food outside of normal diet. She has not had any emesis in the last 3 days but remains nauseated. Cough is mostly dry and non-productive. Sore throat worsens with cough. 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 Reports intermittent shortness of breath with coughing, no CP or heart palpitations. Current everyday smoker, denies other drug use. Denies known COVID-19 infection in the last 90 days. No history of chronic respiratory problems. Using OTC mucinex, tylenol, and motrin as needed without much relief.    Cough   Past Medical History:  Diagnosis Date   Allergic rhinitis    Anemia    Anorexia    Anxiety    GERD (gastroesophageal reflux disease)    Migraine with aura    Ovarian cyst    Post partum depression    was prescribed meds, took 2, then stopped   Thyroglossal cyst    Vitamin D deficiency     Patient Active Problem List   Diagnosis Date Noted   Bilateral hand pain 01/10/2021   Trigger finger of left hand 01/10/2021   Surveillance of previously prescribed implantable subdermal contraceptive 10/16/2019   Right ovarian cyst 09/18/2018   Chest pain 01/21/2018   Hypokalemia 01/21/2018   Hypomagnesemia 01/21/2018   Influenza due to influenza virus, type B 01/20/2018   HSIL (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 08/06/2015    Past Surgical History:  Procedure Laterality Date    FOREIGN BODY REMOVAL Left 05/17/2022   Procedure: EXCISION FOREIGN BODY LEFT ARM;  Surgeon: Sheliah Hatch, De Blanch, MD;  Location: [facility] [place];  Service: General;  Laterality: Left;  LMA   WISDOM TOOTH EXTRACTION      OB History     Gravida  2   Para  2   Term  2   Preterm      AB      Living  2      SAB      IAB      Ectopic      Multiple  0   Live Births  2            Home Medications    Prior to Admission medications   Medication Sig Start Date End Date Taking? Authorizing Provider  albuterol (VENTOLIN HFA) 108 (90 Base) MCG/ACT inhaler Inhale 1-2 puffs into the lungs every 6 (six) hours as needed for wheezing or shortness of breath. 09/20/22  Yes Carlisle Beers, FNP  benzonatate (TESSALON) 100 MG capsule Take 1 capsule (100 mg total) by mouth every 8 (eight) hours. 09/20/22  Yes Carlisle Beers, FNP  predniSONE (DELTASONE) 20 MG tablet Take 2 tablets (40 mg total) by mouth daily for 5 days. 09/20/22 09/25/22 Yes StanhopeDonavan Burnet, FNP  acetaminophen (TYLENOL) 325 MG tablet Take 650 mg by mouth every 6 (six) hours as needed for moderate pain  or mild pain.    [provider]  baclofen (LIORESAL) 10 MG tablet Take 1 tablet (10 mg total) by mouth 3 (three) times daily. 07/31/22   Junie Spencer, FNP  diclofenac (VOLTAREN) 75 MG EC tablet Take 1 tablet (75 mg total) by mouth 2 (two) times daily. 07/31/22   Jannifer Rodney A, FNP  ferrous sulfate 325 (65 FE) MG EC tablet Take 325 mg by mouth.    [provider]  ibuprofen (ADVIL) 800 MG tablet Take 1 tablet (800 mg total) by mouth every 8 (eight) hours as needed. 06/26/22   Claiborne Rigg, NP  metroNIDAZOLE (FLAGYL) 500 MG tablet Take 1 tablet (500 mg total) by mouth 2 (two) times daily. 08/03/22   Romualdo Bolk, MD  Multiple Vitamin (MULTIVITAMIN) tablet Take 1 tablet by mouth daily.    [provider]    Family History Family History  Problem Relation Age  of Onset   Thyroid disease Mother    Carpal tunnel syndrome Mother    Thyroid nodules Mother    Hypertension Father    Diabetes Father    Stroke Father     Social History Social History   Tobacco Use   Smoking status: Every Day    Current packs/day: 0.25    Types: Cigarettes   Smokeless tobacco: Never  Vaping Use   Vaping status: Never Used  Substance Use Topics   Alcohol use: Yes    Comment: occ   Drug use: No    Types: Marijuana, Cocaine    Comment: las Cocaine use Nov. 2016, last marijuana 02/22/15     Allergies   Latex   Review of Systems Review of Systems  Respiratory:  Positive for cough.   Per HPI   Physical Exam Triage Vital Signs ED Triage Vitals  Encounter Vitals Group     BP 09/20/22 2002 98/67     Systolic BP Percentile --      Diastolic BP Percentile --      Pulse Rate 09/20/22 2002 85     Resp 09/20/22 2002 19     Temp 09/20/22 2002 98.7 F (37.1 C)     Temp src --      SpO2 09/20/22 2002 97 %     Weight --      Height --      Head Circumference --      Peak Flow --      Pain Score 09/20/22 2001 8     Pain Loc --      Pain Education --      Exclude from Growth Chart --    No data found.  Updated Vital Signs BP 98/67   Pulse 85   Temp 98.7 F (37.1 C)   Resp 19   LMP 09/13/2022   SpO2 97%   Visual Acuity Right Eye Distance:   Left Eye Distance:   Bilateral Distance:    Right Eye Near:   Left Eye Near:    Bilateral Near:     Physical Exam Vitals and nursing note reviewed.  Constitutional:      Appearance: She is ill-appearing. She is not toxic-appearing.  HENT:     Head: Normocephalic and atraumatic.     Right Ear: Hearing, tympanic membrane, ear canal and external ear normal.     Left Ear: Hearing, tympanic membrane, ear canal and external ear normal.     Nose: Nose normal.     Mouth/Throat:  Lips: Pink.     Mouth: Mucous membranes are moist. No injury.     Tongue: No lesions. Tongue does not deviate from  midline.     Palate: No mass and lesions.     Pharynx: Oropharynx is clear. Uvula midline. No pharyngeal swelling, oropharyngeal exudate, posterior oropharyngeal erythema or uvula swelling.     Tonsils: No tonsillar exudate or tonsillar abscesses.  Eyes:     General: Lids are normal. Vision grossly intact. Gaze aligned appropriately.     Extraocular Movements: Extraocular movements intact.     Conjunctiva/sclera: Conjunctivae normal.  Cardiovascular:     Rate and Rhythm: Normal rate and regular rhythm.     Heart sounds: Normal heart sounds, S1 normal and S2 normal.  Pulmonary:     Effort: Pulmonary effort is normal. No respiratory distress.     Breath sounds: Normal air entry. Wheezing (faint expiratory wheeze to the bilateral lower lung fields) and rhonchi (diffuse with cough) present. No rales.  Chest:     Chest wall: No tenderness.  Abdominal:     General: Abdomen is flat. Bowel sounds are normal.     Palpations: Abdomen is soft.     Tenderness: There is no abdominal tenderness. There is no right CVA tenderness, left CVA tenderness or guarding.  Musculoskeletal:     Cervical back: Neck supple.     Right lower leg: No edema.     Left lower leg: No edema.  Skin:    General: Skin is warm and dry.     Capillary Refill: Capillary refill takes less than 2 seconds.     Findings: No rash.  Neurological:     General: No focal deficit present.     Mental Status: She is alert and oriented to person, place, and time. Mental status is at baseline.     Cranial Nerves: No dysarthria or facial asymmetry.  Psychiatric:        Mood and Affect: Mood normal.        Speech: Speech normal.        Behavior: Behavior normal.        Thought Content: Thought content normal.        Judgment: Judgment normal.      UC Treatments / Results  Labs (all labs ordered are listed, but only abnormal results are displayed) Labs Reviewed  SARS CORONAVIRUS 2 (TAT 6-24 HRS)    EKG   Radiology No  results found.  Procedures Procedures (including critical care time)  Medications Ordered in UC Medications  ondansetron (ZOFRAN-ODT) disintegrating tablet 4 mg (4 mg Oral Given 09/20/22 2018)    Initial Impression / Assessment and Plan / UC Course  I have reviewed the triage vital signs and the nursing notes.  Pertinent labs & imaging results that were available during my care of the patient were reviewed by me and considered in my medical decision making (see chart for details).   1. Acute bronchitis, cigarette nicotine dependence Evaluation suggests viral  bronchitis etiology. Will manage this with recommendations for OTC and prescription medications for symptomatic relief. Albuterol inhaler, prednisone burst, and tessalon perles sent. No NSAIDs with steroid.Encouraged to push fluids to stay well hydrated.   Imaging: deferred based on stable cardiopulmonary exam/hemodynamically stable vital signs  Viral testing: COVID-19 testing is pending, patient is not a candidate for antiviral therapy.   Medications given in clinic: zofran 4mg  ODT for nausea with some relief  Counseled patient on potential for adverse effects with medications prescribed/recommended  today, strict ER and return-to-clinic precautions discussed, patient verbalized understanding.    Final Clinical Impressions(s) / UC Diagnoses   Final diagnoses:  Acute bronchitis, unspecified organism  Cigarette nicotine dependence without complication     Discharge Instructions      You have bronchitis which is inflammation of the upper airways in your lungs due to a virus. The following medicines will help with your symptoms.   - Take steroid pills sent to pharmacy as directed. Do not take any other NSAID containing medications such as ibuprofen or naproxen/Aleve while taking prednisone. - You may use albuterol inhaler 1 to 2 puffs every 4-6 hours as needed for cough, shortness of breath, and wheezing. - Take cough  medicines as prescribed.  - Continue using over the counter medicines as needed as directed. Plain mucinex (guaifenesin) over the counter may further help breakup mucus and help with symptoms.   If you develop any new or worsening symptoms or do not improve in the next 2 to 3 days, please return.  If your symptoms are severe, please go to the emergency room. Follow-up with PCP as needed.     ED Prescriptions     Medication Sig Dispense Auth. Provider   benzonatate (TESSALON) 100 MG capsule Take 1 capsule (100 mg total) by mouth every 8 (eight) hours. 21 capsule Reita May M, FNP   predniSONE (DELTASONE) 20 MG tablet Take 2 tablets (40 mg total) by mouth daily for 5 days. 10 tablet Carlisle Beers, FNP   albuterol (VENTOLIN HFA) 108 (90 Base) MCG/ACT inhaler Inhale 1-2 puffs into the lungs every 6 (six) hours as needed for wheezing or shortness of breath. 8 g Carlisle Beers, FNP      PDMP not reviewed this encounter.   Carlisle Beers, [place] 09/20/22 2159

## 2022-09-20 NOTE — Discharge Instructions (Signed)
You have bronchitis which is inflammation of the upper airways in your lungs due to a virus. The following medicines will help with your symptoms.   - Take steroid pills sent to pharmacy as directed. Do not take any other NSAID containing medications such as ibuprofen or naproxen/Aleve while taking prednisone. - You may use albuterol inhaler 1 to 2 puffs every 4-6 hours as needed for cough, shortness of breath, and wheezing. - Take cough medicines as prescribed.  - Continue using over the counter medicines as needed as directed. Plain mucinex (guaifenesin) over the counter may further help breakup mucus and help with symptoms.   If you develop any new or worsening symptoms or do not improve in the next 2 to 3 days, please return.  If your symptoms are severe, please go to the emergency room. Follow-up with PCP as needed.

## 2022-09-20 NOTE — ED Triage Notes (Signed)
Pt is recently getting over a stomach virus.   Now she has developed cough and congestion x 3 days. Stomach steel feels like its in knots.

## 2022-09-22 ENCOUNTER — Other Ambulatory Visit: Payer: Medicaid Other

## 2022-09-22 ENCOUNTER — Other Ambulatory Visit: Payer: Medicaid Other | Admitting: Radiology

## 2022-09-27 ENCOUNTER — Ambulatory Visit: Payer: Medicaid Other | Admitting: Radiology

## 2022-09-29 ENCOUNTER — Other Ambulatory Visit: Payer: Medicaid Other

## 2022-09-29 ENCOUNTER — Other Ambulatory Visit: Payer: Medicaid Other | Admitting: Radiology

## 2022-10-07 ENCOUNTER — Encounter (HOSPITAL_COMMUNITY): Payer: Self-pay

## 2022-10-11 ENCOUNTER — Ambulatory Visit (HOSPITAL_COMMUNITY)
Admission: EM | Admit: 2022-10-11 | Discharge: 2022-10-11 | Disposition: A | Discharge status: Home / Self Care | Payer: Medicaid Other | Attending: Family Medicine | Admitting: Family Medicine

## 2022-10-11 ENCOUNTER — Encounter (HOSPITAL_COMMUNITY): Payer: Self-pay

## 2022-10-11 DIAGNOSIS — J069 Acute upper respiratory infection, unspecified: Secondary | ICD-10-CM | POA: Diagnosis not present

## 2022-10-11 DIAGNOSIS — M5431 Sciatica, right side: Secondary | ICD-10-CM | POA: Diagnosis present

## 2022-10-11 DIAGNOSIS — E860 Dehydration: Secondary | ICD-10-CM | POA: Diagnosis not present

## 2022-10-11 DIAGNOSIS — U071 COVID-19: Secondary | ICD-10-CM | POA: Insufficient documentation

## 2022-10-11 DIAGNOSIS — R52 Pain, unspecified: Secondary | ICD-10-CM | POA: Diagnosis present

## 2022-10-11 DIAGNOSIS — R6883 Chills (without fever): Secondary | ICD-10-CM | POA: Diagnosis present

## 2022-10-11 LAB — POCT URINALYSIS DIP (MANUAL ENTRY)
Blood, UA: NEGATIVE
Glucose, UA: NEGATIVE mg/dL
Ketones, POC UA: NEGATIVE mg/dL
Leukocytes, UA: NEGATIVE
Nitrite, UA: NEGATIVE
Protein Ur, POC: 30 mg/dL — AB
Spec Grav, UA: >=1.03 — AB
Urobilinogen, UA: 0.2 U/dL
pH, UA: 6

## 2022-10-11 LAB — SARS CORONAVIRUS 2 (TAT 6-24 HRS): SARS Coronavirus 2: POSITIVE — AB

## 2022-10-11 LAB — POCT FASTING CBG KUC MANUAL ENTRY: POCT Glucose (KUC): 92 mg/dL (ref 70–99)

## 2022-10-11 LAB — POCT URINE PREGNANCY: Preg Test, Ur: NEGATIVE

## 2022-10-11 MED ORDER — ONDANSETRON 4 MG PO TBDP
ORAL_TABLET | ORAL | Status: AC
  Filled 2022-10-11: qty 1

## 2022-10-11 MED ORDER — KETOROLAC TROMETHAMINE 60 MG/2ML IM SOLN
INTRAMUSCULAR | Status: AC
  Filled 2022-10-11: qty 2

## 2022-10-11 MED ORDER — KETOROLAC TROMETHAMINE 60 MG/2ML IM SOLN
60.0000 mg | Freq: Once | INTRAMUSCULAR | Status: AC
  Administered 2022-10-11: 60 mg via INTRAMUSCULAR

## 2022-10-11 MED ORDER — ONDANSETRON 4 MG PO TBDP
4.0000 mg | ORAL_TABLET | Freq: Once | ORAL | Status: AC
  Administered 2022-10-11: 4 mg via ORAL

## 2022-10-11 MED ORDER — SODIUM CHLORIDE 0.9 % IV BOLUS
1000.0000 mL | Freq: Once | INTRAVENOUS | Status: AC
  Administered 2022-10-11: 1000 mL via INTRAVENOUS

## 2022-10-11 MED ORDER — ONDANSETRON HCL 4 MG/2ML IJ SOLN
4.0000 mg | Freq: Once | INTRAMUSCULAR | Status: AC
  Administered 2022-10-11: 4 mg via INTRAVENOUS

## 2022-10-11 MED ORDER — PREDNISONE 20 MG PO TABS: 40.0000 mg | ORAL_TABLET | Freq: Every day | ORAL | 0 refills | Status: DC

## 2022-10-11 MED ORDER — ONDANSETRON HCL 4 MG/2ML IJ SOLN
INTRAMUSCULAR | Status: AC
  Filled 2022-10-11: qty 2

## 2022-10-11 NOTE — Discharge Instructions (Addendum)
You have been tested for COVID-19 today. °If your test returns positive, you will receive a phone call from [facility] regarding your results. °Negative test results are not called. °Both positive and negative results area always visible on MyChart. °If you do not have a MyChart account, sign up instructions are provided in your discharge papers. °Please do not hesitate to contact us should you have questions or concerns. ° °

## 2022-10-11 NOTE — ED Triage Notes (Addendum)
Pt states body ache and chills started x 3 days ago.Pt reports bilateral leg pain and backpain. Denies any recent trauma or falls.

## 2022-10-15 NOTE — ED Provider Notes (Signed)
[facility]   [id number] 10/11/22 Arrival Time: 1114  ASSESSMENT & PLAN:  1. Sciatica of right side   2. Viral URI with cough   3. Dehydration    Feeling better after IVF. Discussed typical duration of likely viral illness with flare in sciatica. OTC symptom care as needed.  For sciatica, trial of: Discharge Medication List as of 10/11/2022  4:03 PM     START taking these medications   Details  predniSONE (DELTASONE) 20 MG tablet Take 2 tablets (40 mg total) by mouth daily., Starting Tue 10/11/2022, Normal       May f/u as needed.  Results for orders placed or performed during the hospital encounter of 10/11/22  SARS CORONAVIRUS 2 (TAT 6-24 HRS) Anterior Nasal Swab   Specimen: Anterior Nasal Swab  Result Value Ref Range   SARS Coronavirus 2 POSITIVE (A) NEGATIVE  POC urinalysis dipstick  Result Value Ref Range   Color, UA yellow yellow   Clarity, UA clear clear   Glucose, UA negative negative mg/dL   Bilirubin, UA small (A) negative   Ketones, POC UA negative negative mg/dL   Spec Grav, UA >=1.914 (A) 1.010 - 1.025   Blood, UA negative negative   pH, UA 6.0 5.0 - 8.0   Protein Ur, POC =30 (A) negative mg/dL   Urobilinogen, UA 0.2 0.2 or 1.0 E.U./dL   Nitrite, UA Negative Negative   Leukocytes, UA Negative Negative  POCT urine pregnancy  Result Value Ref Range   Preg Test, Ur Negative Negative  POC CBG monitoring  Result Value Ref Range   POCT Glucose ([facility]) 92 70 - 99 mg/dL    Reviewed expectations re: course of current medical issues. Questions answered. Outlined signs and symptoms indicating need for more acute intervention. Understanding verbalized. After Visit Summary given.   SUBJECTIVE: History from: Patient. Lisa Holt is a 32 y.o. female. Reports: Pt states body ache and chills started x 3 days ago.Pt reports bilateral leg pain and backpain. Denies any recent trauma or falls.  Denies: fever. Normal bowel/bladder habits. With  emesis. Patient's last menstrual period was 09/13/2022.   OBJECTIVE:  Vitals:   10/11/22 1143 10/11/22 1449 10/11/22 1525  BP: 101/64 (!) 88/58 (!) 89/48  Pulse: 74 64   Resp: 16 16 16   Temp: 98.7 F (37.1 C)    TempSrc: Oral    SpO2: 97% 99% 99%    General appearance: alert; no distress Eyes: PERRLA; EOMI; conjunctiva normal HENT: [place]; AT; with nasal congestion; dry mucous membranes Neck: supple  Lungs: speaks full sentences without difficulty; unlabored; dry cough Back: benign exam; mild soreness on left lower Extremities: no edema Skin: warm and dry Neurologic: normal gait Psychological: alert and cooperative; normal mood and affect  Labs: Results for orders placed or performed during the hospital encounter of 10/11/22  SARS CORONAVIRUS 2 (TAT 6-24 HRS) Anterior Nasal Swab   Specimen: Anterior Nasal Swab  Result Value Ref Range   SARS Coronavirus 2 POSITIVE (A) NEGATIVE  POC urinalysis dipstick  Result Value Ref Range   Color, UA yellow yellow   Clarity, UA clear clear   Glucose, UA negative negative mg/dL   Bilirubin, UA small (A) negative   Ketones, POC UA negative negative mg/dL   Spec Grav, UA >=7.829 (A) 1.010 - 1.025   Blood, UA negative negative   pH, UA 6.0 5.0 - 8.0   Protein Ur, POC =30 (A) negative mg/dL   Urobilinogen, UA 0.2 0.2 or 1.0 E.U./dL  Nitrite, UA Negative Negative   Leukocytes, UA Negative Negative  POCT urine pregnancy  Result Value Ref Range   Preg Test, Ur Negative Negative  POC CBG monitoring  Result Value Ref Range   POCT Glucose ([facility]) 92 70 - 99 mg/dL   Labs Reviewed  SARS CORONAVIRUS 2 (TAT 6-24 HRS) - Abnormal; Notable for the following components:      Result Value   SARS Coronavirus 2 POSITIVE (*)    All other components within normal limits  POCT URINALYSIS DIP (MANUAL ENTRY) - Abnormal; Notable for the following components:   Bilirubin, UA small (*)    Spec Grav, UA >=1.030 (*)    Protein Ur, POC =30 (*)    All other  components within normal limits  POCT URINE PREGNANCY  POCT FASTING CBG [facility] MANUAL ENTRY    Imaging: No results found.  Allergies  Allergen Reactions   Latex Itching and Swelling    Past Medical History:  Diagnosis Date   Allergic rhinitis    Anemia    Anorexia    Anxiety    GERD (gastroesophageal reflux disease)    Migraine with aura    Ovarian cyst    Post partum depression    was prescribed meds, took 2, then stopped   Thyroglossal cyst    Vitamin D deficiency    Social History   Socioeconomic History   Marital status: Single    Spouse name: Not on file   Number of children: 2   Years of education: Not on file   Highest education level: Not on file  Occupational History   Not on file  Tobacco Use   Smoking status: Every Day    Current packs/day: 0.25    Types: Cigarettes   Smokeless tobacco: Never  Vaping Use   Vaping status: Never Used  Substance and Sexual Activity   Alcohol use: Yes    Comment: occ   Drug use: No    Types: Marijuana, Cocaine    Comment: las Cocaine use Nov. 2016, last marijuana 02/22/15   Sexual activity: Not Currently    Birth control/protection: Implant, None  Other Topics Concern   Not on file  Social History Narrative   Not on file   Social Determinants of Health   Financial Resource Strain: Not on file  Food Insecurity: Not on file  Transportation Needs: Not on file  Physical Activity: Not on file  Stress: Not on file  Social Connections: Unknown (07/06/2021)   Received from [facility]   Social Network    Social Network: Not on file  Intimate Partner Violence: Unknown (05/26/2021)   Received from [facility]   HITS    Physically Hurt: Not on file    Insult or Talk Down To: Not on file    Threaten Physical Harm: Not on file    Scream or Curse: Not on file   Family History  Problem Relation Age of Onset   Thyroid disease Mother    Carpal tunnel syndrome Mother    Thyroid nodules Mother    Hypertension Father     Diabetes Father    Stroke Father    Past Surgical History:  Procedure Laterality Date   FOREIGN BODY REMOVAL Left 05/17/2022   Procedure: EXCISION FOREIGN BODY LEFT ARM;  Surgeon: Sheliah Hatch, De Blanch, MD;  Location: [facility];  Service: General;  Laterality: Left;  LMA   WISDOM TOOTH EXTRACTION       Mardella Layman, MD 10/15/22  1011  

## 2022-10-24 ENCOUNTER — Telehealth: Payer: Medicaid Other | Admitting: Physician Assistant

## 2022-10-24 DIAGNOSIS — R630 Anorexia: Secondary | ICD-10-CM

## 2022-10-24 DIAGNOSIS — M5441 Lumbago with sciatica, right side: Secondary | ICD-10-CM

## 2022-10-24 DIAGNOSIS — R531 Weakness: Secondary | ICD-10-CM

## 2022-10-24 NOTE — Progress Notes (Signed)
   Thank you for the details you included in the comment boxes. Those details are very helpful in determining the best course of treatment for you and help [place] to provide the best care. Because of having weakness and loss of appetite with back pain, we recommend that you convert this visit to a video visit in order for the provider to better assess what is going on.  The provider will be able to give you a more accurate diagnosis and treatment plan if we can more freely discuss your symptoms and with the addition of a virtual examination.   If you convert to a video visit, we will bill your insurance (similar to an office visit) and you will not be charged for this e-Visit. You will be able to stay at home and speak with the first available [facility] advanced practice provider. The link to do a video visit is in the drop down Menu tab of your Welcome screen in MyChart.

## 2022-11-03 ENCOUNTER — Ambulatory Visit: Payer: Medicaid Other | Admitting: Radiology

## 2022-11-11 ENCOUNTER — Ambulatory Visit: Payer: Medicaid Other | Admitting: Radiology

## 2022-11-14 NOTE — Telephone Encounter (Signed)
Call returned to patient.  Patient states 2 of the Flagyl tablets got wet, was unable to complete medication. Requesting additional Rx prior to next visit.    Patient states she has not been sexually active since last treatment. Patient states she did not finish medication the first time and testing was still positive when repeated.   Reviewed need for LEEP procedure. Colpo completed 08/03/22. Patient states she has discussed time off with employer and states she has mead getting this completed a priority.   Advised I will review with Dr. Edward Jolly and f/u with recommendations. Pharmacy updated.   Dr. Edward Jolly -please review.

## 2022-11-15 MED ORDER — METRONIDAZOLE 500 MG PO TABS
500.0000 mg | ORAL_TABLET | Freq: Two times a day (BID) | ORAL | 0 refills | Status: AC
Start: 2022-11-15 — End: 2022-11-22

## 2022-11-15 NOTE — Telephone Encounter (Signed)
Ok for Flagyl 500 mg po bid x 7 days.   Repeat testing for trichomonas should be done no sooner than three weeks following treatment.   Her current test of cure appointment should be rescheduled to meet this guideline.   If her test is negative, then her LEEP should be scheduled with me.

## 2022-11-15 NOTE — Telephone Encounter (Signed)
Spoke with patient, advised as seen below per Dr. Edward Jolly. OV rescheduled to 10/18 at 1100 with JC. Advised patient of importance of completing TOC in order to proceed with LEEP procedure . Patient verbalizes understanding and is agreeable.  Rx to verified pharmacy.   Encounter closed.

## 2022-11-23 ENCOUNTER — Ambulatory Visit: Payer: Medicaid Other | Admitting: Radiology

## 2022-12-09 ENCOUNTER — Ambulatory Visit: Payer: Medicaid Other | Admitting: Radiology

## 2022-12-14 ENCOUNTER — Ambulatory Visit (HOSPITAL_COMMUNITY)
Admission: EM | Admit: 2022-12-14 | Discharge: 2022-12-14 | Disposition: A | Discharge status: Home / Self Care | Payer: Medicaid Other | Attending: Family Medicine | Admitting: Family Medicine

## 2022-12-14 ENCOUNTER — Encounter (HOSPITAL_COMMUNITY): Payer: Self-pay | Admitting: Emergency Medicine

## 2022-12-14 ENCOUNTER — Telehealth (HOSPITAL_COMMUNITY): Payer: Self-pay | Admitting: Emergency Medicine

## 2022-12-14 DIAGNOSIS — M79604 Pain in right leg: Secondary | ICD-10-CM

## 2022-12-14 DIAGNOSIS — R1110 Vomiting, unspecified: Secondary | ICD-10-CM | POA: Diagnosis not present

## 2022-12-14 DIAGNOSIS — L723 Sebaceous cyst: Secondary | ICD-10-CM | POA: Diagnosis not present

## 2022-12-14 LAB — POCT URINE PREGNANCY: Preg Test, Ur: NEGATIVE

## 2022-12-14 MED ORDER — ONDANSETRON 4 MG PO TBDP
ORAL_TABLET | ORAL | Status: AC
  Filled 2022-12-14: qty 1

## 2022-12-14 MED ORDER — KETOROLAC TROMETHAMINE 60 MG/2ML IM SOLN
60.0000 mg | Freq: Once | INTRAMUSCULAR | Status: AC
  Administered 2022-12-14: 60 mg via INTRAMUSCULAR

## 2022-12-14 MED ORDER — KETOROLAC TROMETHAMINE 60 MG/2ML IM SOLN
INTRAMUSCULAR | Status: AC
  Filled 2022-12-14: qty 2

## 2022-12-14 MED ORDER — ONDANSETRON 4 MG PO TBDP
4.0000 mg | ORAL_TABLET | Freq: Once | ORAL | Status: AC
  Administered 2022-12-14: 4 mg via ORAL

## 2022-12-14 MED ORDER — DOXYCYCLINE HYCLATE 100 MG PO CAPS: 100.0000 mg | ORAL_CAPSULE | Freq: Two times a day (BID) | ORAL | 0 refills | Status: DC

## 2022-12-14 MED ORDER — ONDANSETRON 4 MG PO TBDP: 4.0000 mg | ORAL_TABLET | Freq: Three times a day (TID) | ORAL | 0 refills | Status: DC | PRN

## 2022-12-14 NOTE — Discharge Instructions (Addendum)
Meds ordered this encounter  Medications   ondansetron (ZOFRAN-ODT) disintegrating tablet 4 mg   ketorolac (TORADOL) injection 60 mg   ondansetron (ZOFRAN-ODT) 4 MG disintegrating tablet    Sig: Take 1 tablet (4 mg total) by mouth every 8 (eight) hours as needed for nausea or vomiting.    Dispense:  15 tablet    Refill:  0   doxycycline (VIBRAMYCIN) 100 MG capsule    Sig: Take 1 capsule (100 mg total) by mouth 2 (two) times daily.    Dispense:  14 capsule    Refill:  0

## 2022-12-14 NOTE — Telephone Encounter (Signed)
Created in error

## 2022-12-14 NOTE — ED Triage Notes (Signed)
Pt c/o vomiting and headache since this morning. She thinks headaches may be related to a cyst she has behind her left ear. She also has had back and leg pain due to sciatic pain.

## 2022-12-15 NOTE — ED Provider Notes (Signed)
[facility]   [id number] 12/14/22 Arrival Time: 1836  ASSESSMENT & PLAN:  1. Vomiting, unspecified vomiting type, unspecified whether nausea present   2. Right leg pain   3. Sebaceous cyst of ear    Tolerating sips of PO fluids. Sebaceous cyst of ear likely infected.  Meds ordered this encounter  Medications   ondansetron (ZOFRAN-ODT) disintegrating tablet 4 mg   ketorolac (TORADOL) injection 60 mg   ondansetron (ZOFRAN-ODT) 4 MG disintegrating tablet    Sig: Take 1 tablet (4 mg total) by mouth every 8 (eight) hours as needed for nausea or vomiting.    Dispense:  15 tablet    Refill:  0   doxycycline (VIBRAMYCIN) 100 MG capsule    Sig: Take 1 capsule (100 mg total) by mouth 2 (two) times daily.    Dispense:  14 capsule    Refill:  0   Overall suspect viral illness if making her feel bad. Son with similar recently.  Otherwise she will f/u with her PCP or here if not showing improvement over the next 48-72 hours.  Reviewed expectations re: course of current medical issues. Questions answered. Outlined signs and symptoms indicating need for more acute intervention. Patient verbalized understanding. After Visit Summary given.   SUBJECTIVE: History from: patient.  Lisa Holt is a 32 y.o. female who c/o vomiting and headache since this morning. Body aches; with nasal congestion. Son sick with similar recently. She thinks headaches may be related to a cyst she has behind her left ear. Recurring cyst. She also has had back and leg pain due to sciatic pain.  Denies fever.  Patient's last menstrual period was 12/11/2022 (approximate).  Past Surgical History:  Procedure Laterality Date   FOREIGN BODY REMOVAL Left 05/17/2022   Procedure: EXCISION FOREIGN BODY LEFT ARM;  Surgeon: Kinsinger, De Blanch, MD;  Location: [facility];  Service: General;  Laterality: Left;  LMA   WISDOM TOOTH EXTRACTION      OBJECTIVE:  Vitals:   12/14/22 1930  BP:  96/69  Pulse: (!) 104  Resp: 17  Temp: 99.1 F (37.3 C)  TempSrc: Oral  SpO2: 94%    Slight tachycardia noted. General appearance: alert; no distress but appears fatigued Oropharynx: moist Lungs: clear to auscultation bilaterally; unlabored Heart: regular rate and rhythm Abdomen: soft; non-distended; no significant abdominal tenderness; bowel sounds present; no masses or organomegaly; no guarding or rebound tenderness Back: no CVA tenderness Extremities: no edema; symmetrical with no gross deformities; reports pain with R SLR Skin: warm; dry; sebaceous cyst behind R ear with overlying erythema; without drainage or bleeding Neurologic: normal gait Psychological: alert and cooperative; normal mood and affect  Labs: Results for orders placed or performed during the hospital encounter of 12/14/22  POCT urine pregnancy  Result Value Ref Range   Preg Test, Ur Negative Negative   Labs Reviewed  POCT URINE PREGNANCY      Allergies  Allergen Reactions   Latex Itching and Swelling                                               Past Medical History:  Diagnosis Date   Allergic rhinitis    Anemia    Anorexia    Anxiety    GERD (gastroesophageal reflux disease)    Migraine with aura    Ovarian cyst  Post partum depression    was prescribed meds, took 2, then stopped   Thyroglossal cyst    Vitamin D deficiency    Social History   Socioeconomic History   Marital status: Single    Spouse name: Not on file   Number of children: 2   Years of education: Not on file   Highest education level: Not on file  Occupational History   Not on file  Tobacco Use   Smoking status: Every Day    Current packs/day: 0.25    Types: Cigarettes   Smokeless tobacco: Never  Vaping Use   Vaping status: Never Used  Substance and Sexual Activity   Alcohol use: Yes    Comment: occ   Drug use: No    Types: Marijuana, Cocaine    Comment: las Cocaine use Nov. 2016, last marijuana 02/22/15    Sexual activity: Not Currently    Birth control/protection: Implant, None  Other Topics Concern   Not on file  Social History Narrative   Not on file   Social Determinants of Health   Financial Resource Strain: Not on file  Food Insecurity: Not on file  Transportation Needs: Not on file  Physical Activity: Not on file  Stress: Not on file  Social Connections: Unknown (07/06/2021)   Received from [facility]   Social Network    Social Network: Not on file  Intimate Partner Violence: Unknown (05/26/2021)   Received from [facility], [facility]   HITS    Physically Hurt: Not on file    Insult or Talk Down To: Not on file    Threaten Physical Harm: Not on file    Scream or Curse: Not on file   Family History  Problem Relation Age of Onset   Thyroid disease Mother    Carpal tunnel syndrome Mother    Thyroid nodules Mother    Hypertension Father    Diabetes Father    Stroke Father       Mardella Layman, MD 12/15/22 1124

## 2022-12-21 ENCOUNTER — Ambulatory Visit: Payer: Medicaid Other | Admitting: Radiology

## 2023-01-11 ENCOUNTER — Ambulatory Visit: Payer: Medicaid Other | Admitting: Radiology

## 2023-01-14 ENCOUNTER — Ambulatory Visit (INDEPENDENT_AMBULATORY_CARE_PROVIDER_SITE_OTHER): Payer: Medicaid Other

## 2023-01-14 ENCOUNTER — Ambulatory Visit (HOSPITAL_COMMUNITY)
Admission: EM | Admit: 2023-01-14 | Discharge: 2023-01-14 | Disposition: A | Discharge status: Home / Self Care | Payer: Medicaid Other | Attending: Internal Medicine | Admitting: Internal Medicine

## 2023-01-14 ENCOUNTER — Encounter (HOSPITAL_COMMUNITY): Payer: Self-pay

## 2023-01-14 DIAGNOSIS — J22 Unspecified acute lower respiratory infection: Secondary | ICD-10-CM

## 2023-01-14 LAB — POCT RAPID STREP A (OFFICE): Rapid Strep A Screen: NEGATIVE

## 2023-01-14 MED ORDER — ACETAMINOPHEN 325 MG PO TABS
ORAL_TABLET | ORAL | Status: AC
  Filled 2023-01-14: qty 2

## 2023-01-14 MED ORDER — HYDROXYZINE HCL 25 MG PO TABS: ORAL_TABLET | ORAL | 0 refills | Status: DC

## 2023-01-14 MED ORDER — AZITHROMYCIN 250 MG PO TABS: 250.0000 mg | ORAL_TABLET | Freq: Every day | ORAL | 0 refills | Status: DC

## 2023-01-14 MED ORDER — IBUPROFEN 800 MG PO TABS
800.0000 mg | ORAL_TABLET | Freq: Once | ORAL | Status: AC
  Administered 2023-01-14: 800 mg via ORAL

## 2023-01-14 MED ORDER — PROMETHAZINE-DM 6.25-15 MG/5ML PO SYRP: 5.0000 mL | ORAL_SOLUTION | Freq: Four times a day (QID) | ORAL | 0 refills | Status: DC | PRN

## 2023-01-14 MED ORDER — ACETAMINOPHEN 325 MG PO TABS
650.0000 mg | ORAL_TABLET | Freq: Once | ORAL | Status: AC
  Administered 2023-01-14: 650 mg via ORAL

## 2023-01-14 MED ORDER — AMOXICILLIN-POT CLAVULANATE 875-125 MG PO TABS
1.0000 | ORAL_TABLET | Freq: Two times a day (BID) | ORAL | 0 refills | Status: AC
Start: 2023-01-14 — End: 2023-01-19

## 2023-01-14 MED ORDER — IBUPROFEN 800 MG PO TABS
ORAL_TABLET | ORAL | Status: AC
  Filled 2023-01-14: qty 1

## 2023-01-14 NOTE — ED Provider Notes (Signed)
[facility]    CSN: [id number] Arrival date & time: 01/14/23  1713      History   Chief Complaint Chief Complaint  Patient presents with   Sore Throat    HPI Lisa Holt is a 32 y.o. female.   Patient presents to clinic with sore throat, nausea, headache, chills and bodyaches that started yesterday.  She noticed after work she was feeling clammy.  She went home and went to sleep and woke up with chills, headache, body aches and nausea.  She has had diminished appetite the past few days and she is having some pains in her stomach, this may be hunger pains.  No vomiting or diarrhea.  Her son was sick with strep throat last week and her daughter had an upper respiratory infection.  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  Reports her cough sounds nasty.  She has been taking DayQuil without much relief.  Endorses wheezing, no history of asthma.  The history is provided by the patient and medical records.  Sore Throat    Past Medical History:  Diagnosis Date   Allergic rhinitis    Anemia    Anorexia    Anxiety    GERD (gastroesophageal reflux disease)    Migraine with aura    Ovarian cyst    Post partum depression    was prescribed meds, took 2, then stopped   Thyroglossal cyst    Vitamin D deficiency     Patient Active Problem List   Diagnosis Date Noted   Bilateral hand pain 01/10/2021   Trigger finger of left hand 01/10/2021   Surveillance of previously prescribed implantable subdermal contraceptive 10/16/2019   Right ovarian cyst 09/18/2018   Chest pain 01/21/2018   Hypokalemia 01/21/2018   Hypomagnesemia 01/21/2018   Influenza due to influenza virus, type B 01/20/2018   HSIL (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 08/06/2015    Past Surgical History:  Procedure Laterality Date   FOREIGN BODY REMOVAL Left 05/17/2022   Procedure: EXCISION FOREIGN BODY LEFT ARM;   Surgeon: Sheliah Hatch, De Blanch, MD;  Location: [facility];  Service: General;  Laterality: Left;  LMA   WISDOM TOOTH EXTRACTION      OB History     Gravida  2   Para  2   Term  2   Preterm      AB      Living  2      SAB      IAB      Ectopic      Multiple  0   Live Births  2            Home Medications    Prior to Admission medications   Medication Sig Start Date End Date Taking? Authorizing Provider  azithromycin (ZITHROMAX) 250 MG tablet Take 1 tablet (250 mg total) by mouth daily. Take first 2 tablets together, then 1 every day until finished. 01/14/23  Yes Rinaldo Ratel, [place] N, FNP  hydrOXYzine (ATARAX) 25 MG tablet Every 6 hours as needed for anxiety. 01/14/23  Yes Rinaldo Ratel, [place] N, FNP  promethazine-dextromethorphan (PROMETHAZINE-DM) 6.25-15 MG/5ML syrup Take 5 mLs by mouth 4 (four) times daily as needed for cough. 01/14/23  Yes Rinaldo Ratel, [place] N, FNP  doxycycline (VIBRAMYCIN) 100 MG capsule Take 1 capsule (100 mg total) by mouth 2 (two) times daily. 12/14/22   Mardella Layman,  MD  Multiple Vitamin (MULTIVITAMIN) tablet Take 1 tablet by mouth daily.    [provider]  ondansetron (ZOFRAN-ODT) 4 MG disintegrating tablet Take 1 tablet (4 mg total) by mouth every 8 (eight) hours as needed for nausea or vomiting. 12/14/22   Mardella Layman, MD    Family History Family History  Problem Relation Age of Onset   Thyroid disease Mother    Carpal tunnel syndrome Mother    Thyroid nodules Mother    Hypertension Father    Diabetes Father    Stroke Father     Social History Social History   Tobacco Use   Smoking status: Every Day    Current packs/day: 0.25    Types: Cigarettes   Smokeless tobacco: Never  Vaping Use   Vaping status: Never Used  Substance Use Topics   Alcohol use: Yes    Comment: occ   Drug use: No    Types: Marijuana, Cocaine    Comment: las Cocaine use Nov. 2016, last marijuana 02/22/15     Allergies    Latex   Review of Systems Review of Systems  Per HPI   Physical Exam Triage Vital Signs ED Triage Vitals [01/14/23 1738]  Encounter Vitals Group     BP 103/71     Systolic BP Percentile      Diastolic BP Percentile      Pulse Rate (!) 109     Resp 16     Temp (!) 102.3 F (39.1 C)     Temp Source Oral     SpO2 95 %     Weight 106 lb (48.1 kg)     Height 5\' 3"  (1.6 m)     Head Circumference      Peak Flow      Pain Score 9     Pain Loc      Pain Education      Exclude from Growth Chart    No data found.  Updated Vital Signs BP 103/71 (BP Location: Right Arm)   Pulse (!) 109   Temp (!) 102.3 F (39.1 C) (Oral)   Resp 16   Ht 5\' 3"  (1.6 m)   Wt 106 lb (48.1 kg)   LMP 12/31/2022 (Approximate)   SpO2 95%   BMI 18.78 kg/m   Visual Acuity Right Eye Distance:   Left Eye Distance:   Bilateral Distance:    Right Eye Near:   Left Eye Near:    Bilateral Near:     Physical Exam Vitals and nursing note reviewed.  Constitutional:      Appearance: Normal appearance. She is well-developed.  HENT:     Head: Normocephalic and atraumatic.     Right Ear: External ear normal.     Left Ear: External ear normal.     Nose: Congestion and rhinorrhea present.     Mouth/Throat:     Mouth: Mucous membranes are moist.     Pharynx: Uvula midline. Posterior oropharyngeal erythema present.     Tonsils: No tonsillar exudate or tonsillar abscesses.  Eyes:     Conjunctiva/sclera: Conjunctivae normal.  Cardiovascular:     Rate and Rhythm: Regular rhythm. Tachycardia present.     Heart sounds: Normal heart sounds. No murmur heard. Pulmonary:     Effort: Pulmonary effort is normal.     Breath sounds: Wheezing present.  Musculoskeletal:        General: Normal range of motion.  Skin:    General: Skin is warm and dry.  Neurological:     General: No focal deficit present.     Mental Status: She is alert.  Psychiatric:        Mood and Affect: Mood normal.      UC  Treatments / Results  Labs (all labs ordered are listed, but only abnormal results are displayed) Labs Reviewed  POCT RAPID STREP A (OFFICE)    EKG   Radiology No results found.  Procedures Procedures (including critical care time)  Medications Ordered in UC Medications  ibuprofen (ADVIL) tablet 800 mg (has no administration in time range)  acetaminophen (TYLENOL) tablet 650 mg (650 mg Oral Given 01/14/23 1746)    Initial Impression / Assessment and Plan / UC Course  I have reviewed the triage vital signs and the nursing notes.  Pertinent labs & imaging results that were available during my care of the patient were reviewed by me and considered in my medical decision making (see chart for details).  Vitals and triage reviewed, patient is hemodynamically stable.  Febrile and tachycardic on presentation, given Tylenol.  Fever not much improved, given ibuprofen prior to discharge.  Chest x-ray suspicious for right lower lobe infiltrate, awaiting official radiology overread.  Will place on azithromycin and amoxicillin to cover for typical and atypical pathogens.  Symptomatic management of cough discussed.  Patient reports cough and secretions makes her anxious and is requesting anxiety medicine will trial as needed hydroxyzine.  Plan of care, follow-up care return precautions given, no questions at this time.     Final Clinical Impressions(s) / UC Diagnoses   Final diagnoses:  Lower respiratory infection     Discharge Instructions      I thought your chest x-ray looks suspicious for pneumonia.  Please take all antibiotics as prescribed and until finished, you can take them with food to prevent gastrointestinal upset.  Take the cough medicine as needed, do not drink or drive on this as it may cause drowsiness.  Ensure you are staying well-hydrated with at least 64 ounces of water daily.  Alternate between 800 mg of ibuprofen and 500 mg of Tylenol every 4-6 hours for any fever,  body aches or chills.  Symptoms should improve with antibiotics, if no improvement despite finishing antibiotics or any changes to her symptoms return to clinic or follow-up with your primary care provider.      ED Prescriptions     Medication Sig Dispense Auth. Provider   azithromycin (ZITHROMAX) 250 MG tablet Take 1 tablet (250 mg total) by mouth daily. Take first 2 tablets together, then 1 every day until finished. 6 tablet Rinaldo Ratel, [place] N, [place]   promethazine-dextromethorphan (PROMETHAZINE-DM) 6.25-15 MG/5ML syrup Take 5 mLs by mouth 4 (four) times daily as needed for cough. 118 mL Rinaldo Ratel, [place] N, FNP   hydrOXYzine (ATARAX) 25 MG tablet Every 6 hours as needed for anxiety. 12 tablet Cynthia Stainback, [place] N, [place]      PDMP not reviewed this encounter.   Elham Fini, [place] N, [place] 01/14/23 [phone]

## 2023-01-14 NOTE — Discharge Instructions (Addendum)
I thought your chest x-ray looks suspicious for pneumonia.  Please take all antibiotics as prescribed and until finished, you can take them with food to prevent gastrointestinal upset.  Take the cough medicine as needed, do not drink or drive on this as it may cause drowsiness.  Ensure you are staying well-hydrated with at least 64 ounces of water daily.  Alternate between 800 mg of ibuprofen and 500 mg of Tylenol every 4-6 hours for any fever, body aches or chills.  Symptoms should improve with antibiotics, if no improvement despite finishing antibiotics or any changes to her symptoms return to clinic or follow-up with your primary care provider.

## 2023-01-14 NOTE — ED Triage Notes (Signed)
Patient here today with c/o ST, nausea, HA, chills, and body aches X 1 day. She tried taking Dayquil with no relief. She has also had a little cough.

## 2023-01-16 ENCOUNTER — Telehealth: Payer: Medicaid Other | Admitting: Physician Assistant

## 2023-01-16 DIAGNOSIS — J189 Pneumonia, unspecified organism: Secondary | ICD-10-CM | POA: Diagnosis not present

## 2023-01-16 NOTE — Progress Notes (Signed)
Virtual Visit Consent   Lisa Holt, you are scheduled for a virtual visit with a [facility] provider today. Just as with appointments in the office, your consent must be obtained to participate. Your consent will be active for this visit and any virtual visit you may have with one of our providers in the next 365 days. If you have a MyChart account, a copy of this consent can be sent to you electronically.  As this is a virtual visit, video technology does not allow for your provider to perform a traditional examination. This may limit your provider's ability to fully assess your condition. If your provider identifies any concerns that need to be evaluated in person or the need to arrange testing (such as labs, EKG, etc.), we will make arrangements to do so. Although advances in technology are sophisticated, we cannot ensure that it will always work on either your end or our end. If the connection with a video visit is poor, the visit may have to be switched to a telephone visit. With either a video or telephone visit, we are not always able to ensure that we have a secure connection.  By engaging in this virtual visit, you consent to the provision of healthcare and authorize for your insurance to be billed (if applicable) for the services provided during this visit. Depending on your insurance coverage, you may receive a charge related to this service.  I need to obtain your verbal consent now. Are you willing to proceed with your visit today? [name] has provided verbal consent on 01/16/2023 for a virtual visit (video or telephone). [name], PA-C  Date: 01/16/2023 7:01 PM  Virtual Visit via Video Note   I, [name], connected with  Lisa Holt  ([id number], 1990/04/05) on 01/16/23 at  7:00 PM EST by a video-enabled telemedicine application and verified that I am speaking with the correct person using two identifiers.  Location: Patient: Virtual Visit Location Patient:  Home Provider: Virtual Visit Location Provider: Home Office   I discussed the limitations of evaluation and management by telemedicine and the availability of in person appointments. The patient expressed understanding and agreed to proceed.    History of Present Illness: Lisa Holt is a 32 y.o. who identifies as a female who was assigned female at birth, and is being seen today for work note.  She was seen on 01/14/23 at Urgent Care and diagnosed with pneumonia.  She reports some improvement, but is still coughing and experiencing intermittent subjective fever.  Pt is taking antibiotics as prescribed.  She is supposed to go back to work tomorrow and is requesting a note since she is still experiencing a fever.   HPI: HPI  Problems:  Patient Active Problem List   Diagnosis Date Noted   Bilateral hand pain 01/10/2021   Trigger finger of left hand 01/10/2021   Surveillance of previously prescribed implantable subdermal contraceptive 10/16/2019   Right ovarian cyst 09/18/2018   Chest pain 01/21/2018   Hypokalemia 01/21/2018   Hypomagnesemia 01/21/2018   Influenza due to influenza virus, type B 01/20/2018   HSIL (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 08/06/2015    Allergies:  Allergies  Allergen Reactions   Latex Itching and Swelling   Medications:  Current Outpatient Medications:    amoxicillin-clavulanate (AUGMENTIN) 875-125 MG tablet, Take 1 tablet by mouth every 12 (twelve) hours for 5 days., Disp: 10 tablet, Rfl: 0   azithromycin (ZITHROMAX)  250 MG tablet, Take 1 tablet (250 mg total) by mouth daily. Take first 2 tablets together, then 1 every day until finished., Disp: 6 tablet, Rfl: 0   doxycycline (VIBRAMYCIN) 100 MG capsule, Take 1 capsule (100 mg total) by mouth 2 (two) times daily., Disp: 14 capsule, Rfl: 0   hydrOXYzine (ATARAX) 25 MG tablet, Every 6 hours as needed for anxiety., Disp: 12 tablet, Rfl: 0   Multiple Vitamin (MULTIVITAMIN)  tablet, Take 1 tablet by mouth daily., Disp: , Rfl:    ondansetron (ZOFRAN-ODT) 4 MG disintegrating tablet, Take 1 tablet (4 mg total) by mouth every 8 (eight) hours as needed for nausea or vomiting., Disp: 15 tablet, Rfl: 0   promethazine-dextromethorphan (PROMETHAZINE-DM) 6.25-15 MG/5ML syrup, Take 5 mLs by mouth 4 (four) times daily as needed for cough., Disp: 118 mL, Rfl: 0  Observations/Objective: Patient is well-developed, well-nourished in no acute distress.  Resting comfortably at home.  Head is normocephalic, atraumatic.  No labored breathing.  Speech is clear and coherent with logical content.  Patient is alert and oriented at baseline.    Assessment and Plan: 1. Pneumonia of lower lobe due to infectious organism, unspecified laterality  Continue with antibiotic as prescribed.  May return to work as long as fever free for 24 hours.   Follow Up Instructions: I discussed the assessment and treatment plan with the patient. The patient was provided an opportunity to ask questions and all were answered. The patient agreed with the plan and demonstrated an understanding of the instructions.  A copy of instructions were sent to the patient via MyChart unless otherwise noted below.     The patient was advised to call back or seek an in-person evaluation if the symptoms worsen or if the condition fails to improve as anticipated.    [name], PA-C

## 2023-01-16 NOTE — Patient Instructions (Signed)
  [name], thank you for joining [name], PA-C for today's virtual visit.  While this provider is not your primary care provider (PCP), if your PCP is located in our provider database this encounter information will be shared with them immediately following your visit.   A [facility] MyChart account gives you access to today's visit and all your visits, tests, and labs performed at [facility] " click here if you don't have a [facility] MyChart account or go to [email or web address]  Consent: (Patient) Lisa Holt provided verbal consent for this virtual visit at the beginning of the encounter.  Current Medications:  Current Outpatient Medications:    amoxicillin-clavulanate (AUGMENTIN) 875-125 MG tablet, Take 1 tablet by mouth every 12 (twelve) hours for 5 days., Disp: 10 tablet, Rfl: 0   azithromycin (ZITHROMAX) 250 MG tablet, Take 1 tablet (250 mg total) by mouth daily. Take first 2 tablets together, then 1 every day until finished., Disp: 6 tablet, Rfl: 0   doxycycline (VIBRAMYCIN) 100 MG capsule, Take 1 capsule (100 mg total) by mouth 2 (two) times daily., Disp: 14 capsule, Rfl: 0   hydrOXYzine (ATARAX) 25 MG tablet, Every 6 hours as needed for anxiety., Disp: 12 tablet, Rfl: 0   Multiple Vitamin (MULTIVITAMIN) tablet, Take 1 tablet by mouth daily., Disp: , Rfl:    ondansetron (ZOFRAN-ODT) 4 MG disintegrating tablet, Take 1 tablet (4 mg total) by mouth every 8 (eight) hours as needed for nausea or vomiting., Disp: 15 tablet, Rfl: 0   promethazine-dextromethorphan (PROMETHAZINE-DM) 6.25-15 MG/5ML syrup, Take 5 mLs by mouth 4 (four) times daily as needed for cough., Disp: 118 mL, Rfl: 0   Medications ordered in this encounter:  No orders of the defined types were placed in this encounter.    *If you need refills on other medications prior to your next appointment, please contact your pharmacy*  Follow-Up: Call back or seek an in-person evaluation if the  symptoms worsen or if the condition fails to improve as anticipated.  [facility] [phone]  Other Instructions Continue with antibiotic as prescribed.  May return to work as long as fever free for 24 hours.  Work note provided.    If you have been instructed to have an in-person evaluation today at a local Urgent Care facility, please use the link below. It will take you to a list of all of our available [facility], including address, phone number and hours of operation. Please do not delay care.  [facility]  If you or a family member do not have a primary care provider, use the link below to schedule a visit and establish care. When you choose a [facility] primary care physician or advanced practice provider, you gain a long-term partner in health. Find a Primary Care Provider  Learn more about [facility]'s in-office and virtual care options: [facility] - Get Care Now

## 2023-02-09 ENCOUNTER — Ambulatory Visit: Payer: Medicaid Other | Admitting: Radiology

## 2023-03-07 ENCOUNTER — Encounter (HOSPITAL_COMMUNITY): Payer: Self-pay

## 2023-03-07 ENCOUNTER — Ambulatory Visit (HOSPITAL_COMMUNITY): Payer: Medicaid Other

## 2023-03-07 ENCOUNTER — Emergency Department (HOSPITAL_COMMUNITY)
Admission: EM | Admit: 2023-03-07 | Discharge: 2023-03-07 | Discharge status: Against Medical Advice | Payer: Medicaid Other | Attending: Emergency Medicine | Admitting: Emergency Medicine

## 2023-03-07 ENCOUNTER — Other Ambulatory Visit: Payer: Self-pay

## 2023-03-07 DIAGNOSIS — R519 Headache, unspecified: Secondary | ICD-10-CM | POA: Insufficient documentation

## 2023-03-07 DIAGNOSIS — Z5321 Procedure and treatment not carried out due to patient leaving prior to being seen by health care provider: Secondary | ICD-10-CM | POA: Insufficient documentation

## 2023-03-07 DIAGNOSIS — H6002 Abscess of left external ear: Secondary | ICD-10-CM | POA: Diagnosis present

## 2023-03-07 DIAGNOSIS — R110 Nausea: Secondary | ICD-10-CM | POA: Insufficient documentation

## 2023-03-07 DIAGNOSIS — F419 Anxiety disorder, unspecified: Secondary | ICD-10-CM | POA: Diagnosis not present

## 2023-03-07 NOTE — ED Triage Notes (Signed)
 Patient reports abscess on back of left ear causing headache x 1 month. Denies fevers. Patient also reports her fingers and toes are intermittently locking up x 2 weeks. Has nausea, patient believes this is d/t her anxiety, states she is stressed about work.

## 2023-03-08 ENCOUNTER — Other Ambulatory Visit: Payer: Self-pay

## 2023-03-08 ENCOUNTER — Ambulatory Visit (HOSPITAL_COMMUNITY)
Admission: EM | Admit: 2023-03-08 | Discharge: 2023-03-08 | Disposition: A | Discharge status: Home / Self Care | Payer: Medicaid Other

## 2023-03-08 ENCOUNTER — Encounter (HOSPITAL_COMMUNITY): Payer: Self-pay

## 2023-03-08 VITALS — BP 102/70 | HR 96 | Temp 99.1°F | Resp 20

## 2023-03-08 DIAGNOSIS — D170 Benign lipomatous neoplasm of skin and subcutaneous tissue of head, face and neck: Secondary | ICD-10-CM

## 2023-03-08 DIAGNOSIS — L720 Epidermal cyst: Secondary | ICD-10-CM | POA: Diagnosis not present

## 2023-03-08 DIAGNOSIS — R252 Cramp and spasm: Secondary | ICD-10-CM

## 2023-03-08 NOTE — Discharge Instructions (Signed)
 Apply heat to cyst on your ear. See if that helps make it smaller. If not, or if it is still bothering you when it is smaller, contact the surgeon's office for follow up. They can also help you decide if you want to have the neck lump removed.   Eat more bananas and take over the counter magnesium  and see if that helps relieve your muscle cramping.

## 2023-03-08 NOTE — ED Triage Notes (Signed)
 There is a swollen area to the back of left earlobe.  Patient complains of head pain.  Patient has noticed this bump for 2 weeks.patient went to [facility] last night, but did not get seen due to the wait.    Reports fingers "lock up " for a few years.  Patient reports her toes have started doing this as well.    Has not had any medications for symptoms

## 2023-03-08 NOTE — ED Notes (Signed)
 Patient talks of a lot of stress at work and at home.  Patient is working many, short staffed and 2 young children at home and her father has recently had a fourth stroke.

## 2023-03-09 NOTE — ED Provider Notes (Signed)
[facility]    CSN: [id number] Arrival date & time: 03/08/23  1827      History   Chief Complaint Chief Complaint  Patient presents with   Abscess   Appointment    18:30    HPI Lisa Holt is a 33 y.o. female.   Pt c/o swollen area to the back of left earlobe.  Has been there for about a month, progressively getting bigger. Doesn't hurt, but skin in the area has been itchy.    Also reports her fingers "lock up "; this has been happening for a few years but now is getting worse in hands and is now happening in her toes. When she flexes her finger and toe joints they sometimes get "stuck" and she has trouble extending them     Past Medical History:  Diagnosis Date   Allergic rhinitis    Anemia    Anorexia    Anxiety    GERD (gastroesophageal reflux disease)    Migraine with aura    Ovarian cyst    Post partum depression    was prescribed meds, took 2, then stopped   Thyroglossal cyst    Vitamin D deficiency     Patient Active Problem List   Diagnosis Date Noted   Bilateral hand pain 01/10/2021   Trigger finger of left hand 01/10/2021   Surveillance of previously prescribed implantable subdermal contraceptive 10/16/2019   Right ovarian cyst 09/18/2018   Chest pain 01/21/2018   Hypokalemia 01/21/2018   Hypomagnesemia 01/21/2018   Influenza due to influenza virus, type B 01/20/2018   HSIL (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 08/06/2015    Past Surgical History:  Procedure Laterality Date   FOREIGN BODY REMOVAL Left 05/17/2022   Procedure: EXCISION FOREIGN BODY LEFT ARM;  Surgeon: Sheliah Hatch, De Blanch, MD;  Location: [facility];  Service: General;  Laterality: Left;  LMA   WISDOM TOOTH EXTRACTION      OB History     Gravida  2   Para  2   Term  2   Preterm      AB      Living  2      SAB      IAB      Ectopic      Multiple  0   Live Births  2            Home  Medications    Prior to Admission medications   Medication Sig Start Date End Date Taking? Authorizing Provider  azithromycin (ZITHROMAX) 250 MG tablet Take 1 tablet (250 mg total) by mouth daily. Take first 2 tablets together, then 1 every day until finished. Patient not taking: Reported on 03/08/2023 01/14/23   Garrison, [place] N, FNP  doxycycline (VIBRAMYCIN) 100 MG capsule Take 1 capsule (100 mg total) by mouth 2 (two) times daily. Patient not taking: Reported on 03/08/2023 12/14/22   Mardella Layman, MD  hydrOXYzine (ATARAX) 25 MG tablet Every 6 hours as needed for anxiety. Patient not taking: Reported on 03/08/2023 01/14/23   Garrison, [place] N, FNP  Multiple Vitamin (MULTIVITAMIN) tablet Take 1 tablet by mouth daily. Patient not taking: Reported on 03/08/2023    [provider]  ondansetron (ZOFRAN-ODT) 4 MG disintegrating tablet Take 1 tablet (4 mg total) by mouth every 8 (eight) hours as needed for nausea or vomiting. Patient not taking: Reported on 03/08/2023 12/14/22   Mardella Layman, MD  promethazine-dextromethorphan (PROMETHAZINE-DM) 6.25-15  MG/5ML syrup Take 5 mLs by mouth 4 (four) times daily as needed for cough. Patient not taking: Reported on 03/08/2023 01/14/23   Garrison, [place] N, FNP    Family History Family History  Problem Relation Age of Onset   Thyroid disease Mother    Carpal tunnel syndrome Mother    Thyroid nodules Mother    Hypertension Father    Diabetes Father    Stroke Father     Social History Social History   Tobacco Use   Smoking status: Every Day    Current packs/day: 0.25    Types: Cigarettes   Smokeless tobacco: Never  Vaping Use   Vaping status: Never Used  Substance Use Topics   Alcohol use: Yes    Comment: occ   Drug use: No    Types: Marijuana, Cocaine    Comment: las Cocaine use Nov. 2016, last marijuana 02/22/15     Allergies   Latex   Review of Systems Review of Systems   Physical Exam Triage Vital Signs ED Triage  Vitals  Encounter Vitals Group     BP 03/08/23 1856 102/70     Systolic BP Percentile --      Diastolic BP Percentile --      Pulse Rate 03/08/23 1856 96     Resp 03/08/23 1856 20     Temp 03/08/23 1856 99.1 F (37.3 C)     Temp Source 03/08/23 1856 Oral     SpO2 03/08/23 1856 97 %     Weight --      Height --      Head Circumference --      Peak Flow --      Pain Score 03/08/23 1852 9     Pain Loc --      Pain Education --      Exclude from Growth Chart --    No data found.  Updated Vital Signs BP 102/70 (BP Location: Right Arm)   Pulse 96   Temp 99.1 F (37.3 C) (Oral)   Resp 20   LMP 03/02/2023   SpO2 97%   Visual Acuity Right Eye Distance:   Left Eye Distance:   Bilateral Distance:    Right Eye Near:   Left Eye Near:    Bilateral Near:     Physical Exam Constitutional:      General: She is not in acute distress.    Appearance: Normal appearance. She is not ill-appearing.  HENT:     Head:      Ears:     Comments: No swelling or erythema or tenderness to posterior L earlobe Pulmonary:     Effort: Pulmonary effort is normal.  Musculoskeletal:     Right hand: Normal.     Left hand: Normal.  Neurological:     Mental Status: She is alert.      UC Treatments / Results  Labs (all labs ordered are listed, but only abnormal results are displayed) Labs Reviewed - No data to display  EKG   Radiology No results found.  Procedures Procedures (including critical care time)  Medications Ordered in UC Medications - No data to display  Initial Impression / Assessment and Plan / UC Course  I have reviewed the triage vital signs and the nursing notes.  Pertinent labs & imaging results that were available during my care of the patient were reviewed by me and considered in my medical decision making (see chart for details).    I suspect finger  and toe "locking" are muscle cramps/spasms. Discussed otc treatment trial with magnesium, f/u with pcp if not  improving. Discussed options for epidermoid cyst of L earlobe - pt elects to try heat therapy and to f/u with surgery office for further care. There are no signs of infection of this area.   Final Clinical Impressions(s) / UC Diagnoses   Final diagnoses:  Epidermoid cyst of skin of ear  Lipoma of neck  Muscle cramping     Discharge Instructions      Apply heat to cyst on your ear. See if that helps make it smaller. If not, or if it is still bothering you when it is smaller, contact the surgeon's office for follow up. They can also help you decide if you want to have the neck lump removed.   Eat more bananas and take over the counter magnesium and see if that helps relieve your muscle cramping.    ED Prescriptions   None    PDMP not reviewed this encounter.   Cathlyn Parsons, NP 03/09/23 [phone]

## 2023-03-21 ENCOUNTER — Ambulatory Visit
Admission: RE | Admit: 2023-03-21 | Discharge: 2023-03-21 | Disposition: A | Discharge status: Home / Self Care | Payer: Medicaid Other | Source: Ambulatory Visit | Attending: Family Medicine | Admitting: Family Medicine

## 2023-03-21 ENCOUNTER — Telehealth: Payer: Self-pay | Admitting: *Deleted

## 2023-03-21 VITALS — BP 107/73 | HR 92 | Temp 99.6°F | Resp 20

## 2023-03-21 DIAGNOSIS — F17210 Nicotine dependence, cigarettes, uncomplicated: Secondary | ICD-10-CM | POA: Diagnosis not present

## 2023-03-21 DIAGNOSIS — J988 Other specified respiratory disorders: Secondary | ICD-10-CM | POA: Insufficient documentation

## 2023-03-21 DIAGNOSIS — J309 Allergic rhinitis, unspecified: Secondary | ICD-10-CM | POA: Insufficient documentation

## 2023-03-21 DIAGNOSIS — B9789 Other viral agents as the cause of diseases classified elsewhere: Secondary | ICD-10-CM | POA: Insufficient documentation

## 2023-03-21 DIAGNOSIS — F17200 Nicotine dependence, unspecified, uncomplicated: Secondary | ICD-10-CM | POA: Diagnosis present

## 2023-03-21 DIAGNOSIS — B349 Viral infection, unspecified: Secondary | ICD-10-CM

## 2023-03-21 LAB — POCT INFLUENZA A/B
Influenza A, POC: NEGATIVE
Influenza B, POC: NEGATIVE

## 2023-03-21 MED ORDER — ONDANSETRON 4 MG PO TBDP: 4.0000 mg | ORAL_TABLET | Freq: Three times a day (TID) | ORAL | 0 refills | Status: DC | PRN

## 2023-03-21 MED ORDER — PROMETHAZINE-DM 6.25-15 MG/5ML PO SYRP: 5.0000 mL | ORAL_SOLUTION | Freq: Three times a day (TID) | ORAL | 0 refills | Status: DC | PRN

## 2023-03-21 MED ORDER — IBUPROFEN 600 MG PO TABS: 600.0000 mg | ORAL_TABLET | Freq: Four times a day (QID) | ORAL | 0 refills | Status: DC | PRN

## 2023-03-21 MED ORDER — PSEUDOEPHEDRINE HCL 30 MG PO TABS: 30.0000 mg | ORAL_TABLET | Freq: Three times a day (TID) | ORAL | 0 refills | Status: DC | PRN

## 2023-03-21 MED ORDER — CETIRIZINE HCL 10 MG PO TABS: 10.0000 mg | ORAL_TABLET | Freq: Every day | ORAL | 0 refills | Status: DC

## 2023-03-21 NOTE — ED Provider Notes (Signed)
[facility]  Note:  This document was prepared using Conservation officer, historic buildings and may include unintentional dictation errors.  MRN: [id number] DOB: 11/19/1990  Subjective:   Lisa Holt is a 33 y.o. female presenting for 5-6 day history of acute onset malaise, fatigue, fevers, sinus and chest congestion, body pains.  Has been using Tylenol cold flu. No chest pain, wheezing.  Smokes a few cigarettes a day.  No current facility-administered medications for this encounter.  Current Outpatient Medications:    azithromycin (ZITHROMAX) 250 MG tablet, Take 1 tablet (250 mg total) by mouth daily. Take first 2 tablets together, then 1 every day until finished. (Patient not taking: Reported on 03/08/2023), Disp: 6 tablet, Rfl: 0   doxycycline (VIBRAMYCIN) 100 MG capsule, Take 1 capsule (100 mg total) by mouth 2 (two) times daily. (Patient not taking: Reported on 03/08/2023), Disp: 14 capsule, Rfl: 0   hydrOXYzine (ATARAX) 25 MG tablet, Every 6 hours as needed for anxiety. (Patient not taking: Reported on 03/08/2023), Disp: 12 tablet, Rfl: 0   Multiple Vitamin (MULTIVITAMIN) tablet, Take 1 tablet by mouth daily. (Patient not taking: Reported on 03/08/2023), Disp: , Rfl:    ondansetron (ZOFRAN-ODT) 4 MG disintegrating tablet, Take 1 tablet (4 mg total) by mouth every 8 (eight) hours as needed for nausea or vomiting. (Patient not taking: Reported on 03/08/2023), Disp: 15 tablet, Rfl: 0   promethazine-dextromethorphan (PROMETHAZINE-DM) 6.25-15 MG/5ML syrup, Take 5 mLs by mouth 4 (four) times daily as needed for cough. (Patient not taking: Reported on 03/08/2023), Disp: 118 mL, Rfl: 0   Allergies  Allergen Reactions   Latex Itching and Swelling    Past Medical History:  Diagnosis Date   Allergic rhinitis    Anemia    Anorexia    Anxiety    GERD (gastroesophageal reflux disease)    Migraine with aura    Ovarian cyst    Post partum depression    was prescribed meds,  took 2, then stopped   Thyroglossal cyst    Vitamin D deficiency      Past Surgical History:  Procedure Laterality Date   FOREIGN BODY REMOVAL Left 05/17/2022   Procedure: EXCISION FOREIGN BODY LEFT ARM;  Surgeon: Rodman Pickle, MD;  Location: [facility];  Service: General;  Laterality: Left;  LMA   WISDOM TOOTH EXTRACTION      Family History  Problem Relation Age of Onset   Thyroid disease Mother    Carpal tunnel syndrome Mother    Thyroid nodules Mother    Hypertension Father    Diabetes Father    Stroke Father     Social History   Tobacco Use   Smoking status: Every Day    Current packs/day: 0.25    Types: Cigarettes   Smokeless tobacco: Never  Vaping Use   Vaping status: Never Used  Substance Use Topics   Alcohol use: Yes    Comment: occ   Drug use: No    Types: Marijuana, Cocaine    Comment: las Cocaine use Nov. 2016, last marijuana 02/22/15    ROS   Objective:   Vitals: BP 107/73 (BP Location: Left Arm)   Pulse 92   Temp 99.6 F (37.6 C) (Oral)   Resp 20   LMP 03/02/2023   SpO2 98%   Physical Exam Constitutional:      General: She is not in acute distress.    Appearance: Normal appearance. She is well-developed and normal weight. She  is not ill-appearing, toxic-appearing or diaphoretic.  HENT:     Head: Normocephalic and atraumatic.     Right Ear: Tympanic membrane, ear canal and external ear normal. No drainage or tenderness. No middle ear effusion. There is no impacted cerumen. Tympanic membrane is not erythematous or bulging.     Left Ear: Tympanic membrane, ear canal and external ear normal. No drainage or tenderness.  No middle ear effusion. There is no impacted cerumen. Tympanic membrane is not erythematous or bulging.     Nose: Congestion present. No rhinorrhea.     Mouth/Throat:     Mouth: Mucous membranes are moist. No oral lesions.     Pharynx: No pharyngeal swelling, oropharyngeal exudate, posterior oropharyngeal  erythema or uvula swelling.     Tonsils: No tonsillar exudate or tonsillar abscesses.  Eyes:     General: No scleral icterus.       Right eye: No discharge.        Left eye: No discharge.     Extraocular Movements: Extraocular movements intact.     Right eye: Normal extraocular motion.     Left eye: Normal extraocular motion.     Conjunctiva/sclera: Conjunctivae normal.  Cardiovascular:     Rate and Rhythm: Normal rate and regular rhythm.     Heart sounds: Normal heart sounds. No murmur heard.    No friction rub. No gallop.  Pulmonary:     Effort: Pulmonary effort is normal. No respiratory distress.     Breath sounds: No stridor. No wheezing, rhonchi or rales.  Chest:     Chest wall: No tenderness.  Musculoskeletal:     Cervical back: Normal range of motion and neck supple.  Lymphadenopathy:     Cervical: No cervical adenopathy.  Skin:    General: Skin is warm and dry.  Neurological:     General: No focal deficit present.     Mental Status: She is alert and oriented to person, place, and time.  Psychiatric:        Mood and Affect: Mood normal.        Behavior: Behavior normal.     Results for orders placed or performed during the hospital encounter of 03/21/23 (from the past 24 hours)  POCT Influenza A/B     Status: Normal   Collection Time: 03/21/23  6:39 PM  Result Value Ref Range   Influenza A, POC Negative    Influenza B, POC Negative     Assessment and Plan :   PDMP not reviewed this encounter.  1. Viral respiratory infection   2. Allergic rhinitis, unspecified seasonality, unspecified trigger   3. Smoker   4. Acute viral syndrome    Deferred imaging given clear cardiopulmonary exam, hemodynamically stable vital signs.  COVID testing pending.  Suspect viral URI, viral syndrome. Physical exam findings reassuring and vital signs stable for discharge. Advised supportive care, offered symptomatic relief. Counseled patient on potential for adverse effects with  medications prescribed/recommended today, ER and return-to-clinic precautions discussed, patient verbalized understanding.     Wallis Bamberg, [place] 03/21/23 [id number]

## 2023-03-21 NOTE — ED Triage Notes (Signed)
Pt c/o fever, head/chest congestion, body aches-sx started 1/22-taking tylenol flu and cold-NAD-steady gait

## 2023-03-21 NOTE — Discharge Instructions (Signed)
We will notify you of your test results as they arrive and may take between about 24 hours.  I encourage you to sign up for MyChart if you have not already done so as this can be the easiest way for [place] to communicate results to you online or through a phone app.  Generally, we only contact you if it is a positive test result.  In the meantime, if you develop worsening symptoms including fever, chest pain, shortness of breath despite our current treatment plan then please report to the emergency room as this may be a sign of worsening status from possible viral infection.  Otherwise, we will manage this as a viral syndrome. For sore throat or cough try using a honey-based tea. Use 3 teaspoons of honey with juice squeezed from half lemon. Place shaved pieces of ginger into 1/2-1 cup of water and warm over stove top. Then mix the ingredients and repeat every 4 hours as needed. Please take Tylenol 500mg -650mg  every 6 hours for aches and pains, fevers. Hydrate very well with at least 2 liters of water. Eat light meals such as soups to replenish electrolytes and soft fruits, veggies. Start an antihistamine like Zyrtec (10mg  daily) for postnasal drainage, sinus congestion.  You can take this together with pseudoephedrine (Sudafed) at a dose of 30 mg 2-3 times a day as needed for the same kind of congestion.  Use the cough medications as needed.

## 2023-03-21 NOTE — Telephone Encounter (Signed)
-----   Message from [facility] sent at 03/10/2023  1:49 PM EST ----- Regarding: Pap Recall for 2/24 She was suppose to come to appt on 02-09-23 for TOC of trich & then come back later for a LEEP. She no showed appt. No appts are scheduled.

## 2023-03-21 NOTE — Telephone Encounter (Signed)
F/u for abnormal pap. See 09/12/22 Telephone encounter.   Dr. Edward Jolly -please review and advise.

## 2023-03-22 ENCOUNTER — Encounter: Payer: Self-pay | Admitting: Obstetrics and Gynecology

## 2023-03-22 LAB — SARS CORONAVIRUS 2 (TAT 6-24 HRS): SARS Coronavirus 2: NEGATIVE

## 2023-03-22 NOTE — Telephone Encounter (Signed)
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   Patient is at risk of developing cervical cancer.

## 2023-03-30 NOTE — Telephone Encounter (Signed)
 Letter pended. Copy routed to Dr. Colvin Dec to review.

## 2023-03-31 NOTE — Telephone Encounter (Signed)
 Modification of letter recommended.  See file attached to letter.

## 2023-03-31 NOTE — Telephone Encounter (Signed)
 Letter addended and printed.   Copy to Dr. Colvin Dec to review and sign to be mailed.

## 2023-04-01 ENCOUNTER — Telehealth: Payer: Medicaid Other | Admitting: Urgent Care

## 2023-04-01 DIAGNOSIS — R066 Hiccough: Secondary | ICD-10-CM | POA: Diagnosis not present

## 2023-04-01 DIAGNOSIS — K219 Gastro-esophageal reflux disease without esophagitis: Secondary | ICD-10-CM | POA: Diagnosis not present

## 2023-04-01 MED ORDER — PANTOPRAZOLE SODIUM 40 MG PO TBEC
40.0000 mg | DELAYED_RELEASE_TABLET | Freq: Every day | ORAL | 0 refills | Status: DC
Start: 2023-04-01 — End: 2023-10-28

## 2023-04-01 MED ORDER — HYOSCYAMINE SULFATE 0.125 MG SL SUBL: 0.1250 mg | SUBLINGUAL_TABLET | SUBLINGUAL | 0 refills | Status: DC | PRN

## 2023-04-01 NOTE — Patient Instructions (Signed)
 Start Levsin  every 4 hours as needed. Place the tablet under your tongue. Also start taking pantoprazole  daily. This works best if taken 30 min before breakfast in the morning.  Please follow up with your PCP for further assessment and workup should symptoms fail to respond to treatment.  Please head to the ER if you develop severe or worsening pain, fever with nausea and vomiting, or any acutely worsening symptoms.

## 2023-04-01 NOTE — Progress Notes (Signed)
 Virtual Visit via Video Note  I connected with Lisa Holt on 04/01/23 at  9:15 PM EST by a video enabled telemedicine application and verified that I am speaking with the correct person using two identifiers.  Patient Location: Home Provider Location: Office/Clinic  I discussed the limitations, risks, security, and privacy concerns of performing an evaluation and management service by video and the availability of in person appointments. I also discussed with the patient that there may be a patient responsible charge related to this service. The patient expressed understanding and agreed to proceed.   Subjective  PCP: [facility]  No chief complaint on file.   HPI: 33yo female presents with concerns regarding abdominal discomfort. She states she has been having the same sx occur intermittently (every 2-4 months) for the past 2+ years. She states she has been seen in the ER for it, at PCP and gynecology, and at GI in the past and no once can figure out what's causing it. Essentially, pt states she will get a sharp, stabbing cramping sensation the starts spontaneously and sporadically without known cause.  She states she woke up this morning at 4 AM to had to work.  She works as a LAWYER at a nursing home.  States while there this morning, she developed the severe cramping pain.  When these episodes occur, she states her legs buckle up, and she cannot move.  She states the symptoms will last for 1 to 2 hours, before improving spontaneously without treatment.  She does have a known history of esophageal reflux, for which she states she takes Pepcid and Pepto-Bismol.  In the past, she has been tried on dicyclomine  and NSAIDs for the symptoms, without improvement.  Today, she thought the onset was secondary to her menstrual period which started this morning, but she states the symptoms are up near the rib cage not in her pelvis.  During the encounter, she states the symptoms are  significantly improved compared to this morning.  She denies fever, nausea, vomiting.  She was able to eat dinner not long ago.  No change in bowel movements.  ROS: Per HPI. All other pertinent systems are negative.   Current Outpatient Medications:    hyoscyamine  (LEVSIN  SL) 0.125 MG SL tablet, Take 1 tablet (0.125 mg total) by mouth every 4 (four) hours as needed for cramping., Disp: 30 tablet, Rfl: 0   pantoprazole  (PROTONIX ) 40 MG tablet, Take 1 tablet (40 mg total) by mouth daily., Disp: 30 tablet, Rfl: 0   cetirizine  (ZYRTEC  ALLERGY) 10 MG tablet, Take 1 tablet (10 mg total) by mouth daily., Disp: 30 tablet, Rfl: 0   ibuprofen  (ADVIL ) 600 MG tablet, Take 1 tablet (600 mg total) by mouth every 6 (six) hours as needed., Disp: 30 tablet, Rfl: 0   ondansetron  (ZOFRAN -ODT) 4 MG disintegrating tablet, Take 1 tablet (4 mg total) by mouth every 8 (eight) hours as needed for nausea or vomiting., Disp: 20 tablet, Rfl: 0   promethazine -dextromethorphan  (PROMETHAZINE -DM) 6.25-15 MG/5ML syrup, Take 5 mLs by mouth 3 (three) times daily as needed for cough., Disp: 200 mL, Rfl: 0   pseudoephedrine  (SUDAFED) 30 MG tablet, Take 1 tablet (30 mg total) by mouth every 8 (eight) hours as needed for congestion., Disp: 30 tablet, Rfl: 0    Objective  Vital signs not able to be obtained due to this being a virtual visit.  Physical Exam Constitutional:      General: She is not in acute distress.  Appearance: Normal appearance. She is normal weight. She is not ill-appearing, toxic-appearing or diaphoretic.  HENT:     Head: Normocephalic.  Pulmonary:     Effort: Pulmonary effort is normal. No respiratory distress.  Abdominal:     General: Abdomen is flat. There is no distension.     Palpations: Abdomen is soft.     Tenderness: There is no abdominal tenderness. There is no guarding or rebound.       Comments: Pt palpated stomach without apparent guarding or rebound  Neurological:     Mental Status: She  is alert.      Assessment & Plan Spasm of diaphragm  1. Spasm of diaphragm - hyoscyamine  (LEVSIN  SL) 0.125 MG SL tablet; Take 1 tablet (0.125 mg total) by mouth every 4 (four) hours as needed for cramping.  2. Gastroesophageal reflux disease, unspecified whether esophagitis present - pantoprazole  (PROTONIX ) 40 MG tablet; Take 1 tablet (40 mg total) by mouth daily.  Gastroesophageal reflux disease, unspecified whether esophagitis present  1. Spasm of diaphragm - hyoscyamine  (LEVSIN  SL) 0.125 MG SL tablet; Take 1 tablet (0.125 mg total) by mouth every 4 (four) hours as needed for cramping.  2. Gastroesophageal reflux disease, unspecified whether esophagitis present - pantoprazole  (PROTONIX ) 40 MG tablet; Take 1 tablet (40 mg total) by mouth daily.  Discussed with pt that her symptoms are most concerning for a diphragmatic spasm given location, frequency and timeframe of onset. There are no sx currently to suggest appendicitis or cholecystitis. Ddx would include esophageal spasm, PUD, h pylori although she did have EGD in 2022 which was normal. Will initiate trial of levsin  for suspected esophageal / diaphragm spasm/cramp. Take every 4 hours as needed sublingual. Will also start PPI given hx of gerd. Recommended pt follow up with PCP for a further workup.   No follow-ups on file.   I discussed the assessment and treatment plan with the patient. The patient was provided an opportunity to ask questions, and all were answered. The patient agreed with the plan and demonstrated an understanding of the instructions.   The patient was advised to call back or seek an in-person evaluation if the symptoms worsen or if the condition fails to improve as anticipated.  The above assessment and management plan was discussed with the patient. The patient verbalized understanding of and has agreed to the management plan.   Benton LITTIE Gave, PA

## 2023-04-03 NOTE — Telephone Encounter (Signed)
 Letter reviewed and signed by Dr. Colvin Dec.   Future orders cancelled.   Letter also sent via MyChart.   Encounter closed.

## 2023-04-03 NOTE — Telephone Encounter (Signed)
 Letter mailed

## 2023-10-28 ENCOUNTER — Ambulatory Visit (HOSPITAL_COMMUNITY)
Admission: EM | Admit: 2023-10-28 | Discharge: 2023-10-28 | Disposition: A | Discharge status: Home / Self Care | Attending: Physician Assistant | Admitting: Physician Assistant

## 2023-10-28 ENCOUNTER — Encounter (HOSPITAL_COMMUNITY): Payer: Self-pay

## 2023-10-28 ENCOUNTER — Ambulatory Visit (INDEPENDENT_AMBULATORY_CARE_PROVIDER_SITE_OTHER)

## 2023-10-28 DIAGNOSIS — N939 Abnormal uterine and vaginal bleeding, unspecified: Secondary | ICD-10-CM | POA: Diagnosis present

## 2023-10-28 DIAGNOSIS — M5441 Lumbago with sciatica, right side: Secondary | ICD-10-CM | POA: Diagnosis not present

## 2023-10-28 DIAGNOSIS — R051 Acute cough: Secondary | ICD-10-CM

## 2023-10-28 DIAGNOSIS — J069 Acute upper respiratory infection, unspecified: Secondary | ICD-10-CM

## 2023-10-28 LAB — POCT URINE PREGNANCY: Preg Test, Ur: NEGATIVE

## 2023-10-28 LAB — POC SARS CORONAVIRUS 2 AG -  ED: SARS Coronavirus 2 Ag: NEGATIVE

## 2023-10-28 MED ORDER — METHOCARBAMOL 500 MG PO TABS: 500.0000 mg | ORAL_TABLET | Freq: Two times a day (BID) | ORAL | 0 refills | Status: DC

## 2023-10-28 MED ORDER — CETIRIZINE HCL 10 MG PO TABS: 10.0000 mg | ORAL_TABLET | Freq: Every day | ORAL | 0 refills | Status: DC

## 2023-10-28 MED ORDER — BENZONATATE 100 MG PO CAPS: 100.0000 mg | ORAL_CAPSULE | Freq: Three times a day (TID) | ORAL | 0 refills | Status: DC

## 2023-10-28 NOTE — ED Provider Notes (Signed)
 [facility]    CSN: [id number] Arrival date & time: 10/28/23  1214      History   Chief Complaint Chief Complaint  Patient presents with   Cough   Back Pain    HPI Lisa Holt is a 33 y.o. female.   Patient presents today with several concerns.  Her primary concern today is a several day history of recurrent right sided sciatica.  She denies any known injury or recent trauma but does report that she has been working more in a physically demanding job and wonders if she could have triggered something.  She is already prescribed ibuprofen  800 mg for any issue and reports that this has been ineffective in managing her back.  Pain is rated 8 on a 0-10 pain scale, described as aching, no aggravating or alleviating factors identified.  She denies any bowel/bladder incontinence, lower extremity weakness, saddle anesthesia.  Denies previous injury or surgery involving her back.  In addition, she reports a 3-day history of URI symptoms including fever with Tmax of 102 F, wheezing, cough, congestion.  She denies any chest pain, nausea, vomiting, diarrhea.  She has not been taking any over-the-counter medications.  Denies any recent antibiotics or steroids.  She denies history of asthma or COPD.  She is a current everyday smoker.  She has had COVID in 2023 but not more recently.  She has not had COVID vaccines.  She does report that there have been several people who have had a viral illness at her place of employment.  In addition, she reports 2 weeks of vaginal spotting.  This began after she had Depo-Provera  injection about 4 weeks ago after having been off contraception for a period of time.  She does report some vaginal odor but denies any significant discharge.  Denies any pelvic pain, abdominal pain, fever, nausea, vomiting.  She reports the spotting is very light and typically only noticed on the toilet tissue; she is not having to use any kind of panty liner or pad at this  time.    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   GERD (gastroesophageal reflux disease)    Migraine with aura    Ovarian cyst    Post partum depression    was prescribed meds, took 2, then stopped   Thyroglossal cyst    Trichomonas infection 08/03/2022   needs test of cure   Vitamin D  deficiency     Patient Active Problem List   Diagnosis Date Noted   Bilateral hand pain 01/10/2021   Trigger finger of left hand 01/10/2021   Surveillance of previously prescribed implantable subdermal contraceptive 10/16/2019   Right ovarian cyst 09/18/2018   Chest pain 01/21/2018   Hypokalemia 01/21/2018   Hypomagnesemia 01/21/2018   Influenza due to influenza virus, type B 01/20/2018   HSIL (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 08/06/2015    Past Surgical History:  Procedure Laterality Date   FOREIGN BODY REMOVAL Left 05/17/2022   Procedure: EXCISION FOREIGN BODY LEFT ARM;  Surgeon: Stevie Herlene Righter, MD;  Location: [facility];  Service: General;  Laterality: Left;  LMA   WISDOM TOOTH EXTRACTION      OB History     Gravida  2   Para  2   Term  2  Preterm      AB      Living  2      SAB      IAB      Ectopic      Multiple  0   Live Births  2            Home Medications    Prior to Admission medications   Medication Sig Start Date End Date Taking? Authorizing Provider  benzonatate  (TESSALON ) 100 MG capsule Take 1 capsule (100 mg total) by mouth every 8 (eight) hours. 10/28/23  Yes Everardo Voris K, PA-C  methocarbamol  (ROBAXIN ) 500 MG tablet Take 1 tablet (500 mg total) by mouth 2 (two) times daily. 10/28/23  Yes Taiten Brawn K, PA-C  cetirizine  (ZYRTEC  ALLERGY) 10 MG tablet Take 1 tablet (10 mg total) by mouth daily. 10/28/23   Noboru Bidinger K, PA-C  hyoscyamine  (LEVSIN  SL) 0.125 MG SL tablet Take 1  tablet (0.125 mg total) by mouth every 4 (four) hours as needed for cramping. 04/01/23   Crain, Whitney L, PA  ibuprofen  (ADVIL ) 600 MG tablet Take 1 tablet (600 mg total) by mouth every 6 (six) hours as needed. Patient not taking: Reported on 10/28/2023 03/21/23   Christopher Savannah, PA-C  pseudoephedrine  (SUDAFED) 30 MG tablet Take 1 tablet (30 mg total) by mouth every 8 (eight) hours as needed for congestion. 03/21/23   Christopher Savannah, PA-C    Family History Family History  Problem Relation Age of Onset   Thyroid disease Mother    Carpal tunnel syndrome Mother    Thyroid nodules Mother    Hypertension Father    Diabetes Father    Stroke Father     Social History Social History   Tobacco Use   Smoking status: Every Day    Current packs/day: 0.25    Types: Cigarettes   Smokeless tobacco: Never  Vaping Use   Vaping status: Never Used  Substance Use Topics   Alcohol  use: Yes    Comment: occ   Drug use: Not Currently    Types: Marijuana, Cocaine    Comment: las Cocaine use Nov. 2016, last marijuana 02/22/15     Allergies   Latex   Review of Systems Review of Systems  Constitutional:  Positive for activity change, fatigue and fever. Negative for appetite change.  HENT:  Positive for congestion and sore throat. Negative for sinus pressure and sneezing.   Respiratory:  Positive for cough. Negative for shortness of breath.   Cardiovascular:  Negative for chest pain.  Gastrointestinal:  Negative for abdominal pain, diarrhea, nausea and vomiting.  Genitourinary:  Positive for vaginal bleeding (spotting). Negative for pelvic pain, vaginal discharge and vaginal pain.  Neurological:  Negative for dizziness, light-headedness and headaches.     Physical Exam Triage Vital Signs ED Triage Vitals [10/28/23 1426]  Encounter Vitals Group     BP 105/70     Girls Systolic BP Percentile      Girls Diastolic BP Percentile      Boys Systolic BP Percentile      Boys Diastolic BP Percentile       Pulse Rate 80     Resp 14     Temp 98.3 F (36.8 C)     Temp Source Oral     SpO2 96 %     Weight      Height      Head Circumference      Peak Flow      Pain  Score 8     Pain Loc      Pain Education      Exclude from Growth Chart    No data found.  Updated Vital Signs BP 105/70 (BP Location: Right Arm)   Pulse 80   Temp 98.3 F (36.8 C) (Oral)   Resp 14   SpO2 96%   Visual Acuity Right Eye Distance:   Left Eye Distance:   Bilateral Distance:    Right Eye Near:   Left Eye Near:    Bilateral Near:     Physical Exam Vitals reviewed.  Constitutional:      General: She is awake. She is not in acute distress.    Appearance: Normal appearance. She is well-developed. She is not ill-appearing.     Comments: Very pleasant female appears stated age in no acute distress sitting comfortably in exam room  HENT:     Head: Normocephalic and atraumatic.     Right Ear: Tympanic membrane, ear canal and external ear normal. Tympanic membrane is not erythematous or bulging.     Left Ear: Tympanic membrane, ear canal and external ear normal. Tympanic membrane is not erythematous or bulging.     Nose:     Right Sinus: No maxillary sinus tenderness or frontal sinus tenderness.     Left Sinus: No maxillary sinus tenderness or frontal sinus tenderness.     Mouth/Throat:     Pharynx: Uvula midline. No oropharyngeal exudate or posterior oropharyngeal erythema.  Cardiovascular:     Rate and Rhythm: Normal rate and regular rhythm.     Heart sounds: Normal heart sounds, S1 normal and S2 normal. No murmur heard. Pulmonary:     Effort: Pulmonary effort is normal.     Breath sounds: Examination of the right-lower field reveals decreased breath sounds. Examination of the left-lower field reveals decreased breath sounds. Decreased breath sounds present. No wheezing, rhonchi or rales.  Abdominal:     General: Bowel sounds are normal.     Palpations: Abdomen is soft.     Tenderness: There is no  abdominal tenderness. There is no right CVA tenderness, left CVA tenderness, guarding or rebound.  Musculoskeletal:     Cervical back: No tenderness or bony tenderness.     Thoracic back: No tenderness or bony tenderness.     Lumbar back: Tenderness present. No bony tenderness.     Comments: Back: Mild tenderness palpation of bilateral lumbar paraspinal muscles.  No deformity or step-off noted.  No pain percussion of vertebrae.  Strength 5/5 bilateral lower extremities.  Psychiatric:        Behavior: Behavior is cooperative.      UC Treatments / Results  Labs (all labs ordered are listed, but only abnormal results are displayed) Labs Reviewed  POCT URINE PREGNANCY  POC SARS CORONAVIRUS 2 AG -  ED  CERVICOVAGINAL ANCILLARY ONLY    EKG   Radiology DG Chest 2 View Result Date: 10/28/2023 CLINICAL DATA:  Cough for 4 days. EXAM: CHEST - 2 VIEW COMPARISON:  January 14, 2023. FINDINGS: The heart size and mediastinal contours are within normal limits. Both lungs are clear. The visualized skeletal structures are unremarkable. IMPRESSION: No active cardiopulmonary disease. Electronically Signed   By: Lynwood Landy Raddle M.D.   On: 10/28/2023 15:35    Procedures Procedures (including critical care time)  Medications Ordered in UC Medications - No data to display  Initial Impression / Assessment and Plan / UC Course  I have reviewed the triage vital  signs and the nursing notes.  Pertinent labs & imaging results that were available during my care of the patient were reviewed by me and considered in my medical decision making (see chart for details).     Patient is well-appearing, afebrile, nontoxic, nontachycardic.  I suspect vaginal spotting is related to initiation of Depo-Provera  and we discussed that this can take several cycles of the medication before it resolves/normalizes.  Urine pregnancy was obtained and was negative in clinic.  She reports very minimal spotting and so we did not  obtain blood work but she did report some discharge and so cervical vaginal swab was obtained.  We will defer treatment until results are available.  We discussed that if she has any worsening or changing symptoms she needs to be seen immediately.  No evidence of acute infection on physical exam that warrant initiation of antibiotics.  COVID testing was negative.  Chest x-ray was obtained that showed no acute cardiopulmonary disease.  She was encouraged to use over-the-counter medication such as Mucinex , Flonase , nasal saline/sinus rinses for symptom relief.  She was given cetirizine  as well as Tessalon  to help with symptomatic treatment.  We discussed that if she is not feeling better in a week or if anything worsens she needs to be seen immediately.  Strict return precautions given.  Excuse note provided.  Patient denies any alarm symptoms that warrant emergent evaluation/imaging of her lower back.  She did not have any bony tenderness and denies any recent trauma so plain films were deferred.  She is already taking high-dose anti-inflammatory medication was encouraged to continue this medicine but will also add Robaxin .  No indication for dose adjustment based on metabolic panel from 02/02/2022 with a creatinine of 0.88 and calculated creatinine clearance of 71 mL/min.  She is to use heat, rest, stretch for symptom relief.  Recommend she avoid anything strenuous including heavy lifting.  If she has any worsening or changing symptoms she is to return for reevaluation.  Excuse note provided.   Final Clinical Impressions(s) / UC Diagnoses   Final diagnoses:  Vaginal spotting  Acute cough  Viral URI with cough  Acute right-sided low back pain with right-sided sciatica     Discharge Instructions      You are negative for COVID.  Your chest x-ray did not show any evidence of pneumonia.  I suspect you have allergies versus a mild viral illness.  Use Tessalon  to help with your cough.  Take cetirizine   daily to help with the congestion.  I also recommend nasal saline sinus rinses.  You can use over-the-counter medicine such as Tylenol  and Mucinex .  You can use a humidifier in your room to help with the cough.  If you are not feeling better in a week or if anything worsens you need to be seen immediately including high fever, worsening cough, shortness of breath, chest pain.  Your urine pregnancy test was negative.  I will contact you if your swab is abnormal and changes her treatment plan.  I suspect your spotting is related to Depo-Provera .  If you have any heavy vaginal bleeding, pelvic pain, abdominal pain you should be reevaluated.  Continue the ibuprofen  that was previously prescribed to help with your back pain.  Take Robaxin  up to twice a day.  This will make you sleepy do not drive or drink alcohol  with taking it.  Use heat and gentle stretch for symptom relief.  If you have any worsening symptoms including weakness in your legs,  going to the [place] or cephalad noticing it, numbness or tingling in your legs you need to be seen immediately.     ED Prescriptions     Medication Sig Dispense Auth. Provider   cetirizine  (ZYRTEC  ALLERGY) 10 MG tablet Take 1 tablet (10 mg total) by mouth daily. 30 tablet Jashira Cotugno K, PA-C   benzonatate  (TESSALON ) 100 MG capsule Take 1 capsule (100 mg total) by mouth every 8 (eight) hours. 21 capsule Kinnick Maus K, PA-C   methocarbamol  (ROBAXIN ) 500 MG tablet Take 1 tablet (500 mg total) by mouth 2 (two) times daily. 20 tablet Keyle Doby K, PA-C      PDMP not reviewed this encounter.   Sherrell Rocky POUR, PA-C 10/28/23 1556

## 2023-10-28 NOTE — ED Triage Notes (Signed)
 Patient c/o bilateral lower back pain and a cough x 4 days. Patient states she has been lifting at her job. Patient states she has a history of sciatica  and does endorse the same today on the right side. Patient is wearing a right knee brace.  Patient also c/o right hand swelling.  Patient reports that she has been having intermittent spotting after receiving the depo injection. Patient states she has a smell down there like a fresh period.

## 2023-10-28 NOTE — Discharge Instructions (Signed)
 You are negative for COVID.  Your chest x-ray did not show any evidence of pneumonia.  I suspect you have allergies versus a mild viral illness.  Use Tessalon  to help with your cough.  Take cetirizine  daily to help with the congestion.  I also recommend nasal saline sinus rinses.  You can use over-the-counter medicine such as Tylenol  and Mucinex .  You can use a humidifier in your room to help with the cough.  If you are not feeling better in a week or if anything worsens you need to be seen immediately including high fever, worsening cough, shortness of breath, chest pain.  Your urine pregnancy test was negative.  I will contact you if your swab is abnormal and changes her treatment plan.  I suspect your spotting is related to Depo-Provera .  If you have any heavy vaginal bleeding, pelvic pain, abdominal pain you should be reevaluated.  Continue the ibuprofen  that was previously prescribed to help with your back pain.  Take Robaxin  up to twice a day.  This will make you sleepy do not drive or drink alcohol  with taking it.  Use heat and gentle stretch for symptom relief.  If you have any worsening symptoms including weakness in your legs, going to the [place] or cephalad noticing it, numbness or tingling in your legs you need to be seen immediately.

## 2023-10-30 ENCOUNTER — Ambulatory Visit (HOSPITAL_COMMUNITY): Payer: Self-pay

## 2023-10-30 LAB — CERVICOVAGINAL ANCILLARY ONLY
Bacterial Vaginitis (gardnerella): POSITIVE — AB
Candida Glabrata: NEGATIVE
Candida Vaginitis: NEGATIVE
Chlamydia: NEGATIVE
Comment: NEGATIVE
Comment: NEGATIVE
Comment: NEGATIVE
Comment: NEGATIVE
Comment: NEGATIVE
Comment: NORMAL
Neisseria Gonorrhea: NEGATIVE
Trichomonas: POSITIVE — AB

## 2023-10-30 MED ORDER — METRONIDAZOLE 500 MG PO TABS: 500.0000 mg | ORAL_TABLET | Freq: Two times a day (BID) | ORAL | 0 refills | Status: AC

## 2024-01-09 ENCOUNTER — Encounter (HOSPITAL_COMMUNITY): Payer: Self-pay | Admitting: *Deleted

## 2024-01-09 ENCOUNTER — Ambulatory Visit (HOSPITAL_COMMUNITY)
Admission: EM | Admit: 2024-01-09 | Discharge: 2024-01-09 | Disposition: A | Discharge status: Home / Self Care | Attending: Emergency Medicine | Admitting: Emergency Medicine

## 2024-01-09 ENCOUNTER — Other Ambulatory Visit: Payer: Self-pay

## 2024-01-09 DIAGNOSIS — G8929 Other chronic pain: Secondary | ICD-10-CM | POA: Diagnosis not present

## 2024-01-09 DIAGNOSIS — M545 Low back pain, unspecified: Secondary | ICD-10-CM | POA: Diagnosis not present

## 2024-01-09 DIAGNOSIS — M542 Cervicalgia: Secondary | ICD-10-CM

## 2024-01-09 MED ORDER — METHOCARBAMOL 500 MG PO TABS: 500.0000 mg | ORAL_TABLET | Freq: Two times a day (BID) | ORAL | 0 refills | Status: DC

## 2024-01-09 MED ORDER — DEXAMETHASONE SOD PHOSPHATE PF 10 MG/ML IJ SOLN
10.0000 mg | Freq: Once | INTRAMUSCULAR | Status: AC
  Administered 2024-01-09: 10 mg via INTRAMUSCULAR

## 2024-01-09 MED ORDER — NAPROXEN 500 MG PO TABS: 500.0000 mg | ORAL_TABLET | Freq: Two times a day (BID) | ORAL | 0 refills | Status: DC

## 2024-01-09 NOTE — Discharge Instructions (Signed)
 Follow-up with your primary care provider as scheduled.  We have given you a steroid shot in clinic that should help with inflammation.  You can take the naproxen  twice daily throughout the day.  Use the Robaxin  at night for muscle pain, this may cause sedation or drowsiness so use it sparingly.  I suspect you should follow-up with orthopedics for further evaluation if your chronic pain persist.

## 2024-01-09 NOTE — ED Triage Notes (Signed)
 Pt reports having back pian and burning in legs fork one week. Pt has tried OTC for pain with out relief.

## 2024-01-09 NOTE — ED Provider Notes (Signed)
 [facility]    CSN: [id number] Arrival date & time: 01/09/24  1537      History   Chief Complaint Chief Complaint  Patient presents with   Back Pain    HPI Lisa Holt is a 33 y.o. female.   Patient presents to clinic over multiple concerns.  The back pain and intermittent numbness have been ongoing for years.  Burning that starts in the glutes and goes down her legs  Right knee feels like it is 'going to come out of the socket'  Burning feeling is really hot, melting pain that does not stop, will pinch  Feels like she is not stepping properly   Mid back pain that goes into scapula  Right sided neck pain for years, started after neck surgery, neck pain with movement  Has been prescribed some nerve pain medication by her doctor, this is not helping, using this for about a week (gabapentin) Feels like she is sleeping too much as well, this is ongoing issue No trauma or falls  Feels like she cannot yawn all the way due to pinching feeling in back Denies urinary symptoms  Has been spotting, gets injections for contraception, last one 2-3 months ago  The history is provided by the patient and medical records.  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   GERD (gastroesophageal reflux disease)    Migraine with aura    Ovarian cyst    Post partum depression    was prescribed meds, took 2, then stopped   Thyroglossal cyst    Trichomonas infection 08/03/2022   needs test of cure   Vitamin D  deficiency     Patient Active Problem List   Diagnosis Date Noted   Bilateral hand pain 01/10/2021   Trigger finger of left hand 01/10/2021   Surveillance of previously prescribed implantable subdermal contraceptive 10/16/2019   Right ovarian cyst 09/18/2018   Chest pain 01/21/2018   Hypokalemia 01/21/2018   Hypomagnesemia 01/21/2018    Influenza due to influenza virus, type B 01/20/2018   HSIL (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 Authorizing Provider  methocarbamol  (ROBAXIN ) 500 MG tablet Take 1 tablet (500 mg total) by mouth 2 (two) times daily. 01/09/24  Yes Wilmary Levit  N, FNP  naproxen  (NAPROSYN ) 500 MG tablet Take 1 tablet (500 mg total) by mouth 2 (two) times daily. 01/09/24  Yes Dreama, Teniya Filter  LOISE, FNP  benzonatate  (TESSALON ) 100 MG capsule Take 1 capsule (100 mg total) by mouth every 8 (eight) hours. 10/28/23   Raspet, Erin K, PA-C  cetirizine  (ZYRTEC  ALLERGY) 10 MG tablet Take 1 tablet (10 mg total) by mouth daily. 10/28/23   Raspet, Erin K, PA-C  hyoscyamine  (LEVSIN  SL) 0.125 MG SL tablet Take 1 tablet (0.125 mg total) by mouth every 4 (four) hours as needed for cramping. 04/01/23   Crain, Whitney L, PA  ibuprofen  (ADVIL ) 600 MG tablet Take 1 tablet (600 mg total) by mouth every 6 (six) hours as needed. Patient not taking: Reported on 10/28/2023 03/21/23   Christopher Savannah, PA-C  pseudoephedrine  (SUDAFED) 30 MG tablet Take 1 tablet (30 mg total) by mouth every 8 (eight) hours as needed for congestion. 03/21/23   Christopher Savannah, PA-C    Family History Family History  Problem Relation Age of Onset   Thyroid disease Mother    Carpal tunnel syndrome Mother    Thyroid nodules Mother    Hypertension Father    Diabetes Father     Stroke Father     Social History Social History   Tobacco Use   Smoking status: Every Day    Current packs/day: 0.25    Types: Cigarettes   Smokeless tobacco: Never  Vaping Use   Vaping status: Never Used  Substance Use Topics   Alcohol  use: Yes    Comment: occ   Drug use: Not Currently    Types: Marijuana, Cocaine    Comment: las Cocaine use Nov. 2016, last marijuana 02/22/15     Allergies   Latex   Review of Systems Review of Systems  Per HPI  Physical Exam Triage Vital Signs ED Triage Vitals [01/09/24 1658]  Encounter Vitals Group     BP 95/66     Girls Systolic BP Percentile      Girls Diastolic BP Percentile      Boys Systolic BP Percentile      Boys Diastolic BP Percentile      Pulse Rate 97     Resp 20     Temp 98.6 F (37 C)     Temp src      SpO2 96 %     Weight      Height      Head Circumference      Peak Flow      Pain Score      Pain Loc      Pain Education      Exclude from Growth Chart    No data found.  Updated Vital Signs BP 95/66   Pulse 97   Temp 98.6 F (37 C)   Resp 20   LMP 12/09/2023 (Approximate)   SpO2 96%   Visual Acuity Right Eye Distance:   Left Eye Distance:   Bilateral Distance:    Right Eye Near:   Left Eye Near:    Bilateral Near:     Physical Exam Vitals and nursing note reviewed.  Constitutional:      Appearance: Normal appearance.  HENT:     Head: Normocephalic and atraumatic.     Right Ear: External ear normal.     Left Ear: External ear normal.     Nose: Nose normal.     Mouth/Throat:     Mouth: Mucous membranes are moist.  Eyes:     Conjunctiva/sclera: Conjunctivae normal.  Cardiovascular:     Rate and Rhythm: Normal rate.  Pulmonary:     Effort: Pulmonary effort  is normal. No respiratory distress.  Musculoskeletal:        General: Tenderness present. Normal range of motion.  Skin:    General: Skin is warm and dry.  Neurological:     General: No focal deficit present.     Mental  Status: She is alert and oriented to person, place, and time.  Psychiatric:        Mood and Affect: Mood normal.        Behavior: Behavior normal.      UC Treatments / Results  Labs (all labs ordered are listed, but only abnormal results are displayed) Labs Reviewed - No data to display  EKG   Radiology No results found.  Procedures Procedures (including critical care time)  Medications Ordered in UC Medications  dexamethasone  (DECADRON ) injection 10 mg (10 mg Intramuscular Given 01/09/24 1731)    Initial Impression / Assessment and Plan / UC Course  I have reviewed the triage vital signs and the nursing notes.  Pertinent labs & imaging results that were available during my care of the patient were reviewed by me and considered in my medical decision making (see chart for details).  Vitals and triage reviewed, patient is hemodynamically stable.  Atraumatic back pain.  Does radiate down both of legs, could be nerve pain.  Will trial IM Decadron .  Paraspinous muscles are tender to palpation along thoracic and cervical spine, worse on the right neck area.  Will trial muscle relaxer.  These are chronic issues for which are ongoing, does follow with PCP and has an appointment on Friday.  Orthopedics may be beneficial as well.  Plan of care, follow-up care return precautions given, no questions at this time.  Work note provided.    Final Clinical Impressions(s) / UC Diagnoses   Final diagnoses:  Chronic midline low back pain, unspecified whether sciatica present  Neck pain, musculoskeletal     Discharge Instructions      Follow-up with your primary care provider as scheduled.  We have given you a steroid shot in clinic that should help with inflammation.  You can take the naproxen  twice daily throughout the day.  Use the Robaxin  at night for muscle pain, this may cause sedation or drowsiness so use it sparingly.  I suspect you should follow-up with orthopedics for  further evaluation if your chronic pain persist.     ED Prescriptions     Medication Sig Dispense Auth. Provider   methocarbamol  (ROBAXIN ) 500 MG tablet Take 1 tablet (500 mg total) by mouth 2 (two) times daily. 20 tablet Dreama, Milo Schreier  N, FNP   naproxen  (NAPROSYN ) 500 MG tablet Take 1 tablet (500 mg total) by mouth 2 (two) times daily. 30 tablet Dreama Ermalinda SAILOR, FNP      PDMP not reviewed this encounter.   Dreama, Nikitta Sobiech  N, FNP 01/09/24 1749

## 2024-02-18 ENCOUNTER — Telehealth: Admitting: Family

## 2024-02-18 DIAGNOSIS — K0889 Other specified disorders of teeth and supporting structures: Secondary | ICD-10-CM

## 2024-02-18 NOTE — Progress Notes (Signed)
" °  Because of you dental pain and high fever, I feel your condition warrants further evaluation and I recommend that you be seen in a face-to-face visit.   NOTE: There will be NO CHARGE for this E-Visit   If you are having a true medical emergency, please call 911.     For an urgent face to face visit, [facility] has multiple urgent care centers for your convenience.  Click the link below for the full list of locations and hours, walk-in wait times, appointment scheduling options and driving directions:  Urgent Care - [place], [place], [place], [place], [place], [place]  [place]     Your MyChart E-visit questionnaire answers were reviewed by a board certified advanced clinical practitioner to complete your personal care plan based on your specific symptoms.    Thank you for using e-Visits.    "

## 2024-02-19 ENCOUNTER — Encounter (HOSPITAL_COMMUNITY): Payer: Self-pay | Admitting: Emergency Medicine

## 2024-02-19 ENCOUNTER — Ambulatory Visit (HOSPITAL_COMMUNITY)
Admission: EM | Admit: 2024-02-19 | Discharge: 2024-02-19 | Disposition: A | Discharge status: Home / Self Care | Attending: Internal Medicine | Admitting: Internal Medicine

## 2024-02-19 DIAGNOSIS — K047 Periapical abscess without sinus: Secondary | ICD-10-CM

## 2024-02-19 DIAGNOSIS — K0889 Other specified disorders of teeth and supporting structures: Secondary | ICD-10-CM | POA: Diagnosis not present

## 2024-02-19 DIAGNOSIS — R051 Acute cough: Secondary | ICD-10-CM

## 2024-02-19 DIAGNOSIS — R509 Fever, unspecified: Secondary | ICD-10-CM | POA: Diagnosis not present

## 2024-02-19 DIAGNOSIS — L509 Urticaria, unspecified: Secondary | ICD-10-CM | POA: Diagnosis not present

## 2024-02-19 LAB — POC COVID19/FLU A&B COMBO
Covid Antigen, POC: NEGATIVE
Influenza A Antigen, POC: NEGATIVE
Influenza B Antigen, POC: NEGATIVE

## 2024-02-19 MED ORDER — HYDROCORTISONE 2.5 % EX LOTN: TOPICAL_LOTION | Freq: Two times a day (BID) | CUTANEOUS | 0 refills | Status: AC | PRN

## 2024-02-19 MED ORDER — HYDROCODONE-ACETAMINOPHEN 5-325 MG PO TABS: 1.0000 | ORAL_TABLET | Freq: Four times a day (QID) | ORAL | 0 refills | Status: AC | PRN

## 2024-02-19 MED ORDER — KETOROLAC TROMETHAMINE 30 MG/ML IJ SOLN
30.0000 mg | Freq: Once | INTRAMUSCULAR | Status: AC
  Administered 2024-02-19: 30 mg via INTRAMUSCULAR

## 2024-02-19 MED ORDER — KETOROLAC TROMETHAMINE 30 MG/ML IJ SOLN
INTRAMUSCULAR | Status: AC
  Filled 2024-02-19: qty 1

## 2024-02-19 MED ORDER — AMOXICILLIN-POT CLAVULANATE 875-125 MG PO TABS: 1.0000 | ORAL_TABLET | Freq: Two times a day (BID) | ORAL | 0 refills | Status: AC

## 2024-02-19 MED ORDER — PROMETHAZINE-DM 6.25-15 MG/5ML PO SYRP: 5.0000 mL | ORAL_SOLUTION | Freq: Three times a day (TID) | ORAL | 0 refills | Status: DC | PRN

## 2024-02-19 MED ORDER — ONDANSETRON 4 MG PO TBDP: 4.0000 mg | ORAL_TABLET | Freq: Three times a day (TID) | ORAL | 0 refills | Status: AC | PRN

## 2024-02-19 NOTE — ED Provider Notes (Signed)
 " [facility]    CSN: [id number] Arrival date & time: 02/19/24  1721      History   Chief Complaint Chief Complaint  Patient presents with   Dental Pain    HPI Lisa Holt is a 33 y.o. female.   33 year old female presents urgent care with complaints of cough, fatigue, muscle aches and fevers, dental pain and mouth pain and itching behind the left ear.  He cough, fevers, fatigue and muscle aches started within the last several days.  She has been trying over-the-counter medication without relief.  She has not had any known exposures that she knows of.  She is having nausea associated with this.  The dental pain and mouth pain started on Christmas Day.  The pain is especially severe on the right lower molar where there appears to be a cracked tooth as well as the roof of the mouth on the right side.  She reports the pain is severe enough that she is not able to sleep.  She also relates that she has had itching behind her left ear for about 5 months now.  She did have a cyst removed from there by ENT and she did let them know that she was having the itching but they told her that this was normal.  The itching can be severe at times.   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   GERD (gastroesophageal reflux disease)    Migraine with aura    Ovarian cyst    Post partum depression    was prescribed meds, took 2, then stopped   Thyroglossal cyst    Trichomonas infection 08/03/2022   needs test of cure   Vitamin D  deficiency     Patient Active Problem List   Diagnosis Date Noted   Bilateral hand pain 01/10/2021   Trigger finger of left hand 01/10/2021   Surveillance of previously prescribed implantable subdermal contraceptive 10/16/2019   Right ovarian cyst 09/18/2018   Chest pain 01/21/2018   Hypokalemia  01/21/2018   Hypomagnesemia 01/21/2018   Influenza due to influenza virus, type B 01/20/2018   HSIL (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 08/06/2015    Past Surgical History:  Procedure Laterality Date   FOREIGN BODY REMOVAL Left 05/17/2022   Procedure: EXCISION FOREIGN BODY LEFT ARM;  Surgeon: Stevie, Herlene Righter, MD;  Location: [facility];  Service: General;  Laterality: Left;  LMA   WISDOM TOOTH EXTRACTION      OB History     Gravida  2   Para  2   Term  2   Preterm      AB      Living  2      SAB      IAB      Ectopic      Multiple  0   Live Births  2            Home Medications    Prior to Admission medications  Medication Sig Start Date End Date Taking? Authorizing Provider  amoxicillin -clavulanate (AUGMENTIN ) 875-125 MG tablet Take 1 tablet by mouth every 12 (twelve) hours for 10 days. 02/19/24 02/29/24 Yes Saahil Herbster A, PA-C  HYDROcodone -acetaminophen  (NORCO/VICODIN) 5-325 MG tablet Take 1-2 tablets  by mouth every 6 (six) hours as needed for severe pain (pain score 7-10). 02/19/24  Yes Adajah Cocking A, PA-C  hydrocortisone  2.5 % lotion Apply topically 2 (two) times daily as needed (itching). To area of concern behind the left ear 02/19/24  Yes Ysabel Cowgill A, PA-C  ondansetron  (ZOFRAN -ODT) 4 MG disintegrating tablet Take 1 tablet (4 mg total) by mouth every 8 (eight) hours as needed for nausea or vomiting. 02/19/24  Yes Shalev Helminiak A, PA-C  promethazine -dextromethorphan  (PROMETHAZINE -DM) 6.25-15 MG/5ML syrup Take 5 mLs by mouth every 8 (eight) hours as needed for cough. 02/19/24  Yes Teresa Almarie LABOR, PA-C    Family History Family History  Problem Relation Age of Onset   Thyroid disease Mother    Carpal tunnel syndrome Mother    Thyroid nodules Mother    Hypertension Father    Diabetes Father    Stroke Father     Social History Social History[1]   Allergies    Latex   Review of Systems Review of Systems  Constitutional:  Positive for fatigue and fever. Negative for chills.  HENT:  Positive for dental problem. Negative for ear pain and sore throat.   Eyes:  Negative for pain and visual disturbance.  Respiratory:  Positive for cough. Negative for shortness of breath.   Cardiovascular:  Negative for chest pain and palpitations.  Gastrointestinal:  Negative for abdominal pain and vomiting.  Genitourinary:  Negative for dysuria and hematuria.  Musculoskeletal:  Positive for myalgias. Negative for arthralgias and back pain.  Skin:  Positive for rash (Itching behind left ear). Negative for color change.  Neurological:  Negative for seizures and syncope.  All other systems reviewed and are negative.    Physical Exam Triage Vital Signs ED Triage Vitals  Encounter Vitals Group     BP 02/19/24 1833 113/76     Girls Systolic BP Percentile --      Girls Diastolic BP Percentile --      Boys Systolic BP Percentile --      Boys Diastolic BP Percentile --      Pulse Rate 02/19/24 1833 74     Resp 02/19/24 1833 16     Temp 02/19/24 1833 98.6 F (37 C)     Temp Source 02/19/24 1833 Oral     SpO2 02/19/24 1833 98 %     Weight --      Height --      Head Circumference --      Peak Flow --      Pain Score 02/19/24 1831 10     Pain Loc --      Pain Education --      Exclude from Growth Chart --    No data found.  Updated Vital Signs BP 113/76 (BP Location: Right Arm)   Pulse 74   Temp 98.6 F (37 C) (Oral)   Resp 16   SpO2 98%   Visual Acuity Right Eye Distance:   Left Eye Distance:   Bilateral Distance:    Right Eye Near:   Left Eye Near:    Bilateral Near:     Physical Exam Vitals and nursing note reviewed.  Constitutional:      General: She is not in acute distress.    Appearance: She is well-developed.  HENT:     Head: Normocephalic and atraumatic.      Right Ear: Tympanic membrane normal.     Left Ear: Tympanic  membrane normal.     Nose:  Nose normal.     Mouth/Throat:     Mouth: Mucous membranes are moist.   Eyes:     Conjunctiva/sclera: Conjunctivae normal.  Cardiovascular:     Rate and Rhythm: Normal rate and regular rhythm.     Heart sounds: No murmur heard. Pulmonary:     Effort: Pulmonary effort is normal. No respiratory distress.     Breath sounds: Normal breath sounds.  Abdominal:     Palpations: Abdomen is soft.     Tenderness: There is no abdominal tenderness.  Musculoskeletal:        General: No swelling.     Cervical back: Neck supple.  Skin:    General: Skin is warm and dry.     Capillary Refill: Capillary refill takes less than 2 seconds.  Neurological:     General: No focal deficit present.     Mental Status: She is alert.  Psychiatric:        Mood and Affect: Mood normal.      UC Treatments / Results  Labs (all labs ordered are listed, but only abnormal results are displayed) Labs Reviewed  POC COVID19/FLU A&B COMBO    EKG   Radiology No results found.  Procedures Procedures (including critical care time)  Medications Ordered in UC Medications  ketorolac  (TORADOL ) 30 MG/ML injection 30 mg (30 mg Intramuscular Given 02/19/24 1905)    Initial Impression / Assessment and Plan / UC Course  I have reviewed the triage vital signs and the nursing notes.  Pertinent labs & imaging results that were available during my care of the patient were reviewed by me and considered in my medical decision making (see chart for details).     Acute cough - Plan: POC Covid19/Flu A&B Antigen, POC Covid19/Flu A&B Antigen  Fever in adult - Plan: POC Covid19/Flu A&B Antigen, POC Covid19/Flu A&B Antigen  Dental infection  Pain, dental  Urticaria   Flu A, flu B and COVID testing done today due to fever and cough.  These are negative.  Symptoms and physical exam findings are most consistent with a viral infection.  This does not require antibiotics.  We will send in  medication to help with the symptoms.  We recommend the following: Promethazine  DM 5 mL every 8 hours as needed for cough.  Use caution as this medication can cause drowsiness. Zofran  4 mg orally disintegrating tablet every 8 hours as needed for nausea.  Rest and stay hydrated Return to urgent care or PCP if symptoms worsen or fail to resolve.    Dental and mouth symptoms are most consistent with fractured molar and dental infection.  This will likely need to be followed up with a dentist as more definitive treatment will be needed but in the interim we will treat with antibiotics by mouth and medication for pain. Augmentin  875 mg twice daily for 10 days.  This is an antibiotic.  Take this with food.  Toradol  injection given today. This is a medication to help with pain. This is not a narcotic.  Hydrocodone /acetaminophen  5/325 mg 1 to 2 tablets every 6 hours as needed for severe pain.  If you are having mild to moderate pain then recommend using ibuprofen  600 mg.  Do not take products containing Tylenol  while you are taking the hydrocodone . Schedule an appointment follow-up with a dentist  Area behind the left ear appears to be well-healed from the surgical procedure however there is a small area just behind the incision that appears to be another  cyst.  Recommend following up with the ENT providers for further evaluation of this.  In the interim for the itching we will prescribe hydrocortisone  twice daily to the affected area as needed for itching.  Final Clinical Impressions(s) / UC Diagnoses   Final diagnoses:  Acute cough  Fever in adult  Dental infection  Pain, dental  Urticaria     Discharge Instructions      Flu A, flu B and COVID testing done today due to fever and cough.  These are negative.  Symptoms and physical exam findings are most consistent with a viral infection.  This does not require antibiotics.  We will send in medication to help with the symptoms.  We recommend the  following: Promethazine  DM 5 mL every 8 hours as needed for cough.  Use caution as this medication can cause drowsiness. Zofran  4 mg orally disintegrating tablet every 8 hours as needed for nausea.  Rest and stay hydrated Return to urgent care or PCP if symptoms worsen or fail to resolve.    Dental and mouth symptoms are most consistent with fractured molar and dental infection.  This will likely need to be followed up with a dentist as more definitive treatment will be needed but in the interim we will treat with antibiotics by mouth and medication for pain. Augmentin  875 mg twice daily for 10 days.  This is an antibiotic.  Take this with food.  Toradol  injection given today. This is a medication to help with pain. This is not a narcotic.  Hydrocodone /acetaminophen  5/325 mg 1 to 2 tablets every 6 hours as needed for severe pain.  If you are having mild to moderate pain then recommend using ibuprofen  600 mg.  Do not take products containing Tylenol  while you are taking the hydrocodone . Schedule an appointment follow-up with a dentist  Area behind the left ear appears to be well-healed from the surgical procedure however there is a small area just behind the incision that appears to be another cyst.  Recommend following up with the ENT providers for further evaluation of this.  In the interim for the itching we will prescribe hydrocortisone  twice daily to the affected area as needed for itching.    ED Prescriptions     Medication Sig Dispense Auth. Provider   amoxicillin -clavulanate (AUGMENTIN ) 875-125 MG tablet Take 1 tablet by mouth every 12 (twelve) hours for 10 days. 20 tablet Teresa Almarie LABOR, PA-C   hydrocortisone  2.5 % lotion Apply topically 2 (two) times daily as needed (itching). To area of concern behind the left ear 59 mL Johnmatthew Solorio A, PA-C   HYDROcodone -acetaminophen  (NORCO/VICODIN) 5-325 MG tablet Take 1-2 tablets by mouth every 6 (six) hours as needed for severe pain (pain  score 7-10). 10 tablet Taegan Standage A, PA-C   ondansetron  (ZOFRAN -ODT) 4 MG disintegrating tablet Take 1 tablet (4 mg total) by mouth every 8 (eight) hours as needed for nausea or vomiting. 20 tablet Tytan Sandate A, PA-C   promethazine -dextromethorphan  (PROMETHAZINE -DM) 6.25-15 MG/5ML syrup Take 5 mLs by mouth every 8 (eight) hours as needed for cough. 180 mL Teresa Almarie LABOR, [place]      I have reviewed the PDMP during this encounter.    [1]  Social History Tobacco Use   Smoking status: Every Day    Current packs/day: 0.25    Types: Cigarettes   Smokeless tobacco: Never  Vaping Use   Vaping status: Never Used  Substance Use Topics   Alcohol  use: Yes  Comment: occ   Drug use: Not Currently    Types: Marijuana, Cocaine    Comment: las Cocaine use Nov. 2016, last marijuana 02/22/15     Teresa Almarie LABOR, PA-C 02/19/24 1943  "

## 2024-02-19 NOTE — Discharge Instructions (Addendum)
 Flu A, flu B and COVID testing done today due to fever and cough.  These are negative.  Symptoms and physical exam findings are most consistent with a viral infection.  This does not require antibiotics.  We will send in medication to help with the symptoms.  We recommend the following: Promethazine  DM 5 mL every 8 hours as needed for cough.  Use caution as this medication can cause drowsiness. Zofran  4 mg orally disintegrating tablet every 8 hours as needed for nausea.  Rest and stay hydrated Return to urgent care or PCP if symptoms worsen or fail to resolve.    Dental and mouth symptoms are most consistent with fractured molar and dental infection.  This will likely need to be followed up with a dentist as more definitive treatment will be needed but in the interim we will treat with antibiotics by mouth and medication for pain. Augmentin  875 mg twice daily for 10 days.  This is an antibiotic.  Take this with food.  Toradol  injection given today. This is a medication to help with pain. This is not a narcotic.  Hydrocodone /acetaminophen  5/325 mg 1 to 2 tablets every 6 hours as needed for severe pain.  If you are having mild to moderate pain then recommend using ibuprofen  600 mg.  Do not take products containing Tylenol  while you are taking the hydrocodone . Schedule an appointment follow-up with a dentist  Area behind the left ear appears to be well-healed from the surgical procedure however there is a small area just behind the incision that appears to be another cyst.  Recommend following up with the ENT providers for further evaluation of this.  In the interim for the itching we will prescribe hydrocortisone  twice daily to the affected area as needed for itching.

## 2024-02-19 NOTE — ED Triage Notes (Signed)
 Pt c/o right dental pain since 12/25.  Pt reports having irritation behind left ear causing itching for 5 months.

## 2024-03-12 ENCOUNTER — Telehealth: Admitting: Family Medicine

## 2024-03-12 DIAGNOSIS — K047 Periapical abscess without sinus: Secondary | ICD-10-CM

## 2024-03-12 MED ORDER — PENICILLIN V POTASSIUM 500 MG PO TABS: 500.0000 mg | ORAL_TABLET | Freq: Four times a day (QID) | ORAL | 0 refills | Status: AC

## 2024-03-12 NOTE — Progress Notes (Signed)
 " Virtual Visit Consent   Lisa Holt, you are scheduled for a virtual visit with a [facility] provider today. Just as with appointments in the office, your consent must be obtained to participate. Your consent will be active for this visit and any virtual visit you may have with one of our providers in the next 365 days. If you have a MyChart account, a copy of this consent can be sent to you electronically.  As this is a virtual visit, video technology does not allow for your provider to perform a traditional examination. This may limit your provider's ability to fully assess your condition. If your provider identifies any concerns that need to be evaluated in person or the need to arrange testing (such as labs, EKG, etc.), we will make arrangements to do so. Although advances in technology are sophisticated, we cannot ensure that it will always work on either your end or our end. If the connection with a video visit is poor, the visit may have to be switched to a telephone visit. With either a video or telephone visit, we are not always able to ensure that we have a secure connection.  By engaging in this virtual visit, you consent to the provision of healthcare and authorize for your insurance to be billed (if applicable) for the services provided during this visit. Depending on your insurance coverage, you may receive a charge related to this service.  I need to obtain your verbal consent now. Are you willing to proceed with your visit today? Lisa Holt has provided verbal consent on 03/12/2024 for a virtual visit (video or telephone). [name], FNP  Date: 03/12/2024 7:41 PM   Virtual Visit via Video Note   I, [name], connected with  Lisa Holt  ([id number], 1990-08-14) on 03/12/24 at  7:45 PM EST by a video-enabled telemedicine application and verified that I am speaking with the correct person using two identifiers.  Location: Patient: Virtual Visit Location Patient:  Home Provider: Virtual Visit Location Provider: Home Office   I discussed the limitations of evaluation and management by telemedicine and the availability of in person appointments. The patient expressed understanding and agreed to proceed.    History of Present Illness: Lisa Holt is a 34 y.o. who identifies as a female who was assigned female at birth, and is being seen today for right bottom dental pain. She says it is a crown and has had problems before. It started hurting today and was swollen this am. She will call for apptmt with dentist in am.  No fever. [name]  HPI: HPI  Problems:  Patient Active Problem List   Diagnosis Date Noted   Bilateral hand pain 01/10/2021   Trigger finger of left hand 01/10/2021   Surveillance of previously prescribed implantable subdermal contraceptive 10/16/2019   Right ovarian cyst 09/18/2018   Chest pain 01/21/2018   Hypokalemia 01/21/2018   Hypomagnesemia 01/21/2018   Influenza due to influenza virus, type B 01/20/2018   HSIL (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 08/06/2015    Allergies: Allergies[1] Medications: Current Medications[2]  Observations/Objective: Patient is well-developed, well-nourished in no acute distress.  Resting comfortably  at home.  Head is normocephalic, atraumatic.  No labored breathing.  Speech is clear and coherent with logical content.  Patient is alert and oriented at baseline.    Assessment and Plan: 1. Dental infection (Primary)  Follow up with dentist.   Follow Up Instructions: I discussed the  assessment and treatment plan with the patient. The patient was provided an opportunity to ask questions and all were answered. The patient agreed with the plan and demonstrated an understanding of the instructions.  A copy of instructions were sent to the patient via MyChart unless otherwise noted below.     The patient was advised to call back or seek an in-person evaluation if the  symptoms worsen or if the condition fails to improve as anticipated.    [name], FNP     [1]  Allergies Allergen Reactions   Latex Itching and Swelling  [2]  Current Outpatient Medications:    HYDROcodone -acetaminophen  (NORCO/VICODIN) 5-325 MG tablet, Take 1-2 tablets by mouth every 6 (six) hours as needed for severe pain (pain score 7-10)., Disp: 10 tablet, Rfl: 0   hydrocortisone  2.5 % lotion, Apply topically 2 (two) times daily as needed (itching). To area of concern behind the left ear, Disp: 59 mL, Rfl: 0   ondansetron  (ZOFRAN -ODT) 4 MG disintegrating tablet, Take 1 tablet (4 mg total) by mouth every 8 (eight) hours as needed for nausea or vomiting., Disp: 20 tablet, Rfl: 0   promethazine -dextromethorphan  (PROMETHAZINE -DM) 6.25-15 MG/5ML syrup, Take 5 mLs by mouth every 8 (eight) hours as needed for cough., Disp: 180 mL, Rfl: 0  "

## 2024-03-12 NOTE — Patient Instructions (Signed)
 Dental Abscess  A dental abscess is an infection around a tooth that may involve pain, swelling, and a collection of pus, as well as other symptoms. Treatment is important to help with symptoms and to prevent the infection from spreading. The general types of dental abscesses are: Pulpal abscess. This abscess may form from the inner part of the tooth (pulp). Periodontal abscess. This abscess may form from the gum. What are the causes? This condition is caused by a bacterial infection in or around the tooth. It may result from: Severe tooth decay (cavities). Trauma to the tooth, such as a broken or chipped tooth. What increases the risk? This condition is more likely to develop in males. It is also more likely to develop in people who: Have cavities. Have severe gum disease. Eat sugary snacks between meals. Use tobacco products. Have diabetes. Have a weakened disease-fighting system (immune system). Do not brush and care for their teeth regularly. What are the signs or symptoms? Mild symptoms of this condition include: Tenderness. Bad breath. Fever. A bitter taste in the mouth. Pain in and around the infected tooth. Moderate symptoms of this condition include: Swollen neck glands. Chills. Pus drainage. Swelling and redness around the infected tooth, in the mouth, or in the face. Severe pain in and around the infected tooth. Severe symptoms of this condition include: Difficulty swallowing. Difficulty opening the mouth. Nausea. Vomiting. How is this diagnosed? This condition is diagnosed based on: Your symptoms and your medical and dental history. An examination of the infected tooth. During the exam, your dental care provider may tap on the infected tooth. You may also need to have X-rays taken of the affected area. How is this treated? This condition is treated by getting rid of the infection. This may be done with: Antibiotic medicines. These may be used in certain  situations. Antibacterial mouth rinse. Incision and drainage. This procedure is done by making an incision in the abscess to drain out the pus. Removing pus is the first priority in treating an abscess. A root canal. This may be performed to save the tooth. Your dental care provider accesses the visible part of your tooth (crown) with a drill and removes any infected pulp. Then the space is filled and sealed off. Tooth extraction. The tooth is pulled out if it cannot be saved by other treatment. You may also receive treatment for pain, such as: Acetaminophen or NSAIDs. Gels that contain a numbing medicine. An injection to block the pain near your nerve. Follow these instructions at home: Medicines Take over-the-counter and prescription medicines only as told by your dental care provider. If you were prescribed an antibiotic, take it as told by your dental care provider. Do not stop taking the antibiotic even if you start to feel better. If you were prescribed a gel that contains a numbing medicine, use it exactly as told in the directions. Do not use these gels for children who are younger than 80 years of age. Use an antibacterial mouth rinse as told by your dental care provider. General instructions  Gargle with a mixture of salt and water 3-4 times a day or as needed. To make salt water, completely dissolve -1 tsp (3-6 g) of salt in 1 cup (237 mL) of warm water. Eat a soft diet while your abscess is healing. Drink enough fluid to keep your urine pale yellow. Do not apply heat to the outside of your mouth. Do not use any products that contain nicotine or tobacco. These  products include cigarettes, chewing tobacco, and vaping devices, such as e-cigarettes. If you need help quitting, ask your dental care provider. Keep all follow-up visits. This is important. How is this prevented?  Excellent dental home care, which includes brushing your teeth every morning and night with fluoride  toothpaste. Floss one time each day. Get regularly scheduled dental cleanings. Consider having a dental sealant applied on teeth that have deep grooves to prevent cavities. Drink fluoridated water regularly. This includes most tap water. Check the label on bottled water to see if it contains fluoride. Reduce or eliminate sugary drinks. Eat healthy meals and snacks. Wear a mouth guard or face shield to protect your teeth while playing sports. Contact a health care provider if: Your pain is worse and is not helped by medicine. You have swelling. You see pus around the tooth. You have a fever or chills. Get help right away if: Your symptoms suddenly get worse. You have a very bad headache. You have problems breathing or swallowing. You have trouble opening your mouth. You have swelling in your neck or around your eye. These symptoms may represent a serious problem that is an emergency. Do not wait to see if the symptoms will go away. Get medical help right away. Call your local emergency services (911 in the [place] Do not drive yourself to the hospital. Summary A dental abscess is a collection of pus in or around a tooth that results from an infection. A dental abscess may result from severe tooth decay, trauma to the tooth, or severe gum disease around a tooth. Symptoms include severe pain, swelling, redness, and drainage of pus in and around the infected tooth. The first priority in treating a dental abscess is to drain out the pus. Treatment may also involve removing damage inside the tooth (root canal) or extracting the tooth. This information is not intended to replace advice given to you by your health care provider. Make sure you discuss any questions you have with your health care provider. Document Revised: 04/16/2020 Document Reviewed: 04/16/2020 Elsevier Patient Education  2024 [employer].

## 2024-03-18 ENCOUNTER — Telehealth: Admitting: Family Medicine

## 2024-03-18 DIAGNOSIS — J209 Acute bronchitis, unspecified: Secondary | ICD-10-CM

## 2024-03-18 MED ORDER — BENZONATATE 100 MG PO CAPS: 100.0000 mg | ORAL_CAPSULE | Freq: Two times a day (BID) | ORAL | 0 refills | Status: AC | PRN

## 2024-03-18 MED ORDER — PREDNISONE 10 MG (21) PO TBPK: ORAL_TABLET | ORAL | 0 refills | Status: AC

## 2024-03-18 NOTE — Progress Notes (Signed)
 We are sorry that you are not feeling well.  Here is how we plan to help!  Based on your presentation I believe you most likely have A cough due to a virus.  This is called viral bronchitis and is best treated by rest, plenty of fluids and control of the cough.  You may use Ibuprofen or Tylenol as directed to help your symptoms.     In addition you may use A non-prescription cough medication called Robitussin DAC. Take 2 teaspoons every 8 hours or Delsym: take 2 teaspoons every 12 hours., A non-prescription cough medication called Mucinex  DM: take 2 tablets every 12 hours., and A prescription cough medication called Tessalon  Perles 100mg . You may take 1-2 capsules every 8 hours as needed for your cough.  Prednisone  10 mg daily for 6 days (see taper instructions below)  Directions for 6 day taper: Day 1: 2 tablets before breakfast, 1 after both lunch & dinner and 2 at bedtime Day 2: 1 tab before breakfast, 1 after both lunch & dinner and 2 at bedtime Day 3: 1 tab at each meal & 1 at bedtime Day 4: 1 tab at breakfast, 1 at lunch, 1 at bedtime Day 5: 1 tab at breakfast & 1 tab at bedtime Day 6: 1 tab at breakfast  From your responses in the eVisit questionnaire you describe inflammation in the upper respiratory tract which is causing a significant cough.  This is commonly called Bronchitis and has four common causes:   Allergies Viral Infections Acid Reflux Bacterial Infection Allergies, viruses and acid reflux are treated by controlling symptoms or eliminating the cause. An example might be a cough caused by taking certain blood pressure medications. You stop the cough by changing the medication. Another example might be a cough caused by acid reflux. Controlling the reflux helps control the cough.  USE OF BRONCHODILATOR (RESCUE) INHALERS: There is a risk from using your bronchodilator too frequently.  The risk is that over-reliance on a medication which only relaxes the muscles surrounding  the breathing tubes can reduce the effectiveness of medications prescribed to reduce swelling and congestion of the tubes themselves.  Although you feel brief relief from the bronchodilator inhaler, your asthma may actually be worsening with the tubes becoming more swollen and filled with mucus.  This can delay other crucial treatments, such as oral steroid medications. If you need to use a bronchodilator inhaler daily, several times per day, you should discuss this with your provider.  There are probably better treatments that could be used to keep your asthma under control.     HOME CARE Only take medications as instructed by your medical team. Complete the entire course of an antibiotic. Drink plenty of fluids and get plenty of rest. Avoid close contacts especially the very young and the elderly Cover your mouth if you cough or cough into your sleeve. Always remember to wash your hands A steam or ultrasonic humidifier can help congestion.   GET HELP RIGHT AWAY IF: You develop worsening fever. You become short of breath You cough up blood. Your symptoms persist after you have completed your treatment plan MAKE SURE YOU  Understand these instructions. Will watch your condition. Will get help right away if you are not doing well or get worse.  Your e-visit answers were reviewed by a board certified advanced clinical practitioner to complete your personal care plan.  Depending on the condition, your plan could have included both over the counter or prescription medications. If there  is a problem please reply  once you have received a response from your provider. Your safety is important to us .  If you have drug allergies check your prescription carefully.    You can use MyChart to ask questions about today's visit, request a non-urgent call back, or ask for a work or school excuse for 24 hours related to this e-Visit. If it has been greater than 24 hours you will need to follow up with your  provider, or enter a new e-Visit to address those concerns. You will get an e-mail in the next two days asking about your experience.  I hope that your e-visit has been valuable and will speed your recovery. Thank you for using e-visits.   I have spent 5 minutes in review of e-visit questionnaire, review and updating patient chart, medical decision making and response to patient.   Bari Learn, FNP

## 2024-03-19 ENCOUNTER — Encounter (HOSPITAL_COMMUNITY): Payer: Self-pay

## 2024-03-19 ENCOUNTER — Ambulatory Visit (HOSPITAL_COMMUNITY)

## 2024-03-19 ENCOUNTER — Ambulatory Visit (HOSPITAL_COMMUNITY)
Admission: RE | Admit: 2024-03-19 | Discharge: 2024-03-19 | Disposition: A | Discharge status: Home / Self Care | Source: Ambulatory Visit | Attending: Emergency Medicine | Admitting: Emergency Medicine

## 2024-03-19 VITALS — BP 121/85 | HR 94 | Temp 99.1°F | Resp 17

## 2024-03-19 DIAGNOSIS — N949 Unspecified condition associated with female genital organs and menstrual cycle: Secondary | ICD-10-CM | POA: Insufficient documentation

## 2024-03-19 DIAGNOSIS — K649 Unspecified hemorrhoids: Secondary | ICD-10-CM | POA: Insufficient documentation

## 2024-03-19 DIAGNOSIS — R051 Acute cough: Secondary | ICD-10-CM | POA: Insufficient documentation

## 2024-03-19 LAB — POC SOFIA SARS ANTIGEN FIA: SARS Coronavirus 2 Ag: NEGATIVE

## 2024-03-19 LAB — POCT INFLUENZA A/B
Influenza A, POC: NEGATIVE
Influenza B, POC: NEGATIVE

## 2024-03-19 LAB — POCT URINE PREGNANCY: Preg Test, Ur: NEGATIVE

## 2024-03-19 MED ORDER — DOCUSATE SODIUM 100 MG PO CAPS: 100.0000 mg | ORAL_CAPSULE | Freq: Two times a day (BID) | ORAL | 0 refills | Status: AC

## 2024-03-19 MED ORDER — HYDROCORTISONE (PERIANAL) 2.5 % EX CREA: 1.0000 | TOPICAL_CREAM | Freq: Two times a day (BID) | CUTANEOUS | 0 refills | Status: AC

## 2024-03-19 MED ORDER — PROMETHAZINE-DM 6.25-15 MG/5ML PO SYRP: 5.0000 mL | ORAL_SOLUTION | Freq: Four times a day (QID) | ORAL | 0 refills | Status: AC | PRN

## 2024-03-19 NOTE — Discharge Instructions (Addendum)
 COVID and flu testing were negative, you most likely have a different viral illness Use the cough medicine as needed, up to 4 times daily.  This will help loosen secretions but may cause drowsiness.  Do not drink alcohol  or drive on this medication. Alternate 650 mg of Tylenol  and 800 mg of  ibuprofen  every 4-6 hours to help with fever, body aches and chills  Urine pregnancy was negative We have tested you for vaginal infections and we will call over the next few days if treatment is needed.  Abstain from intercourse until all results have been received  For hemorrhoids use a daily stool softener such as Colace.  You can apply the hydrocortisone  cream daily for hemorrhoids

## 2024-03-19 NOTE — ED Provider Notes (Addendum)
 " [facility]    CSN: [id number] Arrival date & time: 03/19/24  1734      History   Chief Complaint Chief Complaint  Lisa Holt presents with   Cough    Ect - Entered by Lisa Holt    HPI Lisa Holt is a 34 y.o. female.   Presents to clinic over concerns of cough x 4 days   Endorses cough episodes with a deep cough  Having post-tussive emesis  Lisa Holt is fatigued w/ hot and cold chills  Having back spasms, congestion  Having a hard time breathing and catching her breath at night  Has not had medications or interventions PTA  Is also concerned because Lisa Holt feels another hemorrhoid coming on.  Lisa Holt does not have any more over-the-counter hydrocortisone  ointment.  Has also had some vaginal discomfort.  Lisa Holt has had recent unprotected intercourse with a consistent partner.  Is concerned that Lisa Holt might be pregnant, most recent menstrual cycle was February 09, 2024.  The history is provided by the Lisa Holt and medical records.  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   GERD (gastroesophageal reflux disease)    Migraine with aura    Ovarian cyst    Post partum depression    was prescribed meds, took 2, then stopped   Thyroglossal cyst    Trichomonas infection 08/03/2022   needs test of cure   Vitamin D  deficiency     Lisa Holt Active Problem List   Diagnosis Date Noted   Bilateral hand pain 01/10/2021   Trigger finger of left hand 01/10/2021   Surveillance of previously prescribed implantable subdermal contraceptive 10/16/2019   Right ovarian cyst 09/18/2018   Chest pain 01/21/2018   Hypokalemia 01/21/2018   Hypomagnesemia 01/21/2018   Influenza due to influenza virus, type B 01/20/2018   HSIL (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 08/06/2015    Past Surgical History:  Procedure Laterality Date   FOREIGN BODY  REMOVAL Left 05/17/2022   Procedure: EXCISION FOREIGN BODY LEFT ARM;  Surgeon: Stevie, Herlene Righter, MD;  Location: [facility] [place];  Service: General;  Laterality: Left;  LMA   WISDOM TOOTH EXTRACTION      OB History     Gravida  2   Para  2   Term  2   Preterm      AB      Living  2      SAB      IAB      Ectopic      Multiple  0   Live Births  2            Home Medications    Prior to Admission medications  Medication Sig Start Date End Date Taking? Authorizing Provider  docusate sodium  (COLACE) 100 MG capsule Take 1 capsule (100 mg total) by mouth every 12 (twelve) hours. 03/19/24  Yes Ball, Lynnex Fulp  G, FNP  hydrocortisone  (ANUSOL -HC) 2.5 % rectal cream Place 1 Application rectally 2 (two) times daily. 03/19/24  Yes Ball, Laryn Venning  G, FNP  promethazine -dextromethorphan  (PROMETHAZINE -DM) 6.25-15 MG/5ML syrup Take 5 mLs by mouth 4 (four) times daily as needed for cough. 03/19/24  Yes Ball, Nicolo Tomko  G, FNP  benzonatate  (TESSALON ) 100 MG capsule Take 1 capsule (100 mg total) by mouth 2 (two) times daily as  needed for cough. 03/18/24   Lavell Bari LABOR, FNP  HYDROcodone -acetaminophen  (NORCO/VICODIN) 5-325 MG tablet Take 1-2 tablets by mouth every 6 (six) hours as needed for severe pain (pain score 7-10). 02/19/24   White, Elizabeth A, PA-C  hydrocortisone  2.5 % lotion Apply topically 2 (two) times daily as needed (itching). To area of concern behind the left ear 02/19/24   Teresa Almarie LABOR, PA-C  ondansetron  (ZOFRAN -ODT) 4 MG disintegrating tablet Take 1 tablet (4 mg total) by mouth every 8 (eight) hours as needed for nausea or vomiting. 02/19/24   Teresa Almarie LABOR, PA-C  penicillin  v potassium (VEETID) 500 MG tablet Take 1 tablet (500 mg total) by mouth 4 (four) times daily for 10 days. 03/12/24 03/22/24  Blair, Diane W, FNP  predniSONE  (STERAPRED UNI-PAK 21 TAB) 10 MG (21) TBPK tablet Use as directed 03/18/24   Lavell Bari LABOR, FNP    Family History Family  History  Problem Relation Age of Onset   Thyroid disease Mother    Carpal tunnel syndrome Mother    Thyroid nodules Mother    Hypertension Father    Diabetes Father    Stroke Father     Social History Social History[1]   Allergies   Latex   Review of Systems Review of Systems  Per HPI  Physical Exam Triage Vital Signs ED Triage Vitals  Encounter Vitals Group     BP 03/19/24 1753 121/85     Girls Systolic BP Percentile --      Girls Diastolic BP Percentile --      Boys Systolic BP Percentile --      Boys Diastolic BP Percentile --      Pulse Rate 03/19/24 1751 94     Resp 03/19/24 1751 17     Temp 03/19/24 1751 99.1 F (37.3 C)     Temp Source 03/19/24 1751 Oral     SpO2 03/19/24 1751 98 %     Weight --      Height --      Head Circumference --      Peak Flow --      Pain Score 03/19/24 1750 3     Pain Loc --      Pain Education --      Exclude from Growth Chart --    No data found.  Updated Vital Signs BP 121/85   Pulse 94   Temp 99.1 F (37.3 C) (Oral)   Resp 17   LMP 02/09/2024 (Exact Date)   SpO2 98%   Visual Acuity Right Eye Distance:   Left Eye Distance:   Bilateral Distance:    Right Eye Near:   Left Eye Near:    Bilateral Near:     Physical Exam Vitals and nursing note reviewed.  Constitutional:      Appearance: Normal appearance.  HENT:     Head: Normocephalic and atraumatic.     Right Ear: External ear normal.     Left Ear: External ear normal.     Nose: Nose normal.     Mouth/Throat:     Mouth: Mucous membranes are moist.  Eyes:     Conjunctiva/sclera: Conjunctivae normal.  Cardiovascular:     Rate and Rhythm: Normal rate and regular rhythm.     Heart sounds: Normal heart sounds. No murmur heard. Pulmonary:     Effort: Pulmonary effort is normal. No respiratory distress.     Breath sounds: Normal breath sounds. No wheezing.  Musculoskeletal:  General: Normal range of motion.  Skin:    General: Skin is warm and  dry.  Neurological:     General: No focal deficit present.     Mental Status: Lisa Holt is alert.  Psychiatric:        Mood and Affect: Mood normal.        Behavior: Behavior is cooperative.      UC Treatments / Results  Labs (all labs ordered are listed, but only abnormal results are displayed) Labs Reviewed  POC SOFIA SARS ANTIGEN FIA  POCT INFLUENZA A/B  POCT URINE PREGNANCY  CERVICOVAGINAL ANCILLARY ONLY    EKG   Radiology No results found.  Procedures Procedures (including critical care time)  Medications Ordered in UC Medications - No data to display  Initial Impression / Assessment and Plan / UC Course  I have reviewed the triage vital signs and the nursing notes.  Pertinent labs & imaging results that were available during my care of the Lisa Holt were reviewed by me and considered in my medical decision making (see chart for details).  Vitals and triage reviewed, Lisa Holt is hemodynamically stable.  Lungs vesicular, heart with regular rate and rhythm.  Dry cough on exam.  Able to speak in full sentences.  98% on room air, no acute distress.  COVID and flu testing negative, suspect viral etiology to cough.  Chest x-ray deferred at this time.  Some vaginal irritation, cytology swab obtained.  Urine pregnancy negative.  Symptomatic management for hemorrhoids discussed.  Plan of care, follow-up care, and return precautions given, no questions at this time.     Final Clinical Impressions(s) / UC Diagnoses   Final diagnoses:  Acute cough  Vaginal discomfort  Hemorrhoids, unspecified hemorrhoid type     Discharge Instructions      COVID and flu testing were negative, you most likely have a different viral illness Use the cough medicine as needed, up to 4 times daily.  This will help loosen secretions but may cause drowsiness.  Do not drink alcohol  or drive on this medication. Alternate 650 mg of Tylenol  and 800 mg of  ibuprofen  every 4-6 hours to help with  fever, body aches and chills  Urine pregnancy was negative We have tested you for vaginal infections and we will call over the next few days if treatment is needed.  Abstain from intercourse until all results have been received  For hemorrhoids use a daily stool softener such as Colace.  You can apply the hydrocortisone  cream daily for hemorrhoids      ED Prescriptions     Medication Sig Dispense Auth. Provider   hydrocortisone  (ANUSOL -HC) 2.5 % rectal cream Place 1 Application rectally 2 (two) times daily. 30 g Ball, Frankey Botting  G, FNP   docusate sodium  (COLACE) 100 MG capsule Take 1 capsule (100 mg total) by mouth every 12 (twelve) hours. 60 capsule Ball, Jedd Schulenburg  G, FNP   promethazine -dextromethorphan  (PROMETHAZINE -DM) 6.25-15 MG/5ML syrup Take 5 mLs by mouth 4 (four) times daily as needed for cough. 118 mL Ball, Remington Skalsky  G, FNP      PDMP not reviewed this encounter.    Mercer, Reegan Bouffard  KANDICE, FNP 03/19/24 1832     [1]  Social History Tobacco Use   Smoking status: Every Day    Current packs/day: 0.25    Types: Cigarettes   Smokeless tobacco: Never  Vaping Use   Vaping status: Never Used  Substance Use Topics   Alcohol  use: Yes    Comment: occ   Drug use:  Not Currently    Types: Marijuana, Cocaine    Comment: las Cocaine use Nov. 2016, last marijuana 02/22/15     Mercer Archie MATSU, FNP 03/19/24 1835  "

## 2024-03-19 NOTE — ED Triage Notes (Signed)
 Pt present with a cough x Saturday. States the cough is worsening and causing back pains. Pt states she has a headache, denies fever.

## 2024-03-20 LAB — CERVICOVAGINAL ANCILLARY ONLY
Bacterial Vaginitis (gardnerella): POSITIVE — AB
Candida Glabrata: NEGATIVE
Candida Vaginitis: POSITIVE — AB
Chlamydia: NEGATIVE
Comment: NEGATIVE
Comment: NEGATIVE
Comment: NEGATIVE
Comment: NEGATIVE
Comment: NEGATIVE
Comment: NORMAL
Neisseria Gonorrhea: NEGATIVE
Trichomonas: POSITIVE — AB

## 2024-03-24 ENCOUNTER — Ambulatory Visit (HOSPITAL_COMMUNITY): Payer: Self-pay

## 2024-03-24 MED ORDER — FLUCONAZOLE 150 MG PO TABS: 150.0000 mg | ORAL_TABLET | Freq: Once | ORAL | 0 refills | Status: AC

## 2024-03-24 MED ORDER — METRONIDAZOLE 500 MG PO TABS: 500.0000 mg | ORAL_TABLET | Freq: Two times a day (BID) | ORAL | 0 refills | Status: AC
